# Patient Record
Sex: Female | Born: 1960
Health system: Southern US, Community
[De-identification: ages and names within clinical notes are randomized; demographics above are authoritative.]

## PROBLEM LIST (undated history)

## (undated) DIAGNOSIS — F329 Major depressive disorder, single episode, unspecified: Secondary | ICD-10-CM

## (undated) DIAGNOSIS — I10 Essential (primary) hypertension: Secondary | ICD-10-CM

## (undated) DIAGNOSIS — E039 Hypothyroidism, unspecified: Secondary | ICD-10-CM

## (undated) DIAGNOSIS — R319 Hematuria, unspecified: Secondary | ICD-10-CM

## (undated) DIAGNOSIS — D649 Anemia, unspecified: Secondary | ICD-10-CM

## (undated) DIAGNOSIS — Z8679 Personal history of other diseases of the circulatory system: Secondary | ICD-10-CM

## (undated) DIAGNOSIS — Z8669 Personal history of other diseases of the nervous system and sense organs: Secondary | ICD-10-CM

## (undated) DIAGNOSIS — G4730 Sleep apnea, unspecified: Secondary | ICD-10-CM

## (undated) DIAGNOSIS — Z8489 Family history of other specified conditions: Secondary | ICD-10-CM

## (undated) DIAGNOSIS — N201 Calculus of ureter: Secondary | ICD-10-CM

## (undated) DIAGNOSIS — K219 Gastro-esophageal reflux disease without esophagitis: Secondary | ICD-10-CM

## (undated) DIAGNOSIS — N200 Calculus of kidney: Secondary | ICD-10-CM

## (undated) DIAGNOSIS — M549 Dorsalgia, unspecified: Secondary | ICD-10-CM

## (undated) DIAGNOSIS — E079 Disorder of thyroid, unspecified: Secondary | ICD-10-CM

## (undated) DIAGNOSIS — Z87440 Personal history of urinary (tract) infections: Secondary | ICD-10-CM

## (undated) DIAGNOSIS — Z8719 Personal history of other diseases of the digestive system: Secondary | ICD-10-CM

## (undated) DIAGNOSIS — R7303 Prediabetes: Secondary | ICD-10-CM

## (undated) DIAGNOSIS — M7061 Trochanteric bursitis, right hip: Secondary | ICD-10-CM

## (undated) DIAGNOSIS — A159 Respiratory tuberculosis unspecified: Secondary | ICD-10-CM

## (undated) DIAGNOSIS — Z9109 Other allergy status, other than to drugs and biological substances: Secondary | ICD-10-CM

## (undated) DIAGNOSIS — J189 Pneumonia, unspecified organism: Secondary | ICD-10-CM

## (undated) DIAGNOSIS — D219 Benign neoplasm of connective and other soft tissue, unspecified: Secondary | ICD-10-CM

## (undated) DIAGNOSIS — G8929 Other chronic pain: Secondary | ICD-10-CM

## (undated) DIAGNOSIS — E119 Type 2 diabetes mellitus without complications: Secondary | ICD-10-CM

## (undated) DIAGNOSIS — R0600 Dyspnea, unspecified: Secondary | ICD-10-CM

## (undated) DIAGNOSIS — M7062 Trochanteric bursitis, left hip: Secondary | ICD-10-CM

## (undated) DIAGNOSIS — M069 Rheumatoid arthritis, unspecified: Secondary | ICD-10-CM

## (undated) DIAGNOSIS — F419 Anxiety disorder, unspecified: Secondary | ICD-10-CM

## (undated) DIAGNOSIS — F32A Depression, unspecified: Secondary | ICD-10-CM

## (undated) DIAGNOSIS — Z87442 Personal history of urinary calculi: Secondary | ICD-10-CM

## (undated) HISTORY — PX: OTHER SURGICAL HISTORY: SHX169

## (undated) HISTORY — DX: Benign neoplasm of connective and other soft tissue, unspecified: D21.9

## (undated) HISTORY — PX: HERNIA REPAIR: SHX51

## (undated) HISTORY — PX: TMJ ARTHROPLASTY: SHX1066

## (undated) HISTORY — DX: Anxiety disorder, unspecified: F41.9

## (undated) HISTORY — DX: Rheumatoid arthritis, unspecified: M06.9

## (undated) HISTORY — PX: CARDIAC CATHETERIZATION: SHX172

## (undated) HISTORY — PX: TOTAL ABDOMINAL HYSTERECTOMY: SHX209

## (undated) HISTORY — PX: CHOLECYSTECTOMY: SHX55

## (undated) HISTORY — DX: Hypothyroidism, unspecified: E03.9

## (undated) HISTORY — DX: Essential (primary) hypertension: I10

---
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
## 1988-10-24 HISTORY — PX: TMJ ARTHROPLASTY: SHX1066

## 1990-02-23 HISTORY — PX: KNEE ARTHROSCOPY: SUR90

## 1997-08-07 ENCOUNTER — Inpatient Hospital Stay (HOSPITAL_COMMUNITY): Admission: EM | Admit: 1997-08-07 | Discharge: 1997-08-10 | Payer: Self-pay | Admitting: Emergency Medicine

## 1997-08-13 ENCOUNTER — Encounter (HOSPITAL_COMMUNITY): Admission: RE | Admit: 1997-08-13 | Discharge: 1997-11-11 | Payer: Self-pay | Admitting: Psychiatry

## 1997-12-26 ENCOUNTER — Emergency Department (HOSPITAL_COMMUNITY): Admission: EM | Admit: 1997-12-26 | Discharge: 1997-12-26 | Payer: Self-pay | Admitting: Emergency Medicine

## 1997-12-26 ENCOUNTER — Encounter: Payer: Self-pay | Admitting: Emergency Medicine

## 1999-03-17 ENCOUNTER — Encounter: Admission: RE | Admit: 1999-03-17 | Discharge: 1999-03-17 | Payer: Self-pay | Admitting: Family Medicine

## 1999-03-17 ENCOUNTER — Encounter: Payer: Self-pay | Admitting: Family Medicine

## 1999-06-26 ENCOUNTER — Encounter: Admission: RE | Admit: 1999-06-26 | Discharge: 1999-06-26 | Payer: Self-pay | Admitting: Family Medicine

## 1999-06-26 ENCOUNTER — Encounter: Payer: Self-pay | Admitting: Family Medicine

## 1999-11-10 ENCOUNTER — Emergency Department (HOSPITAL_COMMUNITY): Admission: EM | Admit: 1999-11-10 | Discharge: 1999-11-10 | Payer: Self-pay | Admitting: Emergency Medicine

## 2000-03-25 ENCOUNTER — Other Ambulatory Visit: Admission: RE | Admit: 2000-03-25 | Discharge: 2000-03-25 | Payer: Self-pay | Admitting: Gynecology

## 2000-04-06 ENCOUNTER — Encounter: Payer: Self-pay | Admitting: Family Medicine

## 2000-04-06 ENCOUNTER — Encounter: Admission: RE | Admit: 2000-04-06 | Discharge: 2000-04-06 | Payer: Self-pay | Admitting: Family Medicine

## 2000-05-24 ENCOUNTER — Encounter: Payer: Self-pay | Admitting: Family Medicine

## 2000-05-24 ENCOUNTER — Encounter: Admission: RE | Admit: 2000-05-24 | Discharge: 2000-05-24 | Payer: Self-pay | Admitting: Family Medicine

## 2000-06-11 ENCOUNTER — Encounter: Payer: Self-pay | Admitting: Orthopedic Surgery

## 2000-06-11 ENCOUNTER — Encounter: Admission: RE | Admit: 2000-06-11 | Discharge: 2000-06-11 | Payer: Self-pay | Admitting: Orthopedic Surgery

## 2001-07-12 ENCOUNTER — Other Ambulatory Visit: Admission: RE | Admit: 2001-07-12 | Discharge: 2001-07-12 | Payer: Self-pay | Admitting: Gynecology

## 2001-12-14 ENCOUNTER — Encounter: Payer: Self-pay | Admitting: Family Medicine

## 2001-12-14 ENCOUNTER — Encounter: Admission: RE | Admit: 2001-12-14 | Discharge: 2001-12-14 | Payer: Self-pay | Admitting: Family Medicine

## 2001-12-17 ENCOUNTER — Emergency Department (HOSPITAL_COMMUNITY): Admission: EM | Admit: 2001-12-17 | Discharge: 2001-12-17 | Payer: Self-pay | Admitting: Emergency Medicine

## 2002-01-25 ENCOUNTER — Ambulatory Visit (HOSPITAL_COMMUNITY): Admission: RE | Admit: 2002-01-25 | Discharge: 2002-01-25 | Payer: Self-pay | Admitting: Family Medicine

## 2002-01-25 ENCOUNTER — Encounter: Payer: Self-pay | Admitting: Family Medicine

## 2002-03-01 ENCOUNTER — Ambulatory Visit (HOSPITAL_COMMUNITY): Admission: RE | Admit: 2002-03-01 | Discharge: 2002-03-01 | Payer: Self-pay | Admitting: Family Medicine

## 2002-03-01 ENCOUNTER — Encounter: Payer: Self-pay | Admitting: Family Medicine

## 2002-03-30 ENCOUNTER — Encounter: Payer: Self-pay | Admitting: Family Medicine

## 2002-03-30 ENCOUNTER — Encounter: Admission: RE | Admit: 2002-03-30 | Discharge: 2002-03-30 | Payer: Self-pay | Admitting: Family Medicine

## 2002-10-13 ENCOUNTER — Encounter: Payer: Self-pay | Admitting: Family Medicine

## 2002-10-13 ENCOUNTER — Encounter: Admission: RE | Admit: 2002-10-13 | Discharge: 2002-10-13 | Payer: Self-pay | Admitting: Family Medicine

## 2003-12-10 ENCOUNTER — Other Ambulatory Visit: Admission: RE | Admit: 2003-12-10 | Discharge: 2003-12-10 | Payer: Self-pay | Admitting: Gynecology

## 2003-12-28 ENCOUNTER — Emergency Department (HOSPITAL_COMMUNITY): Admission: EM | Admit: 2003-12-28 | Discharge: 2003-12-28 | Payer: Self-pay | Admitting: Emergency Medicine

## 2004-01-02 ENCOUNTER — Ambulatory Visit: Payer: Self-pay | Admitting: Pulmonary Disease

## 2004-01-30 ENCOUNTER — Ambulatory Visit: Payer: Self-pay | Admitting: Pulmonary Disease

## 2004-04-23 ENCOUNTER — Ambulatory Visit: Payer: Self-pay | Admitting: Pulmonary Disease

## 2004-06-20 ENCOUNTER — Encounter: Admission: RE | Admit: 2004-06-20 | Discharge: 2004-06-20 | Payer: Self-pay | Admitting: Family Medicine

## 2005-12-25 ENCOUNTER — Ambulatory Visit: Payer: Self-pay | Admitting: Pulmonary Disease

## 2006-06-15 ENCOUNTER — Encounter: Admission: RE | Admit: 2006-06-15 | Discharge: 2006-06-15 | Payer: Self-pay | Admitting: Family Medicine

## 2006-10-27 ENCOUNTER — Encounter: Admission: RE | Admit: 2006-10-27 | Discharge: 2006-10-27 | Payer: Self-pay | Admitting: Family Medicine

## 2006-10-28 ENCOUNTER — Ambulatory Visit: Payer: Self-pay

## 2006-11-30 ENCOUNTER — Ambulatory Visit: Payer: Self-pay | Admitting: Cardiology

## 2006-11-30 LAB — CONVERTED CEMR LAB
BUN: 19 mg/dL (ref 6–23)
Basophils Relative: 0.1 % (ref 0.0–1.0)
CO2: 26 meq/L (ref 19–32)
Eosinophils Relative: 3.9 % (ref 0.0–5.0)
GFR calc Af Amer: 100 mL/min
GFR calc non Af Amer: 83 mL/min
Glucose, Bld: 110 mg/dL — ABNORMAL HIGH (ref 70–99)
HCT: 36.1 % (ref 36.0–46.0)
Hemoglobin: 11.5 g/dL — ABNORMAL LOW (ref 12.0–15.0)
INR: 0.9 (ref 0.8–1.0)
MCHC: 31.8 g/dL (ref 30.0–36.0)
Monocytes Absolute: 0.1 10*3/uL — ABNORMAL LOW (ref 0.2–0.7)
Neutrophils Relative %: 68.9 % (ref 43.0–77.0)
Potassium: 4.3 meq/L (ref 3.5–5.1)
RDW: 15.6 % — ABNORMAL HIGH (ref 11.5–14.6)
Sodium: 142 meq/L (ref 135–145)

## 2006-12-07 ENCOUNTER — Inpatient Hospital Stay (HOSPITAL_BASED_OUTPATIENT_CLINIC_OR_DEPARTMENT_OTHER): Admission: RE | Admit: 2006-12-07 | Discharge: 2006-12-07 | Payer: Self-pay | Admitting: Cardiology

## 2006-12-07 ENCOUNTER — Ambulatory Visit: Payer: Self-pay | Admitting: Cardiology

## 2006-12-16 ENCOUNTER — Ambulatory Visit (HOSPITAL_COMMUNITY): Admission: RE | Admit: 2006-12-16 | Discharge: 2006-12-16 | Payer: Self-pay | Admitting: Cardiology

## 2006-12-21 ENCOUNTER — Ambulatory Visit: Payer: Self-pay | Admitting: Cardiology

## 2007-03-01 ENCOUNTER — Ambulatory Visit (HOSPITAL_COMMUNITY): Admission: RE | Admit: 2007-03-01 | Discharge: 2007-03-01 | Payer: Self-pay | Admitting: Surgery

## 2007-03-03 ENCOUNTER — Encounter: Admission: RE | Admit: 2007-03-03 | Discharge: 2007-03-03 | Payer: Self-pay | Admitting: Surgery

## 2007-03-03 ENCOUNTER — Ambulatory Visit (HOSPITAL_COMMUNITY): Admission: RE | Admit: 2007-03-03 | Discharge: 2007-03-03 | Payer: Self-pay | Admitting: Surgery

## 2007-07-06 ENCOUNTER — Encounter: Admission: RE | Admit: 2007-07-06 | Discharge: 2007-08-08 | Payer: Self-pay | Admitting: Surgery

## 2007-07-25 ENCOUNTER — Inpatient Hospital Stay (HOSPITAL_COMMUNITY): Admission: RE | Admit: 2007-07-25 | Discharge: 2007-07-28 | Payer: Self-pay | Admitting: Surgery

## 2007-07-25 HISTORY — PX: ROUX-EN-Y PROCEDURE: SUR1287

## 2007-07-26 ENCOUNTER — Encounter (INDEPENDENT_AMBULATORY_CARE_PROVIDER_SITE_OTHER): Payer: Self-pay | Admitting: Surgery

## 2007-07-26 ENCOUNTER — Ambulatory Visit: Payer: Self-pay | Admitting: Vascular Surgery

## 2008-01-11 ENCOUNTER — Other Ambulatory Visit: Admission: RE | Admit: 2008-01-11 | Discharge: 2008-01-11 | Payer: Self-pay | Admitting: Gynecology

## 2008-01-11 ENCOUNTER — Encounter: Payer: Self-pay | Admitting: Gynecology

## 2008-01-11 ENCOUNTER — Ambulatory Visit: Payer: Self-pay | Admitting: Gynecology

## 2008-02-13 ENCOUNTER — Encounter: Admission: RE | Admit: 2008-02-13 | Discharge: 2008-02-13 | Payer: Self-pay | Admitting: Family Medicine

## 2009-01-23 ENCOUNTER — Other Ambulatory Visit: Admission: RE | Admit: 2009-01-23 | Discharge: 2009-01-23 | Payer: Self-pay | Admitting: Gynecology

## 2009-01-23 ENCOUNTER — Ambulatory Visit: Payer: Self-pay | Admitting: Gynecology

## 2009-01-31 ENCOUNTER — Ambulatory Visit: Payer: Self-pay | Admitting: Gynecology

## 2009-01-31 ENCOUNTER — Other Ambulatory Visit: Admission: RE | Admit: 2009-01-31 | Discharge: 2009-01-31 | Payer: Self-pay | Admitting: Gynecology

## 2009-02-23 HISTORY — PX: SHOULDER ARTHROSCOPY: SHX128

## 2010-01-29 ENCOUNTER — Encounter
Admission: RE | Admit: 2010-01-29 | Discharge: 2010-01-29 | Payer: Self-pay | Source: Home / Self Care | Admitting: Orthopaedic Surgery

## 2010-02-13 ENCOUNTER — Ambulatory Visit: Payer: Self-pay | Admitting: Gynecology

## 2010-07-08 NOTE — Assessment & Plan Note (Signed)
[facility]ALTHCARE                            CARDIOLOGY OFFICE NOTE   Mary Herman, Mary Herman                     MRN:          [id number]  DATE:12/21/2006                            DOB:          [birth year]    PRIMARY CARE PHYSICIAN:  Dr. Mosetta Putt.   REASON FOR PRESENTATION:  Evaluate patient with dyspnea and chest  discomfort.   HISTORY OF PRESENT ILLNESS:  The patient is a pleasant 50 year old whose  recent history is extensively outlined in the October 7th note.  She had  a cardiac catheterization and an echocardiogram to try to evaluate her  dyspnea.  The echo demonstrated a well-preserved ejection fraction with  no significant abnormalities to identify the etiology of her shortness  of breath.  I did perform a cardiac catheterization with right heart  catheterization.  This demonstrated normal coronary arteries.  She had  normal left ventricular function.  She had a mildly increased end  diastolic left ventricular pressure, but her pressures were otherwise  fine.   The patient continues to have the dyspnea described previously.  She has  had no new symptoms.  She is not describing any chest discomfort.   PAST MEDICAL HISTORY:  1. Hyperlipidemia x3 years.  2. Diabetes mellitus x3 months.  3. Depression x30 years.  4. Obstructive sleep apnea on CPAP.  5. Hypothyroidism.  6. Morbid obesity.  7. C-section.  8. Cholecystectomy.  9. Knee surgery.  10.Bladder surgery.   ALLERGIES:  1. CODEINE.  2. PRILOSEC.   MEDICATIONS:  1. Levoxyl 100 mg daily.  2. Metformin 1000 mg b.i.d.  3. Diclofenac.  4. Aspirin 81 mg daily.  5. Bupropion.  6. Benzopyrine.  7. Fluoxetine 40 mg daily.  8. Metoclopramide.  9. Aciphex.  10.Lovastatin 20 mg daily.  11.Metoprolol 25 mg b.i.d.   REVIEW OF SYSTEMS:  As stated in the HPI, and otherwise negative for  other systems.   PHYSICAL EXAMINATION:  The patient is in no acute distress.  Blood pressure  124/90.  Heart rate 84 and regular.  Weight 358 pounds.  NECK:  No jugular venous distention at 45 degrees.  Carotid upstroke and  symmetric.  No bruits.  No thyromegaly.  LYMPHATICS:  No adenopathy.  LUNGS:  Clear to auscultation bilaterally.  HEART:  PMI not displaced or sustained.  S1 and S2 within normal limits.  No S3.  No murmurs.  ABDOMEN:  Obese.  Positive bowel sounds.  Normal in frequency and pitch.  No bruits.  No rebound.  No guarding.  No midline pulsatile mass.  No  organomegaly.  SKIN:  No rashes.  No nodules.  EXTREMITIES:  Two plus pulses.  No edema.   ASSESSMENT AND PLAN:  1. Dyspnea.  I do not have a clear etiology to the dyspnea.  I did      send her for pulmonary function testing.  If this comes back      normal, then I am going to suggest that her dyspnea is related to      her morbid obesity.  Certainly, she is cleared from a  cardiovascular standpoint for this surgery.  2. Followup will be as needed.     Rollene Rotunda, MD, [facility]  Electronically Signed    JH/MedQ  DD: 12/21/2006  DT: 12/22/2006  Job #: [id number]   cc:   Mosetta Putt, M.D.

## 2010-07-08 NOTE — Op Note (Signed)
Mary Herman              ACCOUNT NO.:  [id number]   MEDICAL RECORD NO.:  [id number]          PATIENT TYPE:  INP   LOCATION:  0001                         FACILITY:  [facility]   PHYSICIAN:  Thornton Park. Daphine Deutscher, MD  DATE OF BIRTH:  11/24/1960   DATE OF PROCEDURE:  07/25/2007  DATE OF DISCHARGE:                               OPERATIVE REPORT   PREOPERATIVE DIAGNOSIS:  Morbid obesity, BMI around 55.   POSTOPERATIVE DIAGNOSES:  1. Morbid obesity, BMI around 55.  2. Umbilical hernia.   PROCEDURE:  Laparoscopic Roux-en-Y gastric bypass (retrocolic  retrogastric candy cane to the left with 100 cm Roux limb and 40 cm  bypass limb), upper endoscopy by Dr. Colin Benton, closure of umbilical hernia  defect with primary suture.   SURGEON:  Luretha Murphy, M.D.   ASSISTANT:  Ovidio Kin, M.D. and Baruch Merl, M.D.   ANESTHESIA:  General endotracheal.   OPERATIVE TIME:  5 hours.   DESCRIPTION OF PROCEDURE:  Mary Herman is a 50 year old lady taken to  room 1 on July 25, 2007.  She was given general anesthesia.  The abdomen  was prepped with Techni-Care and draped sterilely.  The abdomen was  entered through the left upper quadrant using a 0 degrees 10 mm scope  without difficulty and was insufflated.  She had marked mesenteric fat,  marked omental fat and centripetal obesity.  Once insufflated, standard  trocar placements were made.  She did have umbilical hernia plugged with  omental fat and I went ahead and took that down with harmonic scalpel  and then there was a little place where she has had some previous pelvic  surgery and she had some fat stuck in that.  I took that down too with  Harmonic scalpel.  Prior to the end of the case, I actually put another  10 mm up in the upper abdomen from the standard and then I did repair  the umbilical defect at the end of the case to a smiling incision  infraumbilically.   In the meantime the omentum was elevated up over the liver.  I  identified the ligament Treitz.  I measured 40 cm down where I then  divided the small bowel with a 60 mm white cartridge.  A suture with a  Penrose was placed on the Roux limb and I measured off at least 100 cm  in length.  I then placed them side-by-side then did a side-to-side  jejunojejunostomy which went well.  The common defect was closed with 2-  0 Vicryl from either end after firing the stapler between the two.  Mesenteric defect was closed with running 2-0 silk and an anti-  obstruction stitch was placed.  I then divided the omentum.   Upper liver retractor was placed without difficulty.  I then I measured  about 5 cm down along the lesser curvature and created a window and then  was able to get into the lesser sac and then fired across with multiple  applications of the Covidien stapler.  I got a nice small pouch created  and I put some Tisseel from  above.   We then went down and got the Roux limb and began bringing it up and I  did a Roux maneuver including dividing the omentum completely.  I then  sutured it to the back wall and noticed that in doing and even just  holding it up, I had a little tear along the mesenteric border part of  the candy cane.  After sewing it in place, I next realized how much  tension it was under so I removed that back wall and the anastomosis  just did appear to be under marked tension.   We spent about 1-1/2 hours creating a window in the retrocolic space,  then bringing the Roux limb up through that and eventually resecting the  distal part of the candy cane where the mesenteric tear had occurred.  Once that was resected, I put that in a bag and I kept it off to the  side and it was brought out at the end of the case to the umbilicus.  The gastrojejunostomy was then performed creating a posterior wall again  with the candy cane to the left.  Common openings were made in the small  bowel and then the stomach and then the anastomosis was made  with the 45  stapler.  The common opening was closed with 2-0 Vicryl and then a  second layer was placed this time with an Ewald tube across the  anastomosis.  That was a free suture of 2-0 Vicryl with Laparoties at  either end.   When this was complete, I then went back down and closed Peterson's  defect from either side with 2-0 silks on both the right and left side  to fix the mesentery to the bowel.  Once this was completed, we clamped  it at that point and then Dr. Colin Benton endoscoped the patient revealing a  small pouch and no evidence of a leak.   I then made an incision infraumbilically entering this small umbilical  hernia and then extracted the bag with the small bowel in it and then  closed that defect under laparoscopic vision with a pursestring suture  of #1 Novofil.  The abdomen was surveyed and everything seemed to be in  order.  Trocars were all removed.  The wounds were closed 4-0 Vicryl and  staples.  The patient taken to recovery room in satisfactory condition.      Thornton Park Daphine Deutscher, MD  Electronically Signed     MBM/MEDQ  D:  07/25/2007  T:  07/25/2007  Job:  [id number]   cc:   Mosetta Putt, M.D.  Fax: [phone]

## 2010-07-08 NOTE — Cardiovascular Report (Signed)
NAMEED, RAYSON NO.:  [id number]   MEDICAL RECORD NO.:  [id number]          PATIENT TYPE:  OIB   LOCATION:  1961                         FACILITY:  [facility]   PHYSICIAN:  Rollene Rotunda, MD, FACCDATE OF BIRTH:  05-28-1960   DATE OF PROCEDURE:  12/07/2006  DATE OF DISCHARGE:                            CARDIAC CATHETERIZATION   PRIMARY CARE PHYSICIAN:  Mosetta Putt, M.D.   PROCEDURE:  1. Left and right heart catheterization.  2. Coronary arteriography.   INDICATIONS:  Evaluate patient with dyspnea and chest discomfort.   PROCEDURE NOTE:  Left heart catheterization performed via right femoral  artery, right heart catheterization performed via the right femoral  vein.  Both vessels were cannulated using an anterior wall puncture.  A  #4-French arterial sheath was inserted via the modified Seldinger  technique; #7-French venous sheath was inserted also via the modified  Seldinger technique.  Preformed Judkins, pigtail and a Swan-Ganz  catheter were utilized.  The patient tolerated the procedure well and  left the lab in stable condition.   RESULTS:   HEMODYNAMICS:  RA mean 14, RV 33/9, PA 26/12, mean of 19, pulmonary  capillary pressure mean 18, LV 131/15, AO 134/104. Cardiac  output/cardiac index (Fick) 4.8/1.96, cardiac output/cardiac index  (thermodilution) 6.9/2.82. Systemic vascular resistance 1483 (Fick).  Pulmonary vascular resistance 16.69 (Fick).   CORONARIES:  Left main was normal though short.   The LAD was normal, wrapping the apex.  First diagonal was small and  normal.   The circumflex in the AV groove was small and normal.  The first obtuse  marginal was large and normal.  The second obtuse marginal was large and  normal.   The right coronary artery is a large dominant vessel.  It was normal  throughout its course.  There was a moderate size PDA which was normal.   LEFT VENTRICULOGRAM:  Left ventriculogram was obtained in the RAO  projection.  The EF was 65% with normal wall motion.   CONCLUSION:  1. Normal coronary arteries.  2. Normal left ventricular function.  3. Mildly increased end-diastolic pressure.   PLAN:  The patient has no coronary abnormalities and well-preserved  ejection fraction.  Her end-diastolic and wedge pressure are very  slightly elevated, indicating some component of diastolic dysfunction.  However, her  dyspnea seems out of proportion to this.  I think the next step would be  pulmonary function testing and possibly consideration of a  cardiopulmonary stress test.  Of note, I did do saturations in the  superior vena cava and pulmonary artery and found no step-up to suggest  a shunt.      Rollene Rotunda, MD, [facility]  Electronically Signed     JH/MEDQ  D:  12/07/2006  T:  12/07/2006  Job:  [id number]   cc:   Mosetta Putt, M.D.

## 2010-07-08 NOTE — Assessment & Plan Note (Signed)
[facility] HEALTHCARE                            CARDIOLOGY OFFICE NOTE   SIANI, UTKE                     MRN:          [id number]  DATE:11/30/2006                            DOB:          Aug 06, 1960    PRIMARY CARE PHYSICIAN:  Mosetta Putt, M.D.   REASON FOR PRESENTATION:  Evaluate patient with dyspnea and chest  discomfort.   HISTORY OF PRESENT ILLNESS:  The patient is a pleasant 50 year old white  female without prior cardiac history.  She does have significant  cardiovascular risk factors.  She is being considered for possible  bariatric surgery.  She is being referred for preoperative evaluation,  also because she has had progressive dyspnea.  This has been progressive  over a year.  It has been particularly bad in the last few weeks.  She  is now short of breath walking on level ground 20 yards.  She does not  describe resting shortness of breath and does not have any PND or  orthopnea.  She does get chest discomfort, substernal pressure, when she  ambulates.  This goes away when she stops what she is doing.  She has to  wait a few minutes to get her breath back.  She has had an  electrocardiogram which demonstrates poor anterior R-wave progression  with probable anteroseptal infarction.  The R-waves are less prominent  than they were on electrocardiogram in 2003.  Of note, she did have an  echocardiogram, ordered by Dr. Duaine Dredge, in our office that demonstrates  the ejection fraction to be 55%-60% with overall preserved systolic  function.  The patient does not have jaw or arm discomfort when she  ambulates.  She does not describe associated nausea, vomiting or  diaphoresis.  She does not have any palpitations, pre-syncope or  syncope.  She had not been having this chest discomfort prior to this.   PAST MEDICAL HISTORY:  1. Hyperlipidemia x3 years.  2. Diabetes mellitus x3 months.  3. Depression x30 years.  4. Obstructive sleep apnea on  CPAP.  5. Hypothyroidism.   PAST SURGICAL HISTORY:  1. A C-section.  2. A cholecystectomy.  3. Knee surgery.  4. Bladder surgery.   ALLERGIES:  CODEINE AND PRILOSEC.   CURRENT MEDICATIONS:  1. Lovastatin 20 mg daily.  2. Aciphex 20 mg daily.  3. Metoclopramide.  4. Fluoxetine 40 mg daily.  5. Aspirin 81 mg daily.  6. Diazepam 5 mg daily p.r.n.  7. Levoxyl 100 mcg daily.  8. Diclofenac.  9. Hydrocodone 5/500 mg.  10.Metformin 500 mg twice daily.  11.Bupropion 150 mg daily.  12.Cyclobenzaprine.   SOCIAL HISTORY:  The patient works in data entry.  She is married.  She  has one 46 year old child.  She does not smoke cigarettes and never has.  She occasionally drinks alcohol.   FAMILY HISTORY:  Noncontributory for early coronary disease.   REVIEW OF SYSTEMS:  As stated in the HPI and positive for diarrhea,  reflux, joint pains, lower extremity swelling.  Negative for all other  systems.   PHYSICAL EXAMINATION:  GENERAL:  The patient is in no  acute distress.  VITAL SIGNS:  Blood pressure 149/101, heart rate 104, weight 353 pounds,  body mass index 62.  HEENT:  Eyes unremarkable.  Pupils equal, round, reactive to light.  Fundi within normal limits.  Oral mucosa unremarkable.  NECK:  No jugular venous distention at 45 degrees.  Carotid upstroke  brisk and symmetric.  No bruits, no thyromegaly.  LYMPHATICS:  No cervical, axillary or inguinal adenopathy.  LUNGS:  Clear to auscultation bilaterally.  BACK:  No costovertebral angle tenderness.  CHEST:  Unremarkable.  HEART:  PMI not displaced or sustained.  S1 and S2 within normal limits.  No S3, no S4, no clicks, rubs or murmurs.  ABDOMEN:  Obese, with positive bowel sounds.  Normal in frequency and  pitch.  No bruits, no rebound,  guarding, or midline pulsatile mass,  no  hepatomegaly or splenomegaly.  SKIN:  No rashes, no nodules.  EXTREMITIES:  With 2+ pulses throughout.  Trace bilateral lower  extremity edema.  No  cyanosis or clubbing.  NEUROLOGIC:  Oriented to person, place and time.  Cranial nerves II-XII  grossly intact.  Motor grossly intact.   Electrocardiogram:  Sinus rhythm, rate 100, anteroseptal infarction, no  acute ST-T wave changes.   ASSESSMENT/PLAN:  1. The patient has progressive significant dyspnea along with chest      discomfort that would represent an unstable angina equivalent:  She      has evidence suggestive of an old anteroseptal infarction on the      electrocardiogram.  There is no wall motion abnormality on the      echocardiogram.  At this point I think the most prudent next step,      given the high pre-test probability of obstructive coronary      disease, is a right and left heart catheterization.  In the      meantime, I am going to start a low-dose beta blocker.  She will be      given sublingual nitroglycerin.  2. Dyslipidemia:  She will have this continued to be followed by Dr.      Duaine Dredge.  The goal, given her diabetes, should be an LDL of less      than 100 and HDL greater than 50.  3. Morbid obesity:  We will discuss her cardiovascular risk for      bariatric surgery after the above study.   FOLLOWUP:  Followup will be at the time of the cardiac catheterization.     Rollene Rotunda, MD, [facility]  Electronically Signed    JH/MedQ  DD: 11/30/2006  DT: 12/01/2006  Job #: [id number]   cc:   Mosetta Putt, M.D.

## 2010-07-11 NOTE — Discharge Summary (Signed)
NAMEKAITLIN, ALCINDOR              ACCOUNT NO.:  [id number]   MEDICAL RECORD NO.:  [id number]          PATIENT TYPE:  INP   LOCATION:  1233                         FACILITY:  [facility]   PHYSICIAN:  Thornton Park. Daphine Deutscher, MD  DATE OF BIRTH:  1961-01-13   DATE OF ADMISSION:  07/25/2007  DATE OF DISCHARGE:  07/28/2007                               DISCHARGE SUMMARY   ADMISSION DATE:  Morbid obesity, BMI 55 with ventral hernia.   PROCEDURE:  July 25, 2007 was laparoscopic Roux-en-Y gastric bypass,  upper endoscopy, repair of umbilical hernia (bypass was retrocolic,  retrogastric).   COURSE IN HOSPITAL:  Aliya Sol underwent the above mentioned  operation and did well.  A swallow showed good emptying, she was  advanced and ready for discharge on postoperative day #3.  She was given  Roxicet Elixir to take for pain.  Arrangements were made for followup in  the office.      Thornton Park Daphine Deutscher, MD  Electronically Signed     MBM/MEDQ  D:  08/31/2007  T:  08/31/2007  Job:  [id number]

## 2010-10-22 DIAGNOSIS — E785 Hyperlipidemia, unspecified: Secondary | ICD-10-CM | POA: Insufficient documentation

## 2010-10-22 DIAGNOSIS — I10 Essential (primary) hypertension: Secondary | ICD-10-CM | POA: Insufficient documentation

## 2010-10-24 ENCOUNTER — Ambulatory Visit (INDEPENDENT_AMBULATORY_CARE_PROVIDER_SITE_OTHER): Payer: BC Managed Care – PPO | Admitting: Gynecology

## 2010-10-24 ENCOUNTER — Encounter: Payer: Self-pay | Admitting: Gynecology

## 2010-10-24 ENCOUNTER — Other Ambulatory Visit (HOSPITAL_COMMUNITY)
Admission: RE | Admit: 2010-10-24 | Discharge: 2010-10-24 | Disposition: A | Discharge status: Home / Self Care | Payer: BC Managed Care – PPO | Source: Ambulatory Visit | Attending: Gynecology | Admitting: Gynecology

## 2010-10-24 VITALS — BP 130/82 | Ht 63.25 in | Wt 266.0 lb

## 2010-10-24 DIAGNOSIS — Z01419 Encounter for gynecological examination (general) (routine) without abnormal findings: Secondary | ICD-10-CM

## 2010-10-24 DIAGNOSIS — N926 Irregular menstruation, unspecified: Secondary | ICD-10-CM

## 2010-10-24 NOTE — Progress Notes (Signed)
Mary Herman 1960-05-24 [id number]        50 y.o.  for annual exam.  Patient notes of the past year her periods have progressively gotten more irregular. She usually has a period every month but sometimes then we'll have breakthrough bleeding in mid month or longer periods last up to 2 weeks. She's not having hot flushes night sweats. No significant weight changes she is status post bypass although has regained some of her weight.  Past medical history,surgical history, medications, allergies, family history and social history were all reviewed and documented in the EPIC chart. ROS:  Was performed and pertinent positives and negatives are included in the history.  Exam: chaperone present Filed Vitals:   10/24/10 1539  BP: 130/82   General appearance  Normal Skin grossly normal Head/Neck normal with no cervical or supraclavicular adenopathy thyroid normal Lungs  clear Cardiac RR, without RMG Abdominal  soft, nontender, without masses, organomegaly or hernia Breasts  examined lying and sitting without masses, retractions, discharge or axillary adenopathy. Pelvic  Ext/BUS/vagina  normal   Cervix  normal  Pap done  Uterus  anteverted, normal size, shape and contour, midline and mobile nontender   Adnexa  Without masses or tenderness    Anus and perineum  normal   Rectovaginal  normal sphincter tone without palpated masses or tenderness.    Assessment/Plan:  50 y.o. female for annual exam.    #1 Irregular menses. We'll start with basic work to include TSH FSH prolactin. Order sonohysterogram rule out structural abnormalities such as polyps or myomas. Patient will followup to discuss her lab results when she comes for her sonohysterogram. #2 Health maintenance. Self breast exams on a monthly basis discussed encouraged. She is overdue for mammogram and she knows importance to schedule this and agrees to do so. Husband had vasectomy although she relates that they're not currently sexually  active. No baseline blood work was done as it's all done through her primary who follows her for her medical issues.    Dara Lords MD, 4:32 PM 10/24/2010

## 2010-10-29 ENCOUNTER — Telehealth: Payer: Self-pay | Admitting: Gynecology

## 2010-10-29 DIAGNOSIS — N926 Irregular menstruation, unspecified: Secondary | ICD-10-CM

## 2010-10-29 NOTE — Telephone Encounter (Signed)
Lm for pt to call

## 2010-10-29 NOTE — Telephone Encounter (Signed)
Tell patient FSH came back a little low on repeated full thyroid panel at her convenience.

## 2010-11-07 NOTE — Telephone Encounter (Signed)
Lm for pt to call

## 2010-11-07 NOTE — Telephone Encounter (Signed)
Pt informed with the below note. 

## 2010-11-12 ENCOUNTER — Ambulatory Visit: Payer: BC Managed Care – PPO | Admitting: Gynecology

## 2010-11-12 ENCOUNTER — Inpatient Hospital Stay: Admission: RE | Admit: 2010-11-12 | Payer: BC Managed Care – PPO | Source: Ambulatory Visit

## 2010-11-19 LAB — BASIC METABOLIC PANEL
BUN: 11
CO2: 29
Calcium: 9
Creatinine, Ser: 0.87
GFR calc non Af Amer: >60
Sodium: 139

## 2010-11-19 LAB — HEMOGLOBIN AND HEMATOCRIT, BLOOD
HCT: 36.3
Hemoglobin: 12

## 2010-11-20 LAB — CBC
HCT: 32.7 — ABNORMAL LOW
HCT: 33.3 — ABNORMAL LOW
Hemoglobin: 10.8 — ABNORMAL LOW
Hemoglobin: 11.2 — ABNORMAL LOW
MCHC: 32.9
MCV: 79.1
RBC: 4.05
RBC: 4.21
RDW: 15.2
WBC: 8.9

## 2010-11-20 LAB — DIFFERENTIAL
Basophils Relative: 1
Eosinophils Absolute: 0
Eosinophils Relative: 0
Eosinophils Relative: 0
Lymphocytes Relative: 8 — ABNORMAL LOW
Lymphs Abs: 0.7
Monocytes Absolute: 0.4
Monocytes Absolute: 0.6
Monocytes Relative: 4
Monocytes Relative: 7
Neutro Abs: 6.3

## 2010-11-20 LAB — HEMOGLOBIN AND HEMATOCRIT, BLOOD
HCT: 37
Hemoglobin: 11.3 — ABNORMAL LOW

## 2010-11-20 LAB — PREGNANCY, URINE: Preg Test, Ur: NEGATIVE

## 2010-12-03 LAB — BLOOD GAS, ARTERIAL
Acid-base deficit: 1.3
O2 Saturation: 97.8
TCO2: 24.1
pCO2 arterial: 38.9
pO2, Arterial: 97.2

## 2010-12-04 ENCOUNTER — Ambulatory Visit: Payer: BC Managed Care – PPO

## 2010-12-04 ENCOUNTER — Ambulatory Visit (INDEPENDENT_AMBULATORY_CARE_PROVIDER_SITE_OTHER): Payer: BC Managed Care – PPO | Admitting: Gynecology

## 2010-12-04 ENCOUNTER — Ambulatory Visit: Payer: BC Managed Care – PPO | Admitting: Gynecology

## 2010-12-04 ENCOUNTER — Ambulatory Visit (INDEPENDENT_AMBULATORY_CARE_PROVIDER_SITE_OTHER): Payer: BC Managed Care – PPO | Admitting: *Deleted

## 2010-12-04 ENCOUNTER — Other Ambulatory Visit: Payer: Self-pay | Admitting: Gynecology

## 2010-12-04 ENCOUNTER — Encounter: Payer: Self-pay | Admitting: Gynecology

## 2010-12-04 VITALS — BP 130/70

## 2010-12-04 DIAGNOSIS — D3910 Neoplasm of uncertain behavior of unspecified ovary: Secondary | ICD-10-CM

## 2010-12-04 DIAGNOSIS — N949 Unspecified condition associated with female genital organs and menstrual cycle: Secondary | ICD-10-CM

## 2010-12-04 DIAGNOSIS — N926 Irregular menstruation, unspecified: Secondary | ICD-10-CM

## 2010-12-04 DIAGNOSIS — R1903 Right lower quadrant abdominal swelling, mass and lump: Secondary | ICD-10-CM

## 2010-12-04 DIAGNOSIS — N83209 Unspecified ovarian cyst, unspecified side: Secondary | ICD-10-CM

## 2010-12-04 DIAGNOSIS — N938 Other specified abnormal uterine and vaginal bleeding: Secondary | ICD-10-CM

## 2010-12-04 LAB — POCT I-STAT 3, VENOUS BLOOD GAS (G3P V)
Acid-base deficit: 5 — ABNORMAL HIGH
Bicarbonate: 21.4
Bicarbonate: 26.3 — ABNORMAL HIGH
O2 Saturation: 62
O2 Saturation: 66
TCO2: 28
pCO2, Ven: 48
pH, Ven: 7.347 — ABNORMAL HIGH
pO2, Ven: 37

## 2010-12-04 LAB — POCT I-STAT 3, ART BLOOD GAS (G3+)
Acid-base deficit: 1
Bicarbonate: 24.3 — ABNORMAL HIGH
O2 Saturation: 95
TCO2: 26

## 2010-12-04 NOTE — Telephone Encounter (Signed)
Addended byCammie Mcgee T on: 12/04/2010 02:49 PM   Modules accepted: Orders

## 2010-12-04 NOTE — Progress Notes (Signed)
Patient presents with sonohysterogram due to history of intermenstrual bleeding. Had TSH which was mildly low and is having a thyroid panel repeated today.  Ultrasound shows anteverted uterus, homogeneous myometrial pattern. Right ovary with a thin walled cystic mass 27 mm with a solid papillary nodule measuring 17 mm. Left ovary was not identified. Sonohysterogram performed sterile technique easy catheter introduction good distention no abnormalities. An endometrial sample taken.  Assessment and plan: 1. Small ovarian cyst right ovary with papillations. I think actually the papillation is probably a small hemorrhagic change. Have recommended repeat ultrasound in 2-3 months. If persistent or enlarged discuss possible surgery in the various possibilities up to and including ovarian cancer. If results and will follow as a physiologic change. 2. Intermenstrual bleeding. I think this is probably more dysfunctional due to hormonal changes.  Patient will follow up for biopsy results. Assuming negative then recommended menstrual calendar and recheck in 2-3 months and she fills up for her ultrasound. If abnormal then will triaged based on results. She'll also follow up for TSH results and if abnormal then again will triaged based on results.

## 2010-12-04 NOTE — Patient Instructions (Signed)
follow up ultrasound 2-3 months

## 2010-12-10 ENCOUNTER — Encounter: Payer: Self-pay | Admitting: Gynecology

## 2010-12-25 ENCOUNTER — Telehealth: Payer: Self-pay | Admitting: *Deleted

## 2010-12-25 NOTE — Telephone Encounter (Signed)
Pt called stating her insurance will be changing in Jan 2013. She was seen in office and had ultrasound done and found a small cyst on her right ovary. Per TF he would like to recheck ultrasound 2-3 months if any enlargement or persistent they would talk about possible surgery. Pt wanted to know if she would have to have surgery. I explained to pt that this why he wanted to recheck in 2-3 months to see if any growth from cyst. He would not be able to tell now if she needed surgery. Pt was okay with this and will return back to office for u/s recheck in 2-3 months.

## 2011-01-19 ENCOUNTER — Telehealth (INDEPENDENT_AMBULATORY_CARE_PROVIDER_SITE_OTHER): Payer: Self-pay | Admitting: Surgery

## 2011-01-19 NOTE — Telephone Encounter (Signed)
01/19/11 mailed recall to patient for bariatric surgery follow-up. Adv pt to call [facility] to schedule an appt...cef °

## 2011-03-04 ENCOUNTER — Other Ambulatory Visit: Payer: Self-pay | Admitting: Gynecology

## 2011-03-04 DIAGNOSIS — N83209 Unspecified ovarian cyst, unspecified side: Secondary | ICD-10-CM

## 2011-03-17 ENCOUNTER — Encounter (INDEPENDENT_AMBULATORY_CARE_PROVIDER_SITE_OTHER): Payer: Self-pay | Admitting: General Surgery

## 2011-03-18 ENCOUNTER — Other Ambulatory Visit: Payer: BC Managed Care – PPO

## 2011-03-18 ENCOUNTER — Encounter: Payer: Self-pay | Admitting: Gynecology

## 2011-03-18 ENCOUNTER — Ambulatory Visit (INDEPENDENT_AMBULATORY_CARE_PROVIDER_SITE_OTHER): Payer: BC Managed Care – PPO | Admitting: Surgery

## 2011-03-18 ENCOUNTER — Ambulatory Visit: Payer: BC Managed Care – PPO | Admitting: Gynecology

## 2011-03-18 ENCOUNTER — Ambulatory Visit (INDEPENDENT_AMBULATORY_CARE_PROVIDER_SITE_OTHER): Payer: BC Managed Care – PPO

## 2011-03-18 ENCOUNTER — Ambulatory Visit (INDEPENDENT_AMBULATORY_CARE_PROVIDER_SITE_OTHER): Payer: BC Managed Care – PPO | Admitting: Gynecology

## 2011-03-18 DIAGNOSIS — N83209 Unspecified ovarian cyst, unspecified side: Secondary | ICD-10-CM

## 2011-03-18 DIAGNOSIS — N898 Other specified noninflammatory disorders of vagina: Secondary | ICD-10-CM

## 2011-03-18 DIAGNOSIS — Z9884 Bariatric surgery status: Secondary | ICD-10-CM

## 2011-03-18 DIAGNOSIS — N8300 Follicular cyst of ovary, unspecified side: Secondary | ICD-10-CM

## 2011-03-18 DIAGNOSIS — N926 Irregular menstruation, unspecified: Secondary | ICD-10-CM

## 2011-03-18 DIAGNOSIS — L293 Anogenital pruritus, unspecified: Secondary | ICD-10-CM

## 2011-03-18 LAB — WET PREP FOR TRICH, YEAST, CLUE

## 2011-03-18 MED ORDER — FLUCONAZOLE 200 MG PO TABS: 200.0000 mg | ORAL_TABLET | Freq: Every day | ORAL | Status: AC

## 2011-03-18 MED ORDER — NYSTATIN-TRIAMCINOLONE 100000-0.1 UNIT/GM-% EX OINT: TOPICAL_OINTMENT | Freq: Two times a day (BID) | CUTANEOUS | Status: AC

## 2011-03-18 NOTE — Patient Instructions (Signed)
Keep menstrual calendar. Follow up in you're due for your annual exam or sooner if irregular menses return.

## 2011-03-18 NOTE — Patient Instructions (Signed)
See Mary Herman Low Carbs for now...remember

## 2011-03-18 NOTE — Progress Notes (Signed)
Patient presents with 3 issues: #1 vulvar itching that has been going on for months. No real discharge. #2 follow up on ovarian cyst seen at sonohysterogram ultrasound October 2012 measuring 26 mm on the right ovary with a solid papillary nodule. #3 follow up on a regular menses which is why we did the sonohysterogram in October. Her sonohysterogram and biopsy were negative and she was to keep a menstrual calendar.  Ultrasound shows endometrial echo at 4.4 mm. Uterus with normal echotexture. Right ovary normal with resolution of the prior seen cyst.  Left ovary with normal physiologic changes. Cul-de-sac negative.  Exam was Sherrilyn Rist chaperone present External BUS vagina with white discharge. Cervix normal. Uterus normal size midline mobile nontender. Adnexa without masses or tenderness.  Assessment and plan: #1 vulvar pruritus. Her wet prep is negative. I suspect she is having a cutaneous yeast. We'll treat with Diflucan 200 mg daily for 5 days and Mytrex cream externally when necessary. Patient will follow up if her symptoms persist or recur. #2 right ovarian cyst. Her ultrasound is normal today. No special follow up needed. #3 irregular menses. She notes actually that her menses have been regular since the sonohistogram and biopsy with monthly menses. Patient will keep menstrual calendar. Assuming they remain normal then we'll follow up when she's due for her annual. Sooner if they become irregular.

## 2011-03-18 NOTE — Progress Notes (Signed)
Addended by: Latricia Heft on: 03/18/2011 05:49 PM   Modules accepted: Orders

## 2011-03-18 NOTE — Progress Notes (Signed)
Mary Herman 51 y.o.  There is no height or weight on file to calculate BMI.  Patient Active Problem List  Diagnoses  . Hypertension  . Hypothyroid  . Hyperlipidemia    Allergies  Allergen Reactions  . Codeine Rash    Past Surgical History  Procedure Date  . Cesarean section 1998  . Tmj arthroplasty     jaw broken and reset??  . Cholecystectomy   . Gastric bypass 07/2007   Mary Herman, Mary A, MD No diagnosis found.  Mary Herman returns today in followup after her retro colic retro  gastric bypass.  She is successfully resolved her diabetes mellitus, her cholesterol elevation, and her hypertension. Her joint health is much better. She has lost her primary care physician. She has had some weight regain so we will see her back in 4 months and in the meantime we'll get her over to see Royal Hawthorn again her back on the low carb diet. Matt B. Daphine Deutscher, MD, [facility] [phone] beeper [phone]  03/18/2011 5:36 PM

## 2011-05-26 ENCOUNTER — Encounter (INDEPENDENT_AMBULATORY_CARE_PROVIDER_SITE_OTHER): Payer: Self-pay | Admitting: Surgery

## 2011-07-09 ENCOUNTER — Encounter: Payer: BC Managed Care – PPO | Attending: Surgery | Admitting: *Deleted

## 2011-07-09 DIAGNOSIS — Z9884 Bariatric surgery status: Secondary | ICD-10-CM | POA: Insufficient documentation

## 2011-07-09 DIAGNOSIS — Z09 Encounter for follow-up examination after completed treatment for conditions other than malignant neoplasm: Secondary | ICD-10-CM | POA: Insufficient documentation

## 2011-07-09 DIAGNOSIS — Z713 Dietary counseling and surveillance: Secondary | ICD-10-CM | POA: Insufficient documentation

## 2011-07-09 NOTE — Patient Instructions (Signed)
Goals:  Follow Phase 3B: High Protein + Non-Starchy Vegetables  Eat 3-6 small meals/snacks, every 3-5 hrs  Increase lean protein foods to meet 60-80g goal  Increase fluid intake to 64oz +  Add 15 grams of carbohydrate (fruit, whole grain, starchy vegetable) with meals  Avoid drinking 15 minutes before, during and 30 minutes after eating  Aim for >30 min of physical activity daily  

## 2011-07-10 ENCOUNTER — Encounter: Payer: Self-pay | Admitting: *Deleted

## 2011-07-10 NOTE — Progress Notes (Addendum)
  Post-Operative RYGB Surgery Follow-up Visit   Medical Nutrition Therapy:  Appt start time: 1545 end time: 1620.  Primary concerns today: Post-operative bariatric surgery nutrition management. Mary Herman returns 4 yrs s/p RYGB surgery.  Reports she lost her mom 2 yrs ago, causing her to spiral out of control with weight gain. Reports increased intake of sweets d/t watching husband "eat whatever he wants and it's hard".  Also states she can eat a normal meal now without fullness.  Drinking excessive amounts of sweet tea and is not taking all supplements per guidelines. Samples given. Pt has likely stretched pouch; advised restart of pre-op diet, then move to modified bariatric surgery post-op diet. Will see back in 3 mos.   Surgery date: 07/25/2007 Start weight: 361.0 lbs  Weight today: 278.5 lbs (reports lowest weight of 219 lbs after surgery) Total weight lost: 82.5 lbs BMI: 49.3 kg/m^2 Weight goal: 220 lbs % Weight goal met: 56%  TANITA  BODY COMP RESULTS  07/09/11   %Fat 49.4%   Fat Mass (lbs) 137.5   Fat Free Mass (lbs) 141.0   Total Body Water (lbs) 103.0   24-hr recall:  B (AM): Egg, 3 pcs bacon on bun; sweet tea Snk (AM): Tom's peanut bar OR nabs (6 pk)   L (PM): Carrots w/ 1000 island dressing (2 Tbsp) OR ham & cheese sandwich Snk (PM): same as a.m. snack  D (PM): 6" individual frozen pizza OR skips Snk (PM): 16-20 oz sweet tea, (2) regular ice cream sandwiches  Fluid intake: 50-60 oz Estimated total protein intake: 50-70g  Medications: See medication list; reconciled with pt.  Supplementation: Not taking calcium citrate or B-12 for the last 6 mos. Compliant with MVI.   Using straws: No Drinking while eating: Yes Hair loss: Yes - more lately (likely d/t decreased pro intake) Carbonated beverages: No N/V/D/C: Increased diarrhea in last 1.5 mos Dumping syndrome: No  Recent physical activity:  None  Progress Towards Goal(s):  In progress.  Handouts given during visit  include:  2 Week Pre-Op Diet   Bariatric Specialized Post-Op Diet  Bariatric Protein Shakes  Bariatric Fast Food Guide   Samples dispensed:  Celebrate Vitamins:   Vit D3 5000 Quickmelts: 3 ea - Lot # [id number]; Exp: 01/15  Sublingual B-12: 5 ea - Lot # [id number]; Exp: 07/14  Opurity:   1000 mcg B-12: 2 ea - Lot # [id number]; Exp: 02/15  Calcium/Mg/D3: 2 ea - Lot # [id number]; Exp: 11/14  Vit D: 2 ea - Lot # [id number]; Exp: 02/15  Unjury Protein Powder:   Unflavored: 1 ea - Lot # [id number]; Exp: 05/14  Chocolate Splendor: 1 ea - Lot # [id number]; Exp: 05/14  Nutritional Diagnosis:  NI-5.7.1 Inadequate protein intake related to h/o RYGB surgery as evidenced by patient meeting only 80% of protein goal and increased hair loss.    Intervention:  Nutrition education/reinforcement.  Monitoring/Evaluation:  Dietary intake, exercise, and body weight. Follow up in 3 months.

## 2011-08-03 ENCOUNTER — Encounter: Payer: Self-pay | Admitting: Physical Medicine & Rehabilitation

## 2011-08-07 ENCOUNTER — Encounter: Payer: BC Managed Care – PPO | Attending: Physical Medicine & Rehabilitation

## 2011-08-07 ENCOUNTER — Encounter: Payer: Self-pay | Admitting: Physical Medicine & Rehabilitation

## 2011-08-07 ENCOUNTER — Ambulatory Visit (HOSPITAL_BASED_OUTPATIENT_CLINIC_OR_DEPARTMENT_OTHER): Payer: BC Managed Care – PPO | Admitting: Physical Medicine & Rehabilitation

## 2011-08-07 VITALS — BP 143/90 | HR 84 | Resp 16 | Ht 64.0 in | Wt 280.0 lb

## 2011-08-07 DIAGNOSIS — M1710 Unilateral primary osteoarthritis, unspecified knee: Secondary | ICD-10-CM

## 2011-08-07 DIAGNOSIS — M79609 Pain in unspecified limb: Secondary | ICD-10-CM | POA: Insufficient documentation

## 2011-08-07 DIAGNOSIS — M7062 Trochanteric bursitis, left hip: Secondary | ICD-10-CM

## 2011-08-07 DIAGNOSIS — R52 Pain, unspecified: Secondary | ICD-10-CM

## 2011-08-07 DIAGNOSIS — M76899 Other specified enthesopathies of unspecified lower limb, excluding foot: Secondary | ICD-10-CM

## 2011-08-07 NOTE — Progress Notes (Signed)
Subjective:    Patient ID: Mary Herman, female    DOB: December 27, 1960, 51 y.o.   MRN: [id number]  HPI Patient with a nine-month history of leg pain. Pain is in the thighs and the knees legs and feet. Has had workup including arthritis profile which showed an elevated rheumatoid factor but has no diagnosis of rheumatoid arthritis thus far. Has had MRI of the lumbar spine but no results are available yet.reportedly this did not show any significant abnormalities. A pelvic MRI is now scheduled Doppler studies done in December of 2012 were negative. Standing makes her pain worse. Still has some pain at night as well. Pain Inventory Average Pain 4 Pain Right Now 5 My pain is intermittent and aching  In the last 24 hours, has pain interfered with the following? General activity 4 Relation with others 4 Enjoyment of life 7 What TIME of day is your pain at its worst? varies Sleep (in general) Fair  Pain is worse with: walking, bending, sitting and standing Pain improves with: rest and medication Relief from Meds: 9  Mobility walk without assistance ability to climb steps?  yes do you drive?  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  no  Physicians involved in your care Any changes since last visit?  no   Family History  Problem Relation Age of Onset  . Hypertension Mother   . Stroke Mother   . Hypertension Brother   . Heart disease Maternal Uncle   . Diabetes Maternal Grandmother   . Cancer Maternal Grandmother     THROAT CA  . Cancer Maternal Grandfather     THROAT CA   History   Social History  . Marital Status: Married    Spouse Name: N/A    Number of Children: N/A  . Years of Education: N/A   Social History Main Topics  . Smoking status: Never Smoker   . Smokeless tobacco: Never Used  . Alcohol Use: No  . Drug Use: No  .  Sexually Active: Not Currently -- Female partner(s)    Birth Control/ Protection: Surgical     husband vasectomy   Other Topics Concern  . None   Social History Narrative  . None   Past Surgical History  Procedure Date  . Cesarean section 1998  . Tmj arthroplasty     jaw broken and reset??  . Cholecystectomy   . Gastric bypass 07/2007   Past Medical History  Diagnosis Date  . Hypertension   . Hypothyroid   . Hyperlipidemia   . Anxiety    BP 143/90  Pulse 84  Resp 16  Ht 5\' 4"  (1.626 m)  Wt 280 lb (127.007 kg)  BMI 48.06 kg/m2  SpO2 98%    Review of Systems  Constitutional: Positive for unexpected weight change.  Musculoskeletal:       Leg pain  Neurological: Positive for tremors.  Psychiatric/Behavioral: Positive for dysphoric mood. The patient is nervous/anxious.   All other systems reviewed and are negative.       Objective:   Physical Exam  Constitutional: She is oriented to person, place, and time. She appears well-developed.       Morbid obesity  Eyes: Conjunctivae and EOM are normal. Pupils are equal, round, and reactive to light.  Neck: Normal range of motion.  Musculoskeletal: She exhibits tenderness.  Right hip: She exhibits decreased range of motion and tenderness. She exhibits normal strength and no deformity.       Left hip: She exhibits tenderness. She exhibits normal range of motion, normal strength, no swelling and no deformity.       Lumbar back: She exhibits decreased range of motion. She exhibits no tenderness, no bony tenderness, no swelling, no edema, no deformity and no spasm.       Tenderness over the trochanteric bursa bilaterally  Palpation of fibromyalgia tender points is negative    Neurological: She is alert and oriented to person, place, and time. She has normal reflexes. She displays no atrophy. No cranial nerve deficit or sensory deficit. She exhibits normal muscle tone. Coordination normal.  Psychiatric: She has a normal  mood and affect. Her behavior is normal. Judgment and thought content normal.          Assessment & Plan:  1. Trochanteric bursitis bilateral hips. She is sedentary and likely has tightness of bothTFL's, will inject today and then sent to physical therapy. 2. Ostial dryness both knees. I think she could benefit from aquatic exercise program. She will explore this at the [facility]. She has just joined the [employer]. She'll continue to followup with orthopedics in regards to corticosteroid injection in the knees

## 2011-08-07 NOTE — Patient Instructions (Signed)
You'll get your physical therapy at [facility]. You'll see me back in one month if the injection was not  helpful will need to do it under ultrasound guidance

## 2011-08-12 ENCOUNTER — Telehealth: Payer: Self-pay | Admitting: Physical Medicine & Rehabilitation

## 2011-08-12 NOTE — Telephone Encounter (Signed)
Hips hurt worse now than they did before injections, please call.

## 2011-08-13 NOTE — Telephone Encounter (Signed)
Please advise. Pt recently saw Dr. Wynn Banker and he did an injection in her hip. Thanks.

## 2011-08-13 NOTE — Telephone Encounter (Signed)
LM with to letting her know about this.

## 2011-08-13 NOTE — Telephone Encounter (Signed)
She has to make an appointment with Dr. Doroteo Bradford

## 2011-09-17 ENCOUNTER — Ambulatory Visit: Payer: BC Managed Care – PPO | Attending: Physical Medicine & Rehabilitation | Admitting: Physical Therapy

## 2011-09-18 ENCOUNTER — Ambulatory Visit: Payer: BC Managed Care – PPO | Admitting: Physical Medicine & Rehabilitation

## 2011-10-08 ENCOUNTER — Ambulatory Visit: Payer: BC Managed Care – PPO | Admitting: *Deleted

## 2012-11-08 ENCOUNTER — Ambulatory Visit: Payer: Commercial Managed Care - PPO | Admitting: Internal Medicine

## 2012-11-11 ENCOUNTER — Ambulatory Visit (INDEPENDENT_AMBULATORY_CARE_PROVIDER_SITE_OTHER): Payer: Commercial Managed Care - PPO | Admitting: Internal Medicine

## 2012-11-11 ENCOUNTER — Encounter: Payer: Self-pay | Admitting: Internal Medicine

## 2012-11-11 ENCOUNTER — Other Ambulatory Visit: Payer: Self-pay | Admitting: Orthopaedic Surgery

## 2012-11-11 VITALS — BP 136/98 | HR 96 | Temp 98.6°F | Ht 63.5 in | Wt 273.0 lb

## 2012-11-11 DIAGNOSIS — F329 Major depressive disorder, single episode, unspecified: Secondary | ICD-10-CM

## 2012-11-11 DIAGNOSIS — J309 Allergic rhinitis, unspecified: Secondary | ICD-10-CM

## 2012-11-11 DIAGNOSIS — M545 Low back pain: Secondary | ICD-10-CM

## 2012-11-11 DIAGNOSIS — Z9884 Bariatric surgery status: Secondary | ICD-10-CM

## 2012-11-11 DIAGNOSIS — E039 Hypothyroidism, unspecified: Secondary | ICD-10-CM

## 2012-11-11 DIAGNOSIS — F341 Dysthymic disorder: Secondary | ICD-10-CM

## 2012-11-11 DIAGNOSIS — M549 Dorsalgia, unspecified: Secondary | ICD-10-CM

## 2012-11-11 DIAGNOSIS — R319 Hematuria, unspecified: Secondary | ICD-10-CM

## 2012-11-11 LAB — POCT URINALYSIS DIPSTICK
Bilirubin, UA: NEGATIVE
Glucose, UA: NEGATIVE
Nitrite, UA: NEGATIVE
pH, UA: 6

## 2012-11-12 ENCOUNTER — Encounter: Payer: Self-pay | Admitting: Internal Medicine

## 2012-11-12 DIAGNOSIS — Z9884 Bariatric surgery status: Secondary | ICD-10-CM | POA: Insufficient documentation

## 2012-11-12 LAB — URINALYSIS, MICROSCOPIC ONLY: Casts: NONE SEEN

## 2012-11-12 LAB — URINALYSIS, ROUTINE W REFLEX MICROSCOPIC
Specific Gravity, Urine: 1.025 (ref 1.005–1.030)
Urobilinogen, UA: 1 mg/dL (ref 0.0–1.0)

## 2012-11-12 NOTE — Patient Instructions (Addendum)
Await results of urine microscopic evaluation and culture. May need dedicated CT with stone protocol to see if you have kidney stones. Proceed with MRI of LS-spine on Sunday, September 21 per Dr. Magnus Ivan. We are waiting for records from your previous physician Dr. Fransisco Beau in [facility]

## 2012-11-12 NOTE — Progress Notes (Signed)
Subjective:    Patient ID: Mary Herman, female    DOB: 24-Apr-1960, 52 y.o.   MRN: [id number]  HPI First visit for this 52 year old white female from [place] who is employed by [employer]. 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 Patient is here today for evaluation of back pain which she says radiates at times around to her groin area. Think she has seen some blood in her urine. Describes her urine as brown at times. Thinks she may have kidney stones. Recently she went to see Dr. Gwynneth Munson physician assistant regarding some back pain and is scheduled to have an MRI on Sunday, September 21.  She has a history of allergic rhinitis. She is allergic to cats and has cats in her home.  History of depression  History of gastric bypass surgery done in 2009 by Dr. Daphine Deutscher.  History of interstitial cystitis in the remote past.  Patient says some 3 months ago she started noticing her urine being dark brown at times. Drinks a lot of iced tea.  Recently her left leg popped and she had pain in her thigh area.  History of TMJ syndrome. Had arthroscopic surgery on left knee some 25 years ago. Had cholecystectomy approximately 18 years ago. Cesarean section 1998.  Codeine causes a rash.  Patient had tetanus immunization done through [facility] in June 2014. She will receive influenza vaccine through [facility].  Social history: Patient is married. Husband is disabled. She does not smoke or consume alcohol. She has one daughter age 16 who resides at home with patient and her husband. Patient says daughter has had 9 operations on her femur. Patient has high school education.  Family history: Father was deceased at age 32. Had a history of alcoholism. Cause of death not known to patient. Mother with history of brain aneurysm 10 years prior to her death. She died at age 47 with history of stroke and had lung disease. No sisters. One brother age 8 with  hypertension. One brother age 13 whose health she does not know about.    Review of Systems  Constitutional: Positive for fatigue.  HENT: Positive for congestion.   Gastrointestinal: Negative.        History of gastric bypass surgery  Endocrine:       History of hypothyroidism  Genitourinary:       No dysuria  Musculoskeletal: Positive for back pain.       Left leg pain. Pain is in lower back not CVA  Allergic/Immunologic: Positive for environmental allergies.  Psychiatric/Behavioral:       History of depression       Objective:   Physical Exam  Vitals reviewed. Constitutional: She is oriented to person, place, and time. She appears well-developed and well-nourished.  Ambulating with cane  Neck: No thyromegaly present.  Cardiovascular: Normal rate, regular rhythm and normal heart sounds.   Pulmonary/Chest: Effort normal and breath sounds normal. No respiratory distress.  Musculoskeletal:  No CVA tenderness.  Neurological: She is alert and oriented to person, place, and time.  Skin: Skin is warm and dry.  Psychiatric: She has a normal mood and affect. Her behavior is normal. Judgment and thought content normal.          Assessment & Plan:  Low back pain-to have MRI of LS-spine on Sunday, September 21 per Dr. Magnus Ivan. Currently on hydrocodone and methocarbamol for pain  ? Hematuria. Dipstick urine shows 4+ occult blood. Urine to be  sent for microscopic evaluation at lab as well as culture even though she has no dysuria.  History of hypothyroidism. History of gastric bypass surgery  History of depression  Plan: Evaluation of urine as above. Further recommendations to follow results of microscopic UA and culture. She may need to have CT of the kidneys was done protocol if they're significant hematuria. She has no recent history of sore throat to suggest glomerulonephritis.  She drinks a lot of tea and is at high risk for kidney stones.

## 2012-11-13 ENCOUNTER — Ambulatory Visit
Admission: RE | Admit: 2012-11-13 | Discharge: 2012-11-13 | Disposition: A | Discharge status: Home / Self Care | Payer: 59 | Source: Ambulatory Visit | Attending: Orthopaedic Surgery | Admitting: Orthopaedic Surgery

## 2012-11-13 DIAGNOSIS — M545 Low back pain: Secondary | ICD-10-CM

## 2012-11-13 LAB — URINE CULTURE: Colony Count: 40000

## 2012-11-14 NOTE — Progress Notes (Signed)
Patient notified. She states she just this weekend got her menses, which she hasn't had for 4 months. Is currently on pain medicine for her leg, so is unsure of any other pain. Not currently having any blood in her urine. Will call back if she feels bad again, or has hematuria. Dr. Lenord Fellers aware

## 2012-11-17 ENCOUNTER — Other Ambulatory Visit (HOSPITAL_COMMUNITY)
Admission: RE | Admit: 2012-11-17 | Discharge: 2012-11-17 | Disposition: A | Discharge status: Home / Self Care | Payer: 59 | Source: Ambulatory Visit | Attending: Internal Medicine | Admitting: Internal Medicine

## 2012-11-17 ENCOUNTER — Encounter: Payer: Self-pay | Admitting: Internal Medicine

## 2012-11-17 ENCOUNTER — Ambulatory Visit (INDEPENDENT_AMBULATORY_CARE_PROVIDER_SITE_OTHER): Payer: Commercial Managed Care - PPO | Admitting: Internal Medicine

## 2012-11-17 VITALS — BP 120/88 | HR 88 | Temp 98.1°F | Wt 273.0 lb

## 2012-11-17 DIAGNOSIS — R310 Gross hematuria: Secondary | ICD-10-CM

## 2012-11-17 DIAGNOSIS — R319 Hematuria, unspecified: Secondary | ICD-10-CM | POA: Insufficient documentation

## 2012-11-17 DIAGNOSIS — M549 Dorsalgia, unspecified: Secondary | ICD-10-CM

## 2012-11-17 LAB — CBC WITH DIFFERENTIAL/PLATELET
Basophils Absolute: 0 10*3/uL (ref 0.0–0.1)
Basophils Relative: 0 % (ref 0–1)
Hemoglobin: 14.1 g/dL (ref 12.0–15.0)
MCHC: 33.7 g/dL (ref 30.0–36.0)
Neutro Abs: 4.2 10*3/uL (ref 1.7–7.7)
Neutrophils Relative %: 62 % (ref 43–77)
RDW: 13.9 % (ref 11.5–15.5)

## 2012-11-17 LAB — POCT URINALYSIS DIPSTICK
Bilirubin, UA: NEGATIVE
Ketones, UA: NEGATIVE
Nitrite, UA: NEGATIVE
Urobilinogen, UA: NEGATIVE
pH, UA: 5.5

## 2012-11-17 LAB — COMPREHENSIVE METABOLIC PANEL
ALT: 21 U/L (ref 0–35)
BUN: 15 mg/dL (ref 6–23)
CO2: 29 mEq/L (ref 19–32)
Calcium: 8.9 mg/dL (ref 8.4–10.5)
Chloride: 103 mEq/L (ref 96–112)
Creat: 0.79 mg/dL (ref 0.50–1.10)
Glucose, Bld: 117 mg/dL — ABNORMAL HIGH (ref 70–99)
Sodium: 140 mEq/L (ref 135–145)
Total Bilirubin: 0.8 mg/dL (ref 0.3–1.2)
Total Protein: 6.8 g/dL (ref 6.0–8.3)

## 2012-11-17 NOTE — Progress Notes (Signed)
Subjective:    Patient ID: Mary Herman, female    DOB: 18-Feb-1961, 52 y.o.   MRN: [id number]  HPI  52 year old white female was here last week with complaint of hematuria. However urine specimen microscopic only showed 7-10 red blood cells per high-power field and urine culture was negative. She had an MRI of the LS-spine ordered by Dr. Eliberto Ivory PA which proved to be negative. She still having low back pain. Think she is having recurrent hematuria. She's been no menstrual period for the past several days and finds it difficult to tell. Says she's been having hematuria for 3 months intermittently. Think she has kidney stones. Says pain radiates from her lower back around to her groin area bilaterally. Still the pain is not excruciating and is relieved with hydrocodone/APAP 5/325.  Cath specimen obtained today shows gross hematuria. Urine was sent for cytology.  Also received her records from Dr. Louis Meckel at [facility] in [place]. Patient was seen there with hematuria 07/27/2012. She was treated with Cipro for 7 days. Renal ultrasound was recommended the patient refused due to cost. Urinalysis at that time showed occult blood on urine dipstick but color of urine was described as dark yellow. Urine culture revealed growth.  Patient has a history of chronic lumbar back pain and was seen for that January 2014 at [facility]. Pain radiated to her left and right hip. Has been treated with Celebrex in the past.  Last pelvic exam August 2012 by records hemoglobin A1c January 2014 5.2% was seen there in November 2013. Recent EKG had showed poor R-wave progression in precordial leads. Patient was without symptoms of chest pain or dyspnea. Echocardiogram performed was technically difficult but not felt to have LVH. Balance appeared to be normal. Also was seen there August 2013 with bilateral leg pain. Was treated with Toradol and Depo-Medrol had lower extremity arterial study  bilaterally which was negative  History of anxiety depression and hypothyroidism  Allergic to codeine it causes a rash  Family history: Hypertension in mother and brother and hyperlipidemia in mother. Anxiety and depression and brother.  Nonsmoker. Does not consume alcohol.  History of positive rheumatoid factor 44.1 with normal up to 13.9 done through [facility] in 2013. ANA was negative at that time. Low density study done 2013 indicated T scores normal and lumbar spine and femur. Chest x-ray 2013 was normal  Prior to being seen at [facility] she had been followed by   [facility] in [facility]. Was seen there in may 2013 with bilateral leg pain. Pain was described as radiating down both legs. She did see a rheumatologist and apparently was told she did not have rheumatoid arthritis. And negative Doppler study for DVT December 2012. No history of elevated uric acid. Has had normal B12 levels. Vitamin D has been low at 28 and 2012. Had negative MRI of the LS-spine and 2013. Had normal MRI of the pelvis and hips 2013.  Saw rheumatologist to [facility] January 2013 and was suspected of having fibromyalgia and possibly spinal stenosis  MRI done this past Sunday at [facility] showed no evidence of neural impingement or spinal stenosis  Patient has a history of gastric bypass surgery. Patient says she has had amenorrhea for several months and just recently started her menstrual period several days ago.    Review of Systems     Objective:   Physical Exam Does not have palpable tenderness in the lower LS-spine. Straight  leg raising is negative at 90 bilaterally. There is blood in the vaginal vault. In and out quick cath urine revealed grossly bloody urine.       Assessment & Plan:  Hematuria-is to have  CT with contrast as soon as possible to see if she has mass in her kidney. She also could have bladder lesion causing hematuria.  Low back  pain-which has been present for some time upon review of old medical records. Rheumatoid factor positive at titer is low and rheumatologist did not think she had rheumatoid arthritis. I do not see where CCP has been drawn. ANA was negative. Pain is relieved with hydrocodone/APAP/5/325 new prescription today for #60 with no refill.  History of bilateral leg pain-has had MRI of the hips that showed no evidence of avascular necrosis  History of gastric bypass surgery  Obesity  Hypothyroidism  Anxiety depression

## 2012-11-17 NOTE — Patient Instructions (Addendum)
To have     CT with contrast to see if there is a kidney mass present. Take hydrocodone/APAP sparingly for back pain

## 2012-11-18 ENCOUNTER — Ambulatory Visit
Admission: RE | Admit: 2012-11-18 | Discharge: 2012-11-18 | Disposition: A | Discharge status: Home / Self Care | Payer: 59 | Source: Ambulatory Visit | Attending: Internal Medicine | Admitting: Internal Medicine

## 2012-11-18 MED ORDER — IOHEXOL 300 MG/ML  SOLN
125.0000 mL | Freq: Once | INTRAMUSCULAR | Status: AC | PRN
  Administered 2012-11-18: 125 mL via INTRAVENOUS

## 2012-11-22 ENCOUNTER — Other Ambulatory Visit: Payer: Self-pay | Admitting: Urology

## 2012-12-07 ENCOUNTER — Ambulatory Visit (INDEPENDENT_AMBULATORY_CARE_PROVIDER_SITE_OTHER): Payer: Commercial Managed Care - PPO | Admitting: Internal Medicine

## 2012-12-07 DIAGNOSIS — Z23 Encounter for immunization: Secondary | ICD-10-CM

## 2012-12-20 ENCOUNTER — Encounter (HOSPITAL_BASED_OUTPATIENT_CLINIC_OR_DEPARTMENT_OTHER): Payer: Self-pay | Admitting: *Deleted

## 2012-12-20 NOTE — Progress Notes (Signed)
NPO AFTER MN. ARRIVES AT 0815. NEEDS HG . MAY TAKE HYDROCODONE IF NEEDED AM DOS W/ SIPS OF WATER.

## 2012-12-23 NOTE — H&P (Signed)
History of Present Illness   Mary Herman presents today as a referral from Dr. Baxley for further assessment of a recently diagnosed nephrolithiasis/ureteral calculus. Mary Herman is currently 51 years of age. Her only prior urologic history was an assessment by Dr. Robert Evans years ago for possible interstitial cystitis, but it does not appear she has had any ongoing difficulties in that arena. She has no prior history of nephrolithiasis but does have a family history. For months she has been experiencing some intermittent nonspecific bilateral low-back discomfort and also some intermittent hematuria. She postponed the evaluation due to some insurance changes. Recently she has been experiencing bilateral low-back pain but, interestingly, it has been primarily on the left side. She did recently undergo an appropriate CT of her abdomen and pelvis with and without contrast. This did demonstrate a 6 mm nonobstructing stone in the lower to mid pole of the left kidney. On the right side, there was a 5 mm stone in the right mid ureter, producing some mild hydronephrosis. Those images were taken just a few days ago. Her urine pH has been in the 5.5 to 6.0 range. Other significant history includes a previous gastric bypass.      Past Medical History Problems  1. History of  Anxiety (Symptom) 300.00 2. History of  Arthritis V13.4 3. History of  Depression 311 4. History of  Hyperthyroidism 242.90  Surgical History Problems  1. History of  Cesarean Section 2. History of  Gastric Surgery For Morbid Obesity Gastric Bypass 3. History of  Jaw Surgery 4. History of  Knee Arthroscopy 5. History of  Shoulder Arthroscopy  Current Meds 1. Singulair TABS; Therapy: (Recorded:30Sep2014) to 2. Xyzal TABS; Therapy: (Recorded:30Sep2014) to  Allergies Medication  1. Codeine Derivatives  Family History Problems  1. Fraternal history of  Depression 2. Family history of  Family Health Status Number Of Children 1  daughter 3. Family history of  Hematuria 4. Family history of  Mother Deceased At Age 70 5. Family history of  Nephrolithiasis 6. Fraternal history of  Nephrolithiasis 7. 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  Genitourinary: hematuria.  Constitutional: feeling tired (fatigue).  ENT: sinus problems.  Hematologic/Lymphatic: a tendency to easily bruise.  Musculoskeletal: back pain and joint pain.  Psychiatric: depression and anxiety.    Vitals BMI Calculated: 48.12 BSA Calculated: 2.22 Height: 5 ft 3.5 in Weight: 275 lb  Blood Pressure: 130 / 87 Temperature: 98 F Heart Rate: 106  Physical Exam Constitutional: Well nourished and well developed . No acute distress.  ENT:. The ears and nose are normal in appearance.  Neck: The appearance of the neck is normal and no neck mass is present.  Pulmonary: No respiratory distress and normal respiratory rhythm and effort.  Cardiovascular: Heart rate and rhythm are normal . No peripheral edema.  Abdomen: The abdomen is soft and nontender. No masses are palpated. No CVA tenderness. No hernias are palpable. No hepatosplenomegaly noted.  Skin: Normal skin turgor, no visible rash and no visible skin lesions.  Neuro/Psych:. Mood and affect are appropriate.    Results/Data Urine [Data Includes: Last 1 Day]     30Sep2014 COLOR YELLOW  APPEARANCE CLEAR  SPECIFIC GRAVITY 1.025  pH 6.0  GLUCOSE NEG mg/dL BILIRUBIN NEG  KETONE NEG mg/dL BLOOD LARGE  PROTEIN TRACE mg/dL UROBILINOGEN 0.2  mg/dL NITRITE NEG  LEUKOCYTE ESTERASE TRACE  SQUAMOUS EPITHELIAL/HPF FEW  WBC 3-6 WBC/hpf RBC 21-50 RBC/hpf BACTERIA MODERATE  CRYSTALS Calcium Oxalate crystals noted  CASTS NONE SEEN   Procedure  On KUB today I cannot definitively visualize either the left renal calculus or the right ureteral calculus.   Assessment Assessed  1. Ureteral Stone 592.1 2. Nephrolithiasis 592.0  Plan Health Maintenance (V70.0)  1. UA With REFLEX  Done: 30Sep2014 09:59AM Nephrolithiasis (592.0)  2. Tamsulosin HCl 0.4 MG Oral Capsule; TAKE 1 CAPSULE EVERY DAY; Therapy:  30Sep2014 to (Last Rx:30Sep2014)  Requested for: 30Sep2014; Edited 3. KUB  Done: 30Sep2014 12:00AM 4. Follow-up Schedule Surgery Office  Follow-up  Requested for: 30Sep2014  Discussion/Summary   Mary Herman has had several months of intermittent hematuria and some bilateral flank discomfort. She clearly has a 5-6 mm stone in the right mid ureter. What is unclear is how long that has actually been in her ureter. She does not have high-grade obstruction, but certainly if the stone has been in there for that length of time, it is time to consider definitive intervention. Statistically, she has a 40-50% chance of passing the stone but, again, if it has been in there more than 4-6 weeks, those odds are substantially lower. There is some question as to whether this might be uric acid nephrolithiasis. I cannot see the stone well at all on KUB imaging. Urine pH is in the 5 to 5-6 range, which his a little high typically for uric acid nephrolithiasis. I suspect these are poorly visualized calcium stones, but it is difficult to say with certainty. I do not have a good explanation why she is having pain on the left side. That stone appears to be nonobstructing and her left-sided back pain may be musculoskeletal and unrelated. At this point, I have proposed that we consider cystoscopy with bilateral retrograde pyelography, right-sided ureteroscopy with Holmium  laser lithotripsy and possible stent placement. If for some reason the left stone has migrated in the ureter, then we can consider definitive management of that stone as well. Otherwise, we would need the left-sided stone for a later time. If this does turn out to be uric acid nephrolithiasis, we could conceivably dissolve the stone on the left. She is not a candidate for ESWL, given the lack of visibility on KUB imaging today.  

## 2012-12-26 ENCOUNTER — Ambulatory Visit (HOSPITAL_BASED_OUTPATIENT_CLINIC_OR_DEPARTMENT_OTHER): Payer: 59 | Admitting: Anesthesiology

## 2012-12-26 ENCOUNTER — Ambulatory Visit (HOSPITAL_BASED_OUTPATIENT_CLINIC_OR_DEPARTMENT_OTHER)
Admission: RE | Admit: 2012-12-26 | Discharge: 2012-12-26 | Disposition: A | Discharge status: Home / Self Care | Payer: 59 | Source: Ambulatory Visit | Attending: Urology | Admitting: Urology

## 2012-12-26 ENCOUNTER — Encounter (HOSPITAL_BASED_OUTPATIENT_CLINIC_OR_DEPARTMENT_OTHER)
Admission: RE | Disposition: A | Discharge status: Home / Self Care | Payer: Self-pay | Source: Ambulatory Visit | Attending: Urology

## 2012-12-26 ENCOUNTER — Encounter (HOSPITAL_BASED_OUTPATIENT_CLINIC_OR_DEPARTMENT_OTHER): Payer: 59 | Admitting: Anesthesiology

## 2012-12-26 ENCOUNTER — Encounter (HOSPITAL_BASED_OUTPATIENT_CLINIC_OR_DEPARTMENT_OTHER): Payer: Self-pay | Admitting: Anesthesiology

## 2012-12-26 ENCOUNTER — Ambulatory Visit (HOSPITAL_COMMUNITY): Payer: 59

## 2012-12-26 DIAGNOSIS — N1330 Unspecified hydronephrosis: Secondary | ICD-10-CM | POA: Insufficient documentation

## 2012-12-26 DIAGNOSIS — E0590 Thyrotoxicosis, unspecified without thyrotoxic crisis or storm: Secondary | ICD-10-CM | POA: Insufficient documentation

## 2012-12-26 DIAGNOSIS — Z9884 Bariatric surgery status: Secondary | ICD-10-CM | POA: Insufficient documentation

## 2012-12-26 DIAGNOSIS — E039 Hypothyroidism, unspecified: Secondary | ICD-10-CM | POA: Insufficient documentation

## 2012-12-26 DIAGNOSIS — N201 Calculus of ureter: Secondary | ICD-10-CM

## 2012-12-26 DIAGNOSIS — N200 Calculus of kidney: Secondary | ICD-10-CM | POA: Insufficient documentation

## 2012-12-26 DIAGNOSIS — I10 Essential (primary) hypertension: Secondary | ICD-10-CM | POA: Insufficient documentation

## 2012-12-26 HISTORY — PX: HOLMIUM LASER APPLICATION: SHX5852

## 2012-12-26 HISTORY — DX: Hematuria, unspecified: R31.9

## 2012-12-26 HISTORY — DX: Calculus of ureter: N20.1

## 2012-12-26 HISTORY — DX: Trochanteric bursitis, right hip: M70.61

## 2012-12-26 HISTORY — DX: Personal history of other diseases of the digestive system: Z87.19

## 2012-12-26 HISTORY — DX: Calculus of kidney: N20.0

## 2012-12-26 HISTORY — DX: Dorsalgia, unspecified: M54.9

## 2012-12-26 HISTORY — DX: Other chronic pain: G89.29

## 2012-12-26 HISTORY — DX: Other allergy status, other than to drugs and biological substances: Z91.09

## 2012-12-26 HISTORY — PX: CYSTOSCOPY WITH STENT PLACEMENT: SHX5790

## 2012-12-26 HISTORY — PX: CYSTOSCOPY WITH RETROGRADE PYELOGRAM, URETEROSCOPY AND STENT PLACEMENT: SHX5789

## 2012-12-26 HISTORY — DX: Personal history of other diseases of the nervous system and sense organs: Z86.69

## 2012-12-26 HISTORY — DX: Trochanteric bursitis, left hip: M70.62

## 2012-12-26 HISTORY — DX: Personal history of urinary (tract) infections: Z87.440

## 2012-12-26 HISTORY — DX: Personal history of other diseases of the circulatory system: Z86.79

## 2012-12-26 SURGERY — CYSTOURETEROSCOPY, WITH RETROGRADE PYELOGRAM AND STENT INSERTION
Anesthesia: General | Site: Ureter | Laterality: Right | Wound class: Clean Contaminated

## 2012-12-26 MED ORDER — METOCLOPRAMIDE HCL 5 MG/ML IJ SOLN
INTRAMUSCULAR | Status: DC | PRN
  Administered 2012-12-26: 10 mg via INTRAVENOUS

## 2012-12-26 MED ORDER — MIDAZOLAM HCL 5 MG/5ML IJ SOLN
INTRAMUSCULAR | Status: DC | PRN
  Administered 2012-12-26: 2 mg via INTRAVENOUS

## 2012-12-26 MED ORDER — LACTATED RINGERS IV SOLN
INTRAVENOUS | Status: DC
  Administered 2012-12-26 (×2): via INTRAVENOUS
  Filled 2012-12-26: qty 1000

## 2012-12-26 MED ORDER — LIDOCAINE HCL (CARDIAC) 20 MG/ML IV SOLN
INTRAVENOUS | Status: DC | PRN
  Administered 2012-12-26: 100 mg via INTRAVENOUS

## 2012-12-26 MED ORDER — HYDROCODONE-ACETAMINOPHEN 5-325 MG PO TABS
1.0000 | ORAL_TABLET | Freq: Four times a day (QID) | ORAL | Status: DC | PRN
  Administered 2012-12-26: 1 via ORAL
  Filled 2012-12-26: qty 2

## 2012-12-26 MED ORDER — PROMETHAZINE HCL 25 MG/ML IJ SOLN
6.2500 mg | INTRAMUSCULAR | Status: DC | PRN
  Administered 2012-12-26: 12.5 mg via INTRAVENOUS
  Filled 2012-12-26: qty 1

## 2012-12-26 MED ORDER — ONDANSETRON HCL 4 MG/2ML IJ SOLN
INTRAMUSCULAR | Status: DC | PRN
  Administered 2012-12-26: 4 mg via INTRAVENOUS

## 2012-12-26 MED ORDER — URIBEL 118 MG PO CAPS: 1.0000 | ORAL_CAPSULE | Freq: Three times a day (TID) | ORAL | Status: DC | PRN

## 2012-12-26 MED ORDER — SODIUM CHLORIDE 0.9 % IR SOLN
Status: DC | PRN
  Administered 2012-12-26: 6000 mL

## 2012-12-26 MED ORDER — IOHEXOL 350 MG/ML SOLN
INTRAVENOUS | Status: DC | PRN
  Administered 2012-12-26: 10 mL

## 2012-12-26 MED ORDER — HYDROCODONE-ACETAMINOPHEN 5-325 MG PO TABS: 1.0000 | ORAL_TABLET | Freq: Four times a day (QID) | ORAL | Status: DC | PRN

## 2012-12-26 MED ORDER — KETOROLAC TROMETHAMINE 30 MG/ML IJ SOLN
INTRAMUSCULAR | Status: DC | PRN
  Administered 2012-12-26: 30 mg via INTRAVENOUS

## 2012-12-26 MED ORDER — NITROFURANTOIN MACROCRYSTAL 100 MG PO CAPS: 100.0000 mg | ORAL_CAPSULE | Freq: Every day | ORAL | Status: DC

## 2012-12-26 MED ORDER — FENTANYL CITRATE 0.05 MG/ML IJ SOLN
INTRAMUSCULAR | Status: DC | PRN
  Administered 2012-12-26 (×4): 50 ug via INTRAVENOUS

## 2012-12-26 MED ORDER — LIDOCAINE HCL 2 % EX GEL
CUTANEOUS | Status: DC | PRN
  Administered 2012-12-26: 1 via URETHRAL

## 2012-12-26 MED ORDER — CEFAZOLIN SODIUM-DEXTROSE 2-3 GM-% IV SOLR
2.0000 g | INTRAVENOUS | Status: AC
  Administered 2012-12-26: 2 g via INTRAVENOUS
  Filled 2012-12-26: qty 50

## 2012-12-26 MED ORDER — FENTANYL CITRATE 0.05 MG/ML IJ SOLN
25.0000 ug | INTRAMUSCULAR | Status: DC | PRN
  Administered 2012-12-26: 50 ug via INTRAVENOUS
  Filled 2012-12-26: qty 1

## 2012-12-26 MED ORDER — URELLE 81 MG PO TABS
1.0000 | ORAL_TABLET | Freq: Four times a day (QID) | ORAL | Status: DC
  Administered 2012-12-26: 81 mg via ORAL
  Filled 2012-12-26: qty 1

## 2012-12-26 MED ORDER — CEFAZOLIN SODIUM 1-5 GM-% IV SOLN
1.0000 g | INTRAVENOUS | Status: DC
  Filled 2012-12-26: qty 50

## 2012-12-26 MED ORDER — DEXAMETHASONE SODIUM PHOSPHATE 4 MG/ML IJ SOLN
INTRAMUSCULAR | Status: DC | PRN
  Administered 2012-12-26: 10 mg via INTRAVENOUS

## 2012-12-26 MED ORDER — PROPOFOL 10 MG/ML IV BOLUS
INTRAVENOUS | Status: DC | PRN
  Administered 2012-12-26: 200 mg via INTRAVENOUS
  Administered 2012-12-26 (×2): 50 mg via INTRAVENOUS

## 2012-12-26 SURGICAL SUPPLY — 44 items
ADAPTER CATH URET PLST 4-6FR (CATHETERS) IMPLANT
ADPR CATH URET STRL DISP 4-6FR (CATHETERS)
APL SKNCLS STERI-STRIP NONHPOA (GAUZE/BANDAGES/DRESSINGS)
BAG DRAIN URO-CYSTO SKYTR STRL (DRAIN) ×3 IMPLANT
BAG DRN UROCATH (DRAIN) ×2
BASKET LASER NITINOL 1.9FR (BASKET) IMPLANT
BASKET STNLS GEMINI 4WIRE 3FR (BASKET) IMPLANT
BASKET ZERO TIP NITINOL 2.4FR (BASKET) ×3 IMPLANT
BENZOIN TINCTURE PRP APPL 2/3 (GAUZE/BANDAGES/DRESSINGS) IMPLANT
BSKT STON RTRVL 120 1.9FR (BASKET)
BSKT STON RTRVL GEM 120X11 3FR (BASKET)
BSKT STON RTRVL ZERO TP 2.4FR (BASKET) ×2
CANISTER SUCT LVC 12 LTR MEDI- (MISCELLANEOUS) ×1 IMPLANT
CATH INTERMIT  6FR 70CM (CATHETERS) ×1 IMPLANT
CATH URET 5FR 28IN CONE TIP (BALLOONS)
CATH URET 5FR 28IN OPEN ENDED (CATHETERS) IMPLANT
CATH URET 5FR 70CM CONE TIP (BALLOONS) IMPLANT
CLOTH BEACON ORANGE TIMEOUT ST (SAFETY) ×3 IMPLANT
DRAPE CAMERA CLOSED 9X96 (DRAPES) ×3 IMPLANT
DRSG TEGADERM 2-3/8X2-3/4 SM (GAUZE/BANDAGES/DRESSINGS) IMPLANT
FIBER LASER FLEXIVA 1000 (UROLOGICAL SUPPLIES) IMPLANT
FIBER LASER FLEXIVA 200 (UROLOGICAL SUPPLIES) IMPLANT
FIBER LASER FLEXIVA 365 (UROLOGICAL SUPPLIES) ×1 IMPLANT
FIBER LASER FLEXIVA 550 (UROLOGICAL SUPPLIES) IMPLANT
GLOVE BIO SURGEON STRL SZ7.5 (GLOVE) ×3 IMPLANT
GLOVE BIOGEL M STER SZ 6 (GLOVE) ×1 IMPLANT
GLOVE BIOGEL PI IND STRL 6.5 (GLOVE) IMPLANT
GLOVE BIOGEL PI INDICATOR 6.5 (GLOVE) ×1
GOWN STRL NON-REIN LRG LVL3 (GOWN DISPOSABLE) ×1 IMPLANT
GOWN STRL REIN XL XLG (GOWN DISPOSABLE) ×3 IMPLANT
GUIDEWIRE 0.038 PTFE COATED (WIRE) IMPLANT
GUIDEWIRE ANG ZIPWIRE 038X150 (WIRE) IMPLANT
GUIDEWIRE STR DUAL SENSOR (WIRE) ×1 IMPLANT
IV NS IRRIG 3000ML ARTHROMATIC (IV SOLUTION) ×6 IMPLANT
KIT BALLIN UROMAX 15FX10 (LABEL) IMPLANT
KIT BALLN UROMAX 15FX4 (MISCELLANEOUS) IMPLANT
KIT BALLN UROMAX 26 75X4 (MISCELLANEOUS)
NS IRRIG 500ML POUR BTL (IV SOLUTION) ×1 IMPLANT
PACK CYSTOSCOPY (CUSTOM PROCEDURE TRAY) ×3 IMPLANT
SET HIGH PRES BAL DIL (LABEL)
SHEATH ACCESS URETERAL 38CM (SHEATH) IMPLANT
SHEATH ACCESS URETERAL 54CM (SHEATH) IMPLANT
SHEATH URET ACCESS 12FR/35CM (UROLOGICAL SUPPLIES) ×1 IMPLANT
STENT URET 6FRX24 CONTOUR (STENTS) ×2 IMPLANT

## 2012-12-26 NOTE — Interval H&P Note (Signed)
History and Physical Interval Note:  12/26/2012 9:17 AM  Mary Herman  has presented today for surgery, with the diagnosis of RIGHT URETERAL CALCULUS, LEFT RENAL CALCULUS  The various methods of treatment have been discussed with the patient and family. After consideration of risks, benefits and other options for treatment, the patient has consented to  Procedure(s): CYSTOSCOPY WITH RETROGRADE PYELOGRAM, URETEROSCOPY AND STENT PLACEMENT  Procedure: Cystoscopy, Bilateral Retrograde Pyelogram, Right Ureteroscopy with Holmium Laser Lithotripsy and Possible JJ Stent (Bilateral) HOLMIUM LASER APPLICATION (Right) as a surgical intervention .  The patient's history has been reviewed, patient examined, no change in status, stable for surgery.  I have reviewed the patient's chart and labs.  Questions were answered to the patient's satisfaction.    She had had increasing left flank pain along with right sided discomfort. Still on/ off gross hematuria. Will change consent to possible bilateral urteroscopy/ stent. Rico Massar S

## 2012-12-26 NOTE — Op Note (Signed)
Preoperative diagnosis: Right ureteral calculus, left renal calculus, hematuria Postoperative diagnosis: Same  Procedure: Cystoscopy, bilateral retrograde pyelography, right ureteroscopy with holmium laser lithotripsy, basket of fragments and bilateral placement of JJ stents2   Surgeon: Valetta Fuller M.D.  Anesthesia: Gen.  Indications: [name] is recently assess for some nonspecific bilateral back and flank discomfort along with some intermittent gross hematuria. CT revealed a 6 mm right mid ureteral calculus and a 6-8 mm stone in the midpole of her left kidney. She does have a history of gastric bypass. Stones were difficult to visualize on plain film imaging suggesting a possibility of uric acid nephrolithiasis. Because of ongoing discomfort we have jointly agreed to perform ureteroscopy to further/definitively treat the right ureteral calculus. The patient has told me that she is having increased discomfort on the left side and therefore we will perform retrograde pyelogram and consider ureteroscopy with definitive treatment if a stone is indeed found in that ureter. The patient is received perioperative antibiotics. Full informed consent has been obtained. Appropriate surgical timeout was performed.     Technique and findings: Patient was brought the operating room she had sessile induction general anesthesia. She was placed in lithotomy position prepped draped usual manner. Cystoscopy revealed unremarkable bladder and urethra without evidence of other pathology. Both ureteral orifices were in their normal anatomic positions but extremely stenotic bilaterally. The right orifice fairly accepted the glide wire and it was difficult to initially insert her open-ended catheter. A significant filling defect was noted in the right mid ureter. A guidewire was placed in the renal pelvis.  The distal ureter was dilated with the inside portion of an access sheath to create one step dilation. The ureter was  engaged with a 6 French rigid ureteroscope and a 70 mm stone was encountered in the mid ureter. Holmium laser lithotripter was used at 0.8 J and 8 Hz to break the stone into approximately a dozen pieces. The largest fragments were basket extracted utilizing a 0 tip Nitinol basket. A 6 French 24 cm double-J stent was then placed over the wire using fluoroscopic as well as visual guidance.   On the left side the patient again had a very stenotic ureteral orifice. Retrograde pyelography was performed. The ureter showed no evidence of obstruction or filling defects. Within the renal pelvis on the left side the patient had a 70 mm filling defect consistent with a stone had now migrated from the midpole calyx into the renal pelvis and may be causing some intermittent obstruction at the ureteropelvic junction. I felt that given the difficulty with her distal ureter and as how tight it was it would be better not to try to place a larger access sheath today and rather stent that side in hopes of either dissolving the stone if it is indeed determined to be uric acid essentially performing ESWL. A 6 French 24 cm double-J stent was placed on that side as well. The patient was brought to recovery in stable condition having had no obvious complication or problems

## 2012-12-26 NOTE — Transfer of Care (Signed)
Immediate Anesthesia Transfer of Care Note  Patient: PATICIA MOSTER  Procedure(s) Performed: Procedure(s) (LRB): cystoscopy with bilateral retrogrades, bilateral ureteroscopy  (Bilateral) HOLMIUM LASER APPLICATION (Right) CYSTOSCOPY WITH STENT PLACEMENT (Bilateral)  Patient Location: PACU  Anesthesia Type: General  Level of Consciousness: awake, alert  and oriented  Airway & Oxygen Therapy: Patient Spontanous Breathing and Patient connected to face mask oxygen  Post-op Assessment: Report given to PACU RN and Post -op Vital signs reviewed and stable  Post vital signs: Reviewed and stable  Complications: No apparent anesthesia complications

## 2012-12-26 NOTE — Anesthesia Preprocedure Evaluation (Addendum)
Anesthesia Evaluation  Patient identified by MRN, date of birth, ID band Patient awake    Reviewed: Allergy & Precautions, H&P , NPO status , Patient's Chart, lab work & pertinent test results  Airway Mallampati: II TM Distance: >3 FB Neck ROM: Full    Dental no notable dental hx.    Pulmonary  No sleep apnea since gastric bypass surgery. breath sounds clear to auscultation  Pulmonary exam normal       Cardiovascular Exercise Tolerance: Good hypertension, Rhythm:Regular Rate:Normal     Neuro/Psych Anxiety negative neurological ROS     GI/Hepatic negative GI ROS, Neg liver ROS,   Endo/Other  Hypothyroidism Morbid obesity  Renal/GU Renal diseaseBilateral kidney stones.  negative genitourinary   Musculoskeletal negative musculoskeletal ROS (+)   Abdominal (+) + obese,   Peds negative pediatric ROS (+)  Hematology negative hematology ROS (+)   Anesthesia Other Findings   Reproductive/Obstetrics negative OB ROS                          Anesthesia Physical Anesthesia Plan  ASA: III  Anesthesia Plan: General   Post-op Pain Management:    Induction: Intravenous  Airway Management Planned: LMA  Additional Equipment:   Intra-op Plan:   Post-operative Plan: Extubation in OR  Informed Consent: I have reviewed the patients History and Physical, chart, labs and discussed the procedure including the risks, benefits and alternatives for the proposed anesthesia with the patient or authorized representative who has indicated his/her understanding and acceptance.   Dental advisory given  Plan Discussed with: CRNA  Anesthesia Plan Comments:         Anesthesia Quick Evaluation

## 2012-12-26 NOTE — Anesthesia Procedure Notes (Signed)
Procedure Name: LMA Insertion Date/Time: 12/26/2012 9:27 AM Performed by: Norva Pavlov Pre-anesthesia Checklist: Patient identified, Emergency Drugs available, Suction available and Patient being monitored Patient Re-evaluated:Patient Re-evaluated prior to inductionOxygen Delivery Method: Circle System Utilized Preoxygenation: Pre-oxygenation with 100% oxygen Intubation Type: IV induction Ventilation: Mask ventilation without difficulty LMA: LMA inserted LMA Size: 4.0 Number of attempts: 1 Airway Equipment and Method: bite block Placement Confirmation: positive ETCO2 Tube secured with: Tape Dental Injury: Teeth and Oropharynx as per pre-operative assessment

## 2012-12-27 ENCOUNTER — Encounter (HOSPITAL_BASED_OUTPATIENT_CLINIC_OR_DEPARTMENT_OTHER): Payer: Self-pay | Admitting: Urology

## 2012-12-27 LAB — POCT HEMOGLOBIN-HEMACUE: Hemoglobin: 13.1 g/dL (ref 12.0–15.0)

## 2012-12-27 NOTE — Anesthesia Postprocedure Evaluation (Signed)
  Anesthesia Post-op Note  Patient: Mary Herman  Procedure(s) Performed: Procedure(s) (LRB): cystoscopy with bilateral retrogrades, bilateral ureteroscopy  (Bilateral) HOLMIUM LASER APPLICATION (Right) CYSTOSCOPY WITH STENT PLACEMENT (Bilateral)  Patient Location: PACU  Anesthesia Type: General  Level of Consciousness: awake and alert   Airway and Oxygen Therapy: Patient Spontanous Breathing  Post-op Pain: mild  Post-op Assessment: Post-op Vital signs reviewed, Patient's Cardiovascular Status Stable, Respiratory Function Stable, Patent Airway and No signs of Nausea or vomiting  Last Vitals:  Filed Vitals:   12/26/12 1158  BP: 135/90  Pulse: 76  Temp: 36.1 C  Resp:     Post-op Vital Signs: stable   Complications: No apparent anesthesia complications

## 2012-12-28 ENCOUNTER — Encounter (HOSPITAL_COMMUNITY): Payer: Self-pay | Admitting: *Deleted

## 2012-12-28 ENCOUNTER — Other Ambulatory Visit: Payer: Self-pay | Admitting: Urology

## 2012-12-30 ENCOUNTER — Encounter (HOSPITAL_COMMUNITY): Payer: Self-pay | Admitting: Pharmacy Technician

## 2013-01-02 ENCOUNTER — Ambulatory Visit (HOSPITAL_COMMUNITY)
Admission: RE | Admit: 2013-01-02 | Discharge: 2013-01-02 | Disposition: A | Discharge status: Home / Self Care | Payer: 59 | Source: Ambulatory Visit | Attending: Urology | Admitting: Urology

## 2013-01-02 ENCOUNTER — Ambulatory Visit (HOSPITAL_COMMUNITY): Payer: 59

## 2013-01-02 ENCOUNTER — Encounter (HOSPITAL_COMMUNITY): Payer: Self-pay | Admitting: General Practice

## 2013-01-02 ENCOUNTER — Encounter (HOSPITAL_COMMUNITY)
Admission: RE | Disposition: A | Discharge status: Home / Self Care | Payer: Self-pay | Source: Ambulatory Visit | Attending: Urology

## 2013-01-02 DIAGNOSIS — Z9884 Bariatric surgery status: Secondary | ICD-10-CM | POA: Insufficient documentation

## 2013-01-02 DIAGNOSIS — N201 Calculus of ureter: Secondary | ICD-10-CM | POA: Insufficient documentation

## 2013-01-02 DIAGNOSIS — N200 Calculus of kidney: Secondary | ICD-10-CM | POA: Insufficient documentation

## 2013-01-02 LAB — PREGNANCY, URINE: Preg Test, Ur: NEGATIVE

## 2013-01-02 SURGERY — LITHOTRIPSY, ESWL
Anesthesia: LOCAL | Laterality: Left

## 2013-01-02 MED ORDER — DIAZEPAM 5 MG PO TABS
10.0000 mg | ORAL_TABLET | ORAL | Status: AC
  Administered 2013-01-02: 10 mg via ORAL
  Filled 2013-01-02: qty 2

## 2013-01-02 MED ORDER — DEXTROSE-NACL 5-0.45 % IV SOLN
INTRAVENOUS | Status: DC
  Administered 2013-01-02: 09:00:00 via INTRAVENOUS

## 2013-01-02 MED ORDER — CIPROFLOXACIN IN D5W 400 MG/200ML IV SOLN
400.0000 mg | INTRAVENOUS | Status: AC
  Administered 2013-01-02: 400 mg via INTRAVENOUS
  Filled 2013-01-02: qty 200

## 2013-01-02 MED ORDER — DIPHENHYDRAMINE HCL 25 MG PO CAPS
25.0000 mg | ORAL_CAPSULE | ORAL | Status: AC
  Administered 2013-01-02: 25 mg via ORAL
  Filled 2013-01-02: qty 1

## 2013-01-02 MED ORDER — OXYCODONE-ACETAMINOPHEN 5-325 MG PO TABS
1.0000 | ORAL_TABLET | ORAL | Status: DC | PRN
  Administered 2013-01-02: 2 via ORAL
  Filled 2013-01-02: qty 2

## 2013-01-02 NOTE — H&P (View-Only) (Signed)
History of Present Illness   Mary Herman presents today as a referral from Dr. Lenord Fellers for further assessment of a recently diagnosed nephrolithiasis/ureteral calculus. Mary Herman is currently 52 years of age. Her only prior urologic history was an assessment by Dr. Marcelyn Bruins years ago for possible interstitial cystitis, but it does not appear she has had any ongoing difficulties in that arena. She has no prior history of nephrolithiasis but does have a family history. For months she has been experiencing some intermittent nonspecific bilateral low-back discomfort and also some intermittent hematuria. She postponed the evaluation due to some insurance changes. Recently she has been experiencing bilateral low-back pain but, interestingly, it has been primarily on the left side. She did recently undergo an appropriate CT of her abdomen and pelvis with and without contrast. This did demonstrate a 6 mm nonobstructing stone in the lower to mid pole of the left kidney. On the right side, there was a 5 mm stone in the right mid ureter, producing some mild hydronephrosis. Those images were taken just a few days ago. Her urine pH has been in the 5.5 to 6.0 range. Other significant history includes a previous gastric bypass.      Past Medical History Problems  1. History of  Anxiety (Symptom) 300.00 2. History of  Arthritis V13.4 3. History of  Depression 311 4. History of  Hyperthyroidism 242.90  Surgical History Problems  1. History of  Cesarean Section 2. History of  Gastric Surgery For Morbid Obesity Gastric Bypass 3. History of  Jaw Surgery 4. History of  Knee Arthroscopy 5. History of  Shoulder Arthroscopy  Current Meds 1. Singulair TABS; Therapy: (Recorded:30Sep2014) to 2. Xyzal TABS; Therapy: (Recorded:30Sep2014) to  Allergies Medication  1. Codeine Derivatives  Family History Problems  1. Fraternal history of  Depression 2. Family history of  Family Health Status Number Of Children 1  daughter 3. Family history of  Hematuria 4. Family history of  Mother Deceased At Age 45 5. Family history of  Nephrolithiasis 6. Fraternal history of  Nephrolithiasis 7. 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  Genitourinary: hematuria.  Constitutional: feeling tired (fatigue).  ENT: sinus problems.  Hematologic/Lymphatic: a tendency to easily bruise.  Musculoskeletal: back pain and joint pain.  Psychiatric: depression and anxiety.    Vitals BMI Calculated: 48.12 BSA Calculated: 2.22 Height: 5 ft 3.5 in Weight: 275 lb  Blood Pressure: 130 / 87 Temperature: 98 F Heart Rate: 106  Physical Exam Constitutional: Well nourished and well developed . No acute distress.  ENT:. The ears and nose are normal in appearance.  Neck: The appearance of the neck is normal and no neck mass is present.  Pulmonary: No respiratory distress and normal respiratory rhythm and effort.  Cardiovascular: Heart rate and rhythm are normal . No peripheral edema.  Abdomen: The abdomen is soft and nontender. No masses are palpated. No CVA tenderness. No hernias are palpable. No hepatosplenomegaly noted.  Skin: Normal skin turgor, no visible rash and no visible skin lesions.  Neuro/Psych:. Mood and affect are appropriate.    Results/Data Urine [Data Includes: Last 1 Day]  30Sep2014 COLOR YELLOW  APPEARANCE CLEAR  SPECIFIC GRAVITY 1.025  pH 6.0  GLUCOSE NEG mg/dL BILIRUBIN NEG  KETONE NEG mg/dL BLOOD LARGE  PROTEIN TRACE mg/dL UROBILINOGEN 0.2  mg/dL NITRITE NEG  LEUKOCYTE ESTERASE TRACE  SQUAMOUS EPITHELIAL/HPF FEW  WBC 3-6 WBC/hpf RBC 21-50 RBC/hpf BACTERIA MODERATE  CRYSTALS Calcium Oxalate crystals noted  CASTS NONE SEEN   Procedure  On KUB today I cannot definitively visualize either the left renal calculus or the right ureteral calculus.   Assessment Assessed  1. Ureteral Stone 592.1 2. Nephrolithiasis 592.0  Plan Health Maintenance (V70.0)  1. UA With REFLEX  Done: 30Sep2014 09:59AM Nephrolithiasis (592.0)  2. Tamsulosin HCl 0.4 MG Oral Capsule; TAKE 1 CAPSULE EVERY DAY; Therapy:  30Sep2014 to (Last Rx:30Sep2014)  Requested for: 30Sep2014; Edited 3. KUB  Done: 30Sep2014 12:00AM 4. Follow-up Schedule Surgery Office  Follow-up  Requested for: 30Sep2014  Discussion/Summary   Mary Herman has had several months of intermittent hematuria and some bilateral flank discomfort. She clearly has a 5-6 mm stone in the right mid ureter. What is unclear is how long that has actually been in her ureter. She does not have high-grade obstruction, but certainly if the stone has been in there for that length of time, it is time to consider definitive intervention. Statistically, she has a 40-50% chance of passing the stone but, again, if it has been in there more than 4-6 weeks, those odds are substantially lower. There is some question as to whether this might be uric acid nephrolithiasis. I cannot see the stone well at all on KUB imaging. Urine pH is in the 5 to 5-6 range, which his a little high typically for uric acid nephrolithiasis. I suspect these are poorly visualized calcium stones, but it is difficult to say with certainty. I do not have a good explanation why she is having pain on the left side. That stone appears to be nonobstructing and her left-sided back pain may be musculoskeletal and unrelated. At this point, I have proposed that we consider cystoscopy with bilateral retrograde pyelography, right-sided ureteroscopy with Holmium  laser lithotripsy and possible stent placement. If for some reason the left stone has migrated in the ureter, then we can consider definitive management of that stone as well. Otherwise, we would need the left-sided stone for a later time. If this does turn out to be uric acid nephrolithiasis, we could conceivably dissolve the stone on the left. She is not a candidate for ESWL, given the lack of visibility on KUB imaging today.

## 2013-01-02 NOTE — Interval H&P Note (Signed)
History and Physical Interval Note:  The patient has undergone successful ureteroscopy with laser lithotripsy of her right distal ureteral calculus. A double-J stent was placed. On the left side she was noted to have a stone right at her ureteral pelvic junction. A stent was placed without attempt to manipulate the stone at that time. She presents now for ESWL to attempt to treat the left-sided renal calculus.  01/02/2013 9:48 AM  Mary Herman  has presented today for surgery, with the diagnosis of LEFT RENAL CALCULUS  The various methods of treatment have been discussed with the patient and family. After consideration of risks, benefits and other options for treatment, the patient has consented to  Procedure(s): LEFT EXTRACORPOREAL SHOCK WAVE LITHOTRIPSY (ESWL) (Left) as a surgical intervention .  The patient's history has been reviewed, patient examined, no change in status, stable for surgery.  I have reviewed the patient's chart and labs.  Questions were answered to the patient's satisfaction.     [name] S

## 2013-01-02 NOTE — Op Note (Signed)
See [facility] OP note scanned into chart. 

## 2013-01-04 ENCOUNTER — Other Ambulatory Visit: Payer: Self-pay | Admitting: *Deleted

## 2013-01-04 MED ORDER — CELECOXIB 200 MG PO CAPS: 200.0000 mg | ORAL_CAPSULE | Freq: Two times a day (BID) | ORAL | Status: DC

## 2013-01-04 MED ORDER — CYCLOBENZAPRINE HCL 10 MG PO TABS: 10.0000 mg | ORAL_TABLET | Freq: Every day | ORAL | Status: DC

## 2013-01-04 MED ORDER — LEVOTHYROXINE SODIUM 88 MCG PO TABS: 88.0000 ug | ORAL_TABLET | Freq: Every evening | ORAL | Status: DC

## 2013-01-04 MED ORDER — BUPROPION HCL ER (SR) 150 MG PO TB12: 150.0000 mg | ORAL_TABLET | Freq: Two times a day (BID) | ORAL | Status: DC

## 2013-01-04 MED ORDER — LEVOCETIRIZINE DIHYDROCHLORIDE 5 MG PO TABS: 5.0000 mg | ORAL_TABLET | Freq: Every evening | ORAL | Status: DC

## 2013-01-04 MED ORDER — MONTELUKAST SODIUM 10 MG PO TABS: 10.0000 mg | ORAL_TABLET | Freq: Every day | ORAL | Status: DC

## 2013-02-07 ENCOUNTER — Other Ambulatory Visit: Payer: BC Managed Care – PPO | Admitting: Internal Medicine

## 2013-02-07 DIAGNOSIS — Z130 Encounter for screening for diseases of the blood and blood-forming organs and certain disorders involving the immune mechanism: Secondary | ICD-10-CM

## 2013-02-07 DIAGNOSIS — E039 Hypothyroidism, unspecified: Secondary | ICD-10-CM

## 2013-02-07 DIAGNOSIS — E785 Hyperlipidemia, unspecified: Secondary | ICD-10-CM

## 2013-02-07 DIAGNOSIS — I10 Essential (primary) hypertension: Secondary | ICD-10-CM

## 2013-02-07 LAB — CBC WITH DIFFERENTIAL/PLATELET
Eosinophils Absolute: 0.1 10*3/uL (ref 0.0–0.7)
Eosinophils Relative: 2 % (ref 0–5)
HCT: 43.9 % (ref 36.0–46.0)
Hemoglobin: 14.4 g/dL (ref 12.0–15.0)
Lymphocytes Relative: 32 % (ref 12–46)
Lymphs Abs: 1.9 10*3/uL (ref 0.7–4.0)
MCV: 86.8 fL (ref 78.0–100.0)
Monocytes Absolute: 0.4 10*3/uL (ref 0.1–1.0)
Monocytes Relative: 6 % (ref 3–12)
RBC: 5.06 MIL/uL (ref 3.87–5.11)
WBC: 6 10*3/uL (ref 4.0–10.5)

## 2013-02-07 LAB — COMPREHENSIVE METABOLIC PANEL
ALT: 13 U/L (ref 0–35)
AST: 18 U/L (ref 0–37)
Albumin: 4.1 g/dL (ref 3.5–5.2)
Calcium: 9.3 mg/dL (ref 8.4–10.5)
Chloride: 105 mEq/L (ref 96–112)
Potassium: 4.4 mEq/L (ref 3.5–5.3)
Sodium: 140 mEq/L (ref 135–145)
Total Protein: 7 g/dL (ref 6.0–8.3)

## 2013-02-10 ENCOUNTER — Ambulatory Visit (INDEPENDENT_AMBULATORY_CARE_PROVIDER_SITE_OTHER): Payer: BC Managed Care – PPO | Admitting: Internal Medicine

## 2013-02-10 ENCOUNTER — Encounter: Payer: Self-pay | Admitting: Internal Medicine

## 2013-02-10 ENCOUNTER — Other Ambulatory Visit (HOSPITAL_COMMUNITY)
Admission: RE | Admit: 2013-02-10 | Discharge: 2013-02-10 | Disposition: A | Discharge status: Home / Self Care | Payer: BC Managed Care – PPO | Source: Ambulatory Visit | Attending: Internal Medicine | Admitting: Internal Medicine

## 2013-02-10 VITALS — BP 132/80 | HR 66 | Temp 98.7°F | Ht 63.0 in | Wt 270.0 lb

## 2013-02-10 DIAGNOSIS — Z01419 Encounter for gynecological examination (general) (routine) without abnormal findings: Secondary | ICD-10-CM | POA: Insufficient documentation

## 2013-02-10 DIAGNOSIS — F329 Major depressive disorder, single episode, unspecified: Secondary | ICD-10-CM

## 2013-02-10 DIAGNOSIS — Z8739 Personal history of other diseases of the musculoskeletal system and connective tissue: Secondary | ICD-10-CM

## 2013-02-10 DIAGNOSIS — J309 Allergic rhinitis, unspecified: Secondary | ICD-10-CM

## 2013-02-10 DIAGNOSIS — E559 Vitamin D deficiency, unspecified: Secondary | ICD-10-CM

## 2013-02-10 DIAGNOSIS — E039 Hypothyroidism, unspecified: Secondary | ICD-10-CM

## 2013-02-10 DIAGNOSIS — Z0100 Encounter for examination of eyes and vision without abnormal findings: Secondary | ICD-10-CM

## 2013-02-10 DIAGNOSIS — Z9884 Bariatric surgery status: Secondary | ICD-10-CM

## 2013-02-10 DIAGNOSIS — Z0000 Encounter for general adult medical examination without abnormal findings: Secondary | ICD-10-CM

## 2013-02-10 NOTE — Progress Notes (Signed)
   Subjective:    Patient ID: Mary Herman, female    DOB: 12/20/1960, 52 y.o.   MRN: [id number]  HPI 52 year old White female in today for health maintenance exam and evaluation of medical issues.  Past medical history: History of allergic rhinitis. She is allergic to cats and has cats in her home. History of depression. History of gastric bypass surgery done in 2009 by Dr. Daphine Deutscher. History of interstitial cystitis in the remote past. History of back pain. History of TMJ syndrome. Had arthroscopic surgery on left knee some 25 years ago. Had cholecystectomy. Cesarean section 1998. History of hypothyroidism.  Codeine causes a rash.  Tetanus immunization done through [facility]. Received influenza vaccine through [facility].  Social history: Patient is married. Husband is disabled. Patient has a high school education. She does not smoke or consume alcohol. She has one daughter who is a teenager. Patient says daughter has had some 9 operations on her femur. Patient is a Futures trader.  Family history: Father was deceased at age 75 with history of alcoholism but cause of death not known to patient. Mother with history of brain aneurysm 10 years prior to her death. Mother died at age 31 with history of stroke and had lung disease. No sisters. One brother age 39 with history of hypertension. One brother age 12 his health she does not know about.    Review of Systems  Constitutional: Positive for fatigue.  Gastrointestinal:       History of gastric bypass surgery  Endocrine:       History of hypothyroidism  Psychiatric/Behavioral:       History of depression       Objective:   Physical Exam  Vitals reviewed. Constitutional: She is oriented to person, place, and time. She appears well-developed and well-nourished. No distress.  HENT:  Head: Normocephalic and atraumatic.  Right Ear: External ear normal.  Left Ear: External ear normal.  Mouth/Throat: Oropharynx is clear and  moist. No oropharyngeal exudate.  Eyes: Conjunctivae and EOM are normal. Pupils are equal, round, and reactive to light. Right eye exhibits no discharge. Left eye exhibits no discharge.  Neck: Neck supple. No JVD present. No thyromegaly present.  Cardiovascular: Normal rate, regular rhythm and normal heart sounds.   No murmur heard. Pulmonary/Chest: Effort normal and breath sounds normal. No respiratory distress. She has no wheezes. She has no rales. She exhibits no tenderness.  Breasts normal female  Abdominal: Bowel sounds are normal. She exhibits no distension and no mass. There is no tenderness. There is no rebound and no guarding.  Genitourinary:  Pap taken bimanual normal  Musculoskeletal: She exhibits no edema.  Lymphadenopathy:    She has no cervical adenopathy.  Neurological: She is alert and oriented to person, place, and time. She has normal reflexes. She displays normal reflexes. No cranial nerve deficit. Coordination normal.  Skin: Skin is warm and dry. No rash noted. She is not diaphoretic.  Psychiatric: She has a normal mood and affect. Her behavior is normal. Judgment and thought content normal.          Assessment & Plan:  Hypothyroidism-TSH is low. Patient feels well on present dose. Repeat in 3 months. For now continue same dose of levothyroxine.  History of low back pain  History of depression  History of allergic rhinitis  History of gastric bypass surgery  Vitamin D deficiency-recommend 2000 units vitamin D 3 over-the-counter

## 2013-02-10 NOTE — Patient Instructions (Addendum)
Recheck Vitamin D level and TSH in 3 months. Take 2000 units vitamin D 3 daily.

## 2013-02-21 ENCOUNTER — Ambulatory Visit (INDEPENDENT_AMBULATORY_CARE_PROVIDER_SITE_OTHER): Payer: BC Managed Care – PPO | Admitting: Internal Medicine

## 2013-02-21 ENCOUNTER — Encounter: Payer: Self-pay | Admitting: Internal Medicine

## 2013-02-21 VITALS — BP 122/86 | Temp 98.8°F | Wt 255.0 lb

## 2013-02-21 DIAGNOSIS — J069 Acute upper respiratory infection, unspecified: Secondary | ICD-10-CM

## 2013-02-21 DIAGNOSIS — J029 Acute pharyngitis, unspecified: Secondary | ICD-10-CM

## 2013-02-21 LAB — POCT RAPID STREP A (OFFICE): Rapid Strep A Screen: NEGATIVE

## 2013-02-21 NOTE — Patient Instructions (Signed)
Take Hycodan as directed. Levaquin 500 mg daily x 7 days.

## 2013-02-21 NOTE — Addendum Note (Signed)
Addended by: Judy Pimple on: 02/21/2013 05:10 PM   Modules accepted: Orders

## 2013-02-21 NOTE — Progress Notes (Signed)
   Subjective:    Patient ID: Mary Herman, female    DOB: 1960/10/03, 52 y.o.   MRN: [id number]  HPI Patient says her husband and her daughter have both had flu and had positive flu tests. Daughter took nasal flu vaccine. They should his respiratory congestion, ear pain and sore throat. No fever or shaking chills. No myalgias. Slight cough. She's worried about bronchitis.    Review of Systems     Objective:   Physical Exam TMs are full bilaterally. They are not red. Pharynx is slightly injected. Rapid strep screen is negative. Neck is supple without significant adenopathy. Chest is clear without rales or wheezing        Assessment & Plan:  Acute URI  Plan: Levaquin 500 milligrams daily for 7 days. Take with a meal. Hycodan 8 ounces 1 teaspoon by mouth every 8 hours when necessary cough.

## 2013-04-17 ENCOUNTER — Other Ambulatory Visit: Payer: Self-pay | Admitting: Internal Medicine

## 2013-04-18 ENCOUNTER — Encounter: Payer: Self-pay | Admitting: Internal Medicine

## 2013-04-18 ENCOUNTER — Ambulatory Visit (INDEPENDENT_AMBULATORY_CARE_PROVIDER_SITE_OTHER): Payer: BC Managed Care – PPO | Admitting: Internal Medicine

## 2013-04-18 VITALS — BP 132/90 | HR 88 | Temp 98.8°F | Wt 262.0 lb

## 2013-04-18 DIAGNOSIS — J029 Acute pharyngitis, unspecified: Secondary | ICD-10-CM

## 2013-04-18 DIAGNOSIS — R059 Cough, unspecified: Secondary | ICD-10-CM

## 2013-04-18 DIAGNOSIS — H6690 Otitis media, unspecified, unspecified ear: Secondary | ICD-10-CM

## 2013-04-18 DIAGNOSIS — R05 Cough: Secondary | ICD-10-CM

## 2013-04-18 DIAGNOSIS — H6693 Otitis media, unspecified, bilateral: Secondary | ICD-10-CM

## 2013-04-18 LAB — POCT RAPID STREP A (OFFICE): Rapid Strep A Screen: NEGATIVE

## 2013-04-18 MED ORDER — HYDROCOD POLST-CHLORPHEN POLST 10-8 MG/5ML PO LQCR: 5.0000 mL | Freq: Two times a day (BID) | ORAL | Status: DC | PRN

## 2013-04-18 MED ORDER — LEVOFLOXACIN 500 MG PO TABS: 500.0000 mg | ORAL_TABLET | Freq: Every day | ORAL | Status: DC

## 2013-04-18 NOTE — Progress Notes (Signed)
   Subjective:    Patient ID: Mary Herman, female    DOB: Dec 28, 1960, 53 y.o.   MRN: [id number]  HPI Onset cough and congestion February 22. Cough is nonproductive. Cough is deep and congested. Has also had sore throat. No fever or shaking chills. Had similar illness December 30. 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    Review of Systems     Objective:   Physical Exam both TMs are full and slightly dull but not red. Pharynx is red without exudate. Rapid strep screen negative. Neck is supple. Chest clear to auscultation without rales or wheezing        Assessment & Plan:  Bilateral otitis media  Cough  Pharyngitis  Plan: Tussionex  4 ounces 1 teaspoon by mouth every 12 hours when necessary cough. Levaquin 500 milligrams daily for 7 days.

## 2013-04-18 NOTE — Addendum Note (Signed)
Addended by: Brett Canales on: 04/18/2013 05:25 PM   Modules accepted: Orders

## 2013-04-18 NOTE — Patient Instructions (Signed)
Take Levaquin 500 milligrams daily for 7 days. Take Tussionex every 12 hours as needed for cough.

## 2013-04-22 DIAGNOSIS — J209 Acute bronchitis, unspecified: Secondary | ICD-10-CM

## 2013-04-23 ENCOUNTER — Telehealth: Payer: Self-pay | Admitting: Internal Medicine

## 2013-04-23 NOTE — Telephone Encounter (Signed)
Patient seen earlier this week treated with Levaquin for cough and prescribed cough syrup. Says she is worse. Has wheezing and has more congestion in her chest. Call and Medrol 4 mg 6 day dosepak to [facility] [phone]

## 2013-05-04 ENCOUNTER — Encounter: Payer: Self-pay | Admitting: Internal Medicine

## 2013-05-04 ENCOUNTER — Ambulatory Visit (INDEPENDENT_AMBULATORY_CARE_PROVIDER_SITE_OTHER): Payer: BC Managed Care – PPO | Admitting: Internal Medicine

## 2013-05-04 VITALS — BP 126/84 | HR 80 | Temp 98.4°F | Wt 262.0 lb

## 2013-05-04 DIAGNOSIS — J209 Acute bronchitis, unspecified: Secondary | ICD-10-CM

## 2013-05-04 DIAGNOSIS — J019 Acute sinusitis, unspecified: Secondary | ICD-10-CM

## 2013-05-04 MED ORDER — METHYLPREDNISOLONE ACETATE 80 MG/ML IJ SUSP
80.0000 mg | Freq: Once | INTRAMUSCULAR | Status: AC
  Administered 2013-05-04: 80 mg via INTRAMUSCULAR

## 2013-05-04 MED ORDER — CLARITHROMYCIN 500 MG PO TABS: 500.0000 mg | ORAL_TABLET | Freq: Two times a day (BID) | ORAL | Status: DC

## 2013-05-04 MED ORDER — HYDROCODONE-HOMATROPINE 5-1.5 MG/5ML PO SYRP: 5.0000 mL | ORAL_SOLUTION | Freq: Three times a day (TID) | ORAL | Status: DC | PRN

## 2013-05-04 NOTE — Progress Notes (Signed)
   Subjective:    Patient ID: Mary Herman, female    DOB: [birth year], 53 y.o.   MRN: [id number]  HPI  Still ill with respiratory infection despite course of Levaquin, Cough syrup, and course of steroids. Coughing still with discolored sputum. No documented fever or chills.    Review of Systems     Objective:   Physical Exam TMs are slightly full bilaterally. Pharynx clear. Neck is supple without adenopathy. Chest clear. Has deep congested cough.       Assessment & Plan:  Bronchitis Sinusitis  Plan: Depomedrol 80 mg IM, Biaxin 500 mg bid x 10 days. Refill Hycodan syrup.  Addendum: Pharmacist called and said patient may be allergic to Biaxin as he had that she was allergic to Zithromax. He will check with patient. We do not have that down as an allergy for her.

## 2013-05-04 NOTE — Patient Instructions (Signed)
Take Biaxin 500 mg twice daily for 10 days. Depo-Medrol 80 mg IM given today. Take Hycodan sparingly for cough.

## 2013-05-09 ENCOUNTER — Other Ambulatory Visit: Payer: Self-pay | Admitting: Internal Medicine

## 2013-05-09 ENCOUNTER — Other Ambulatory Visit: Payer: BC Managed Care – PPO | Admitting: Internal Medicine

## 2013-05-09 DIAGNOSIS — B351 Tinea unguium: Secondary | ICD-10-CM

## 2013-05-09 LAB — CBC WITH DIFFERENTIAL/PLATELET
BASOS ABS: 0 10*3/uL (ref 0.0–0.1)
Basophils Relative: 0 % (ref 0–1)
EOS ABS: 0.2 10*3/uL (ref 0.0–0.7)
EOS PCT: 3 % (ref 0–5)
HEMATOCRIT: 43 % (ref 36.0–46.0)
Hemoglobin: 14.3 g/dL (ref 12.0–15.0)
LYMPHS ABS: 2.2 10*3/uL (ref 0.7–4.0)
Lymphocytes Relative: 28 % (ref 12–46)
MCH: 28.1 pg (ref 26.0–34.0)
MCHC: 33.3 g/dL (ref 30.0–36.0)
MCV: 84.6 fL (ref 78.0–100.0)
Monocytes Absolute: 0.4 10*3/uL (ref 0.1–1.0)
Monocytes Relative: 5 % (ref 3–12)
Neutro Abs: 4.9 10*3/uL (ref 1.7–7.7)
Neutrophils Relative %: 64 % (ref 43–77)
Platelets: 266 10*3/uL (ref 150–400)
RBC: 5.08 MIL/uL (ref 3.87–5.11)
RDW: 13.7 % (ref 11.5–15.5)
WBC: 7.7 10*3/uL (ref 4.0–10.5)

## 2013-05-09 LAB — HEPATIC FUNCTION PANEL
ALT: 13 U/L (ref 0–35)
AST: 16 U/L (ref 0–37)
Albumin: 3.9 g/dL (ref 3.5–5.2)
Alkaline Phosphatase: 127 U/L — ABNORMAL HIGH (ref 39–117)
BILIRUBIN INDIRECT: 0.6 mg/dL (ref 0.2–1.2)
Bilirubin, Direct: 0.2 mg/dL (ref 0.0–0.3)
TOTAL PROTEIN: 7 g/dL (ref 6.0–8.3)
Total Bilirubin: 0.8 mg/dL (ref 0.2–1.2)

## 2013-05-10 LAB — VITAMIN D 25 HYDROXY (VIT D DEFICIENCY, FRACTURES): Vit D, 25-Hydroxy: 33 ng/mL (ref 30–89)

## 2013-05-18 ENCOUNTER — Telehealth: Payer: Self-pay

## 2013-05-18 NOTE — Telephone Encounter (Signed)
Continue Lamisil 90 more days 250 mg daily. Take OTC Vitamin D as level was within normal limits.

## 2013-05-18 NOTE — Telephone Encounter (Signed)
Wants to know if she should continue the Lamisil. Nail fungus better, but not cleared completely. Also, states that otc Vitamin D did not work for her in the past. Should she do rx strength again?

## 2013-05-19 ENCOUNTER — Other Ambulatory Visit: Payer: Self-pay

## 2013-05-19 MED ORDER — TERBINAFINE HCL 250 MG PO TABS: 250.0000 mg | ORAL_TABLET | Freq: Every day | ORAL | Status: DC

## 2013-05-19 NOTE — Telephone Encounter (Signed)
Patient informed. 

## 2013-05-31 ENCOUNTER — Other Ambulatory Visit: Payer: Self-pay

## 2013-05-31 MED ORDER — LEVOCETIRIZINE DIHYDROCHLORIDE 5 MG PO TABS: 5.0000 mg | ORAL_TABLET | Freq: Every evening | ORAL | Status: DC

## 2013-05-31 MED ORDER — CITALOPRAM HYDROBROMIDE 20 MG PO TABS: 20.0000 mg | ORAL_TABLET | Freq: Every morning | ORAL | Status: DC

## 2013-07-06 ENCOUNTER — Telehealth: Payer: Self-pay | Admitting: Internal Medicine

## 2013-07-06 ENCOUNTER — Other Ambulatory Visit: Payer: Self-pay

## 2013-07-06 MED ORDER — LEVOTHYROXINE SODIUM 88 MCG PO TABS: 88.0000 ug | ORAL_TABLET | Freq: Every evening | ORAL | Status: DC

## 2013-07-06 MED ORDER — CELECOXIB 200 MG PO CAPS: 200.0000 mg | ORAL_CAPSULE | Freq: Two times a day (BID) | ORAL | Status: DC

## 2013-07-06 NOTE — Telephone Encounter (Signed)
Please do 90 to new pharmacy for ALL chronic meds.

## 2013-07-29 ENCOUNTER — Encounter: Payer: Self-pay | Admitting: Internal Medicine

## 2013-08-01 ENCOUNTER — Ambulatory Visit (INDEPENDENT_AMBULATORY_CARE_PROVIDER_SITE_OTHER): Payer: BC Managed Care – PPO | Admitting: Internal Medicine

## 2013-08-01 ENCOUNTER — Encounter: Payer: Self-pay | Admitting: Internal Medicine

## 2013-08-01 VITALS — BP 136/90 | HR 84 | Temp 98.7°F | Wt 271.0 lb

## 2013-08-01 DIAGNOSIS — J019 Acute sinusitis, unspecified: Secondary | ICD-10-CM

## 2013-08-01 DIAGNOSIS — B351 Tinea unguium: Secondary | ICD-10-CM

## 2013-08-01 DIAGNOSIS — H109 Unspecified conjunctivitis: Secondary | ICD-10-CM

## 2013-08-01 MED ORDER — CLARITHROMYCIN 500 MG PO TABS: 500.0000 mg | ORAL_TABLET | Freq: Two times a day (BID) | ORAL | Status: DC

## 2013-08-01 MED ORDER — TERBINAFINE HCL 250 MG PO TABS: 250.0000 mg | ORAL_TABLET | Freq: Every day | ORAL | Status: DC

## 2013-08-01 MED ORDER — OFLOXACIN 0.3 % OP SOLN: 2.0000 [drp] | Freq: Four times a day (QID) | OPHTHALMIC | Status: DC

## 2013-08-01 MED ORDER — METHYLPREDNISOLONE ACETATE 80 MG/ML IJ SUSP
80.0000 mg | Freq: Once | INTRAMUSCULAR | Status: AC
  Administered 2013-08-01: 80 mg via INTRAMUSCULAR

## 2013-08-01 NOTE — Progress Notes (Signed)
   Subjective:    Patient ID: Mary Herman, female    DOB: Dec 16, 1960, 53 y.o.   MRN: [id number]  HPI Onset June 3 of sinusitis symptoms. She took Zyrtec without relief. Now has developed conjunctivitis left eye in addition to sinusitis symptoms. No fever. Also continues to have issues with toenail fungus right great toe nail. She has gotten some improvement with Lamisil which she has been on for 6 months. She will try another 90 day course. We have checked CBC and hepatic panel in March.    Review of Systems     Objective:   Physical Exam left eye conjunctivitis. Sounds nasally congested. Pharynx slightly injected. TMs slightly full but not red. Neck supple without adenopathy. Chest clear. An onychomycosis right great toenail         Assessment & Plan:     Onychomycosis  Sinusitis  Conjunctivitis left eye  Plan: Depo-Medrol 80 mg IM. Biaxin 500 mg twice daily for 10 days. Ocuflox ophthalmic solution 2 drops in both eyes 4 times daily for 5 days. Refill Lamisil 250 mg for 19 more days. If no improvement, she will need to see dermatologist

## 2013-08-01 NOTE — Patient Instructions (Addendum)
Refilled Lamisil 250 mg daily for an additional 90 days.  If Toenail fungus does not improve after this 3 month course she needs to see dermatologist. Biaxin 500 mg twice daily for 10 days. Depo-Medrol 80 mg IM given today. Use Ocuflox ophthalmic solution 2 drops in each eye 4 times daily for 5 days.

## 2013-08-07 ENCOUNTER — Other Ambulatory Visit: Payer: Self-pay

## 2013-08-07 MED ORDER — BUPROPION HCL 75 MG PO TABS: 75.0000 mg | ORAL_TABLET | Freq: Two times a day (BID) | ORAL | Status: DC

## 2013-10-01 ENCOUNTER — Other Ambulatory Visit: Payer: Self-pay | Admitting: Internal Medicine

## 2013-10-01 NOTE — Telephone Encounter (Signed)
Please refill once 

## 2013-10-02 ENCOUNTER — Other Ambulatory Visit: Payer: Self-pay

## 2013-10-24 ENCOUNTER — Other Ambulatory Visit: Payer: Self-pay | Admitting: Internal Medicine

## 2013-11-18 ENCOUNTER — Other Ambulatory Visit: Payer: Self-pay | Admitting: Internal Medicine

## 2013-11-19 IMAGING — CT CT ABD-PEL WO/W CM
4 of 10 series · 15 of 36 positions shown, 18 images · IV contrast (WATER)
Comparison: No similar prior exam is available at this institution
for comparison or on [HOSPITAL] PACS.

CLINICAL DATA: Gross hematuria for 4 months, low back pain. Prior
gastric bypass surgery.

EXAM:
CT ABDOMEN AND PELVIS WITHOUT AND WITH CONTRAST
TECHNIQUE: Multidetector CT imaging of the abdomen and pelvis was performed
without contrast material in one or both body regions, followed by
contrast material(s) and further sections in one or both body
regions.
CONTRAST:  125mL OMNIPAQUE IOHEXOL 300 MG/ML  SOLN

[Series 5: abd/pelvis with · axial · 0.98mm/px · z∈[-307,-142]mm · 2 of 100 slices shown, 5 images]
[im 34/100  soft-tissue]
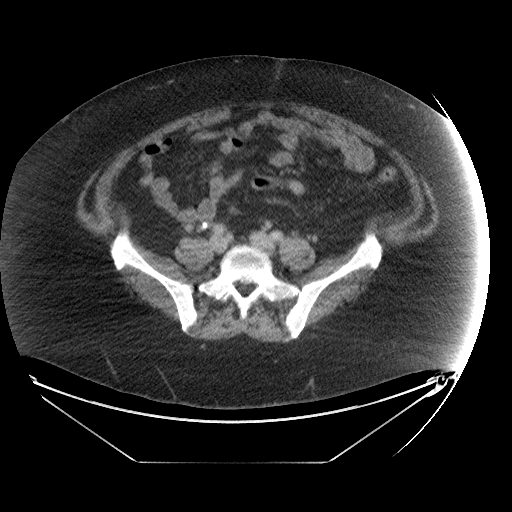
[im 34/100  lung]
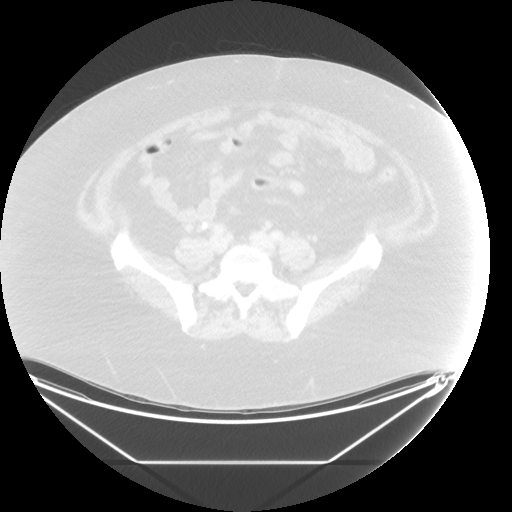
[im 34/100  bone]
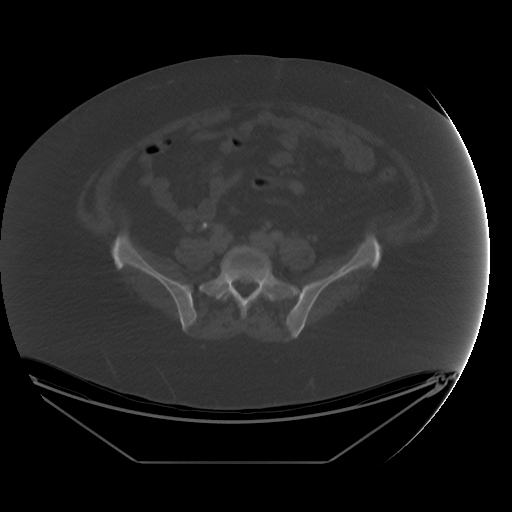
[im 67/100  soft-tissue]
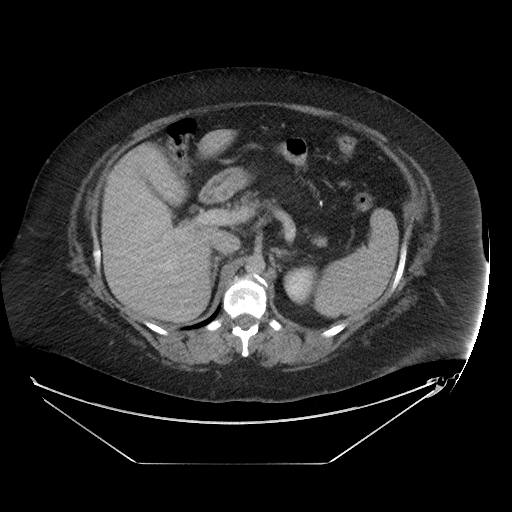
[im 67/100  lung]
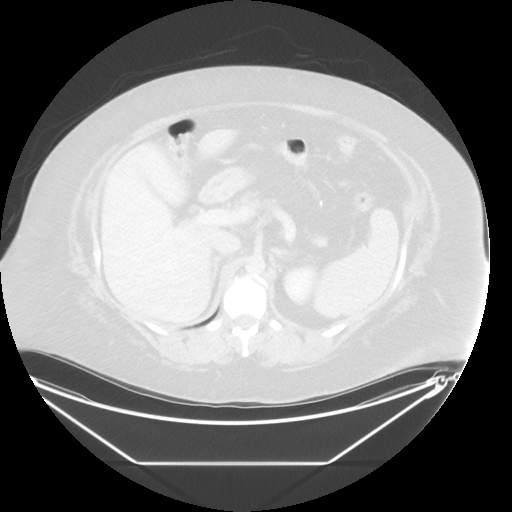

[Series 602: sagittal body · sagittal · 0.98mm/px · 5 of 193 slices shown (1 of 3)]
[im 33/193  soft-tissue]
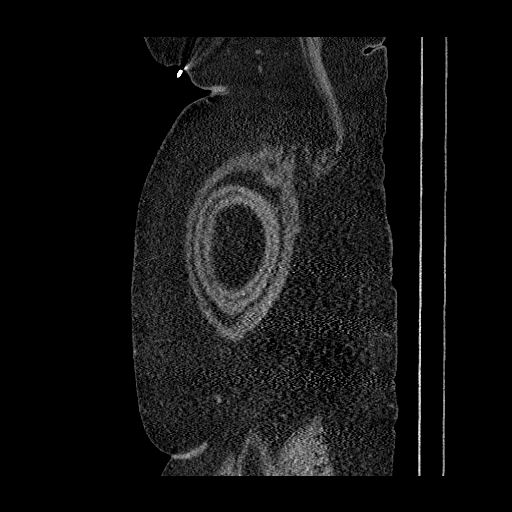
[im 65/193  soft-tissue]
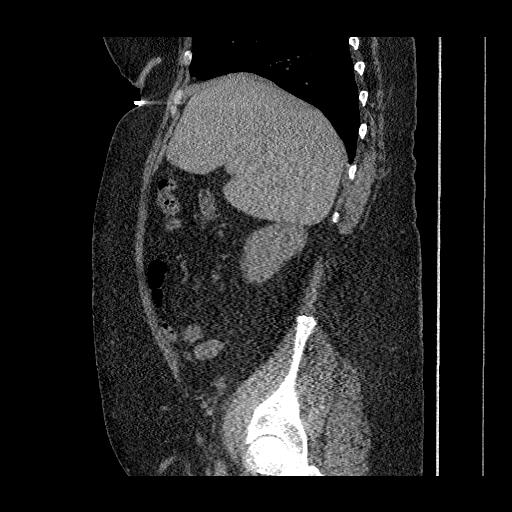
[im 97/193  soft-tissue]
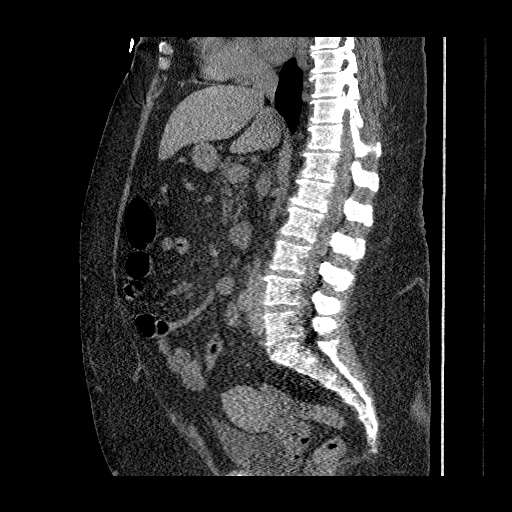
[im 129/193  soft-tissue]
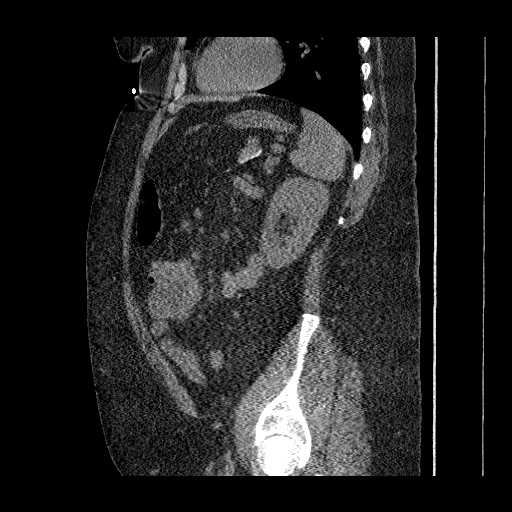
[im 161/193  soft-tissue]
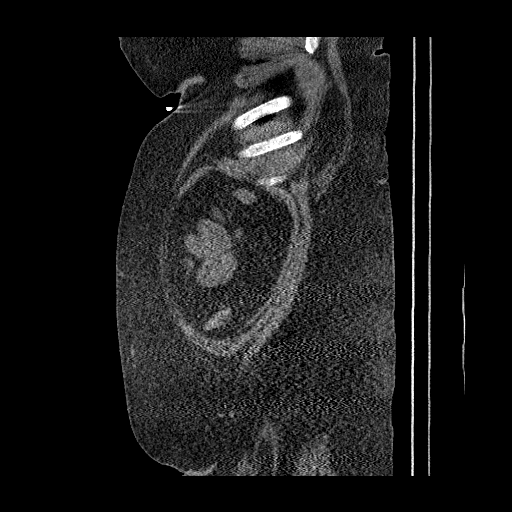

[Series 605: sagittal body · sagittal · 0.98mm/px · 5 of 193 slices shown (2 of 3)]
[im 33/193  soft-tissue]
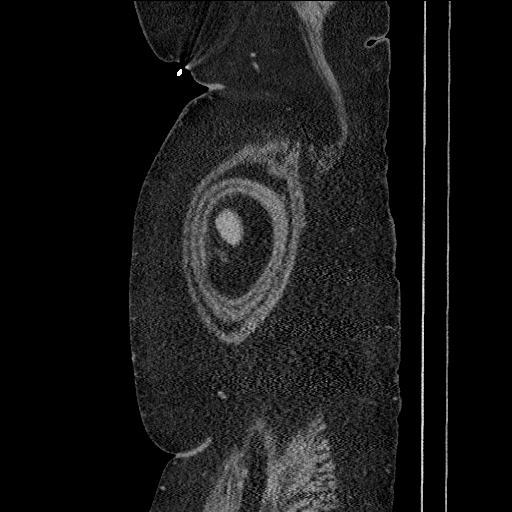
[im 65/193  soft-tissue]
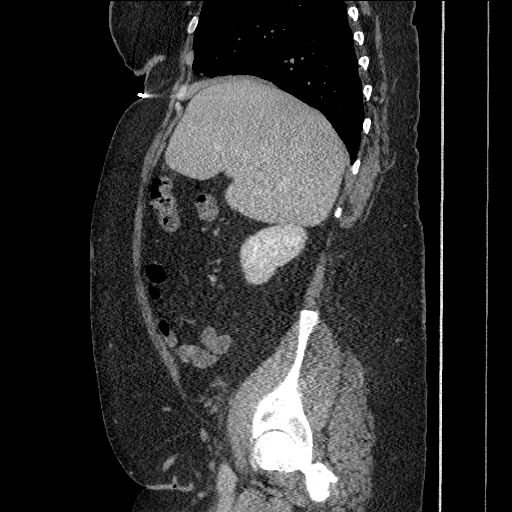
[im 97/193  soft-tissue]
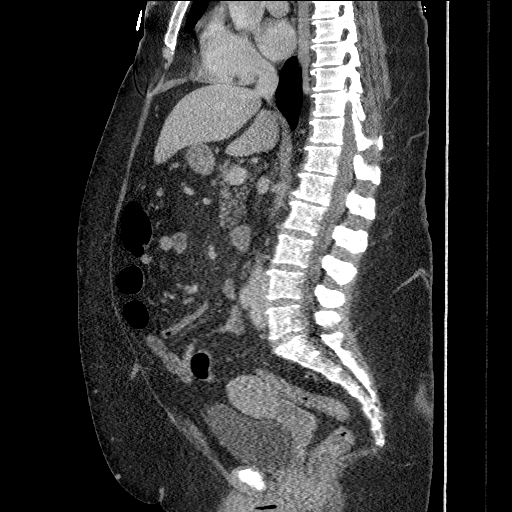
[im 129/193  soft-tissue]
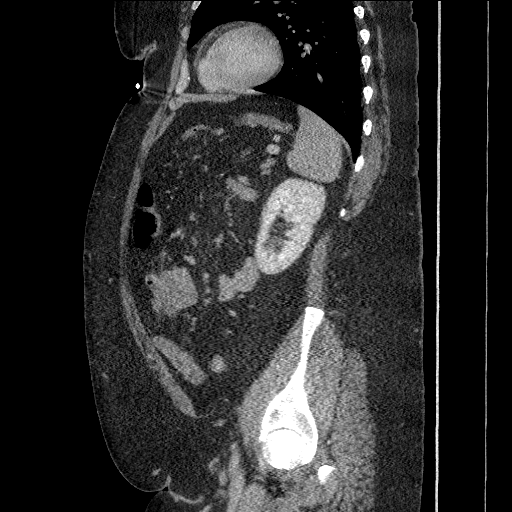
[im 161/193  soft-tissue]
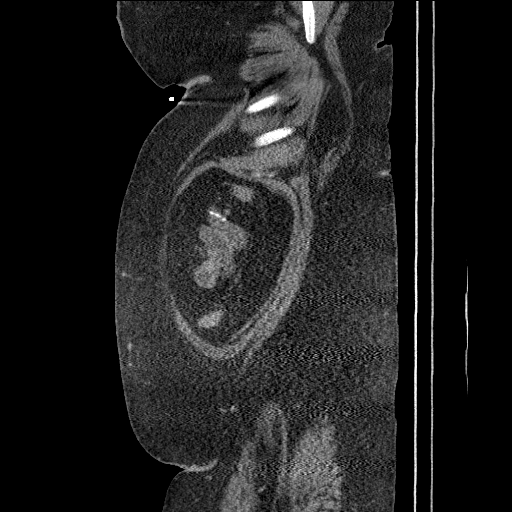

[Series 608: sagittal body · sagittal · 0.98mm/px · 3 of 200 slices shown (3 of 3)]
[im 34/200  soft-tissue]
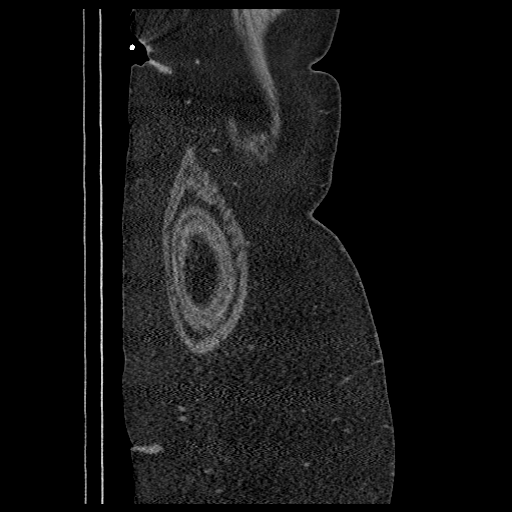
[im 67/200  soft-tissue]
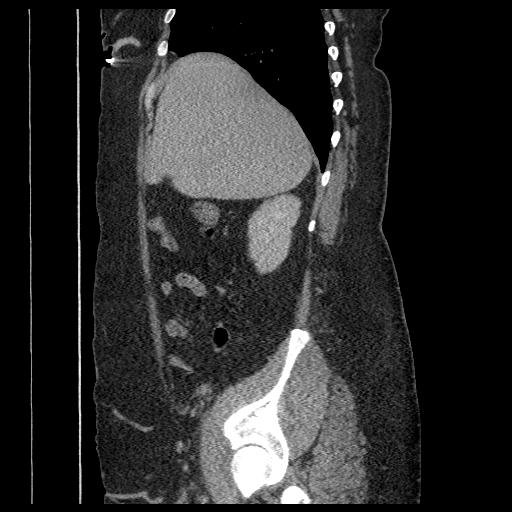
[im 100/200  soft-tissue]
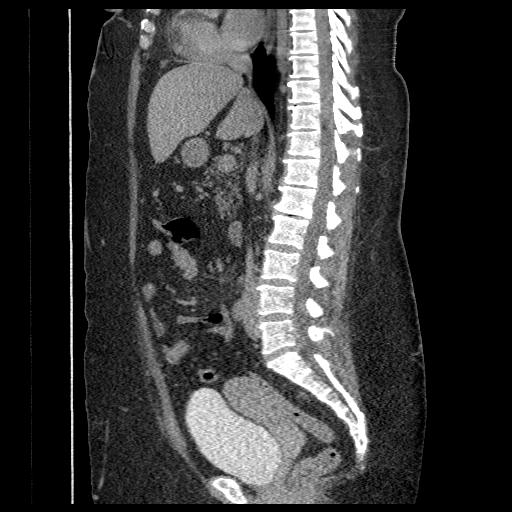

[15 of 36 positions shown; findings below may reference images not displayed]

FINDINGS: Curvilinear left lower lobe probable scarring or atelectasis is
noted. Lung bases are otherwise clear.

At unenhanced imaging, there is a 6 mm nonobstructing left mid renal
calculus identified image 39. Left-sided extra renal pelvis
incidentally noted, with minimal surrounding stranding and
subjective urothelial enhancement that could suggest a transient
ball-valve type obstruction phenomenon by the left mid renal
calculus. There is mild fullness of the right ureter to the level of
the radiopaque 5 mm mid ureteral calculus image 61.

Evidence of gastric bypass without complication. Minimal haziness at
the root of the mesentery with small mesenteric lymph nodes, largest
6 mm image 40 series 9, is nonspecific. Apart from the above
described mid right ureteral radiopaque calculus, there is no
urothelial filling defect on either side on delayed imaging. The
bladder is normal.

Liver, adrenal glands, spleen, and pancreas are normal. No bowel
wall thickening or focal segmental dilatation. Uterus and ovaries
are normal. Appendix is normal. No free air or fluid. No
lymphadenopathy. No acute osseous finding.

Imaging is mildly degraded by streak artifact from patient body
habitus.
IMPRESSION: 5 mm right mid ureteral calculus producing mild right hydroureter.

Currently nonobstructing 6 mm left mid renal calculus but with mild
urothelial enhancement involving left extra renal pelvis that could
indicate transient ball-valve type obstruction phenomenon. Correlate
with urinalysis to determine whether urinary tract infection could
also account for the degree of urothelial enhancement.

## 2013-11-19 NOTE — Telephone Encounter (Signed)
Refill once 

## 2013-11-20 NOTE — Telephone Encounter (Signed)
Valium called into [facility]. 

## 2013-12-12 ENCOUNTER — Ambulatory Visit (INDEPENDENT_AMBULATORY_CARE_PROVIDER_SITE_OTHER): Payer: BC Managed Care – PPO | Admitting: Internal Medicine

## 2013-12-12 DIAGNOSIS — Z23 Encounter for immunization: Secondary | ICD-10-CM

## 2013-12-19 ENCOUNTER — Other Ambulatory Visit: Payer: Self-pay | Admitting: Internal Medicine

## 2013-12-20 ENCOUNTER — Other Ambulatory Visit: Payer: Self-pay

## 2013-12-20 MED ORDER — DIAZEPAM 5 MG PO TABS: ORAL_TABLET | ORAL | Status: DC

## 2013-12-25 ENCOUNTER — Encounter: Payer: Self-pay | Admitting: Internal Medicine

## 2013-12-31 ENCOUNTER — Other Ambulatory Visit: Payer: Self-pay | Admitting: Internal Medicine

## 2014-01-03 IMAGING — CR DG ABDOMEN 1V
2 series · 2 of 2 positions shown · non-contrast
Comparison: 12/27/2012 and 11/18/2012

CLINICAL DATA: Urolithiasis. Preop for lithotripsy.

EXAM:
ABDOMEN - 1 VIEW

[t abdomen supine (1 of 2)]
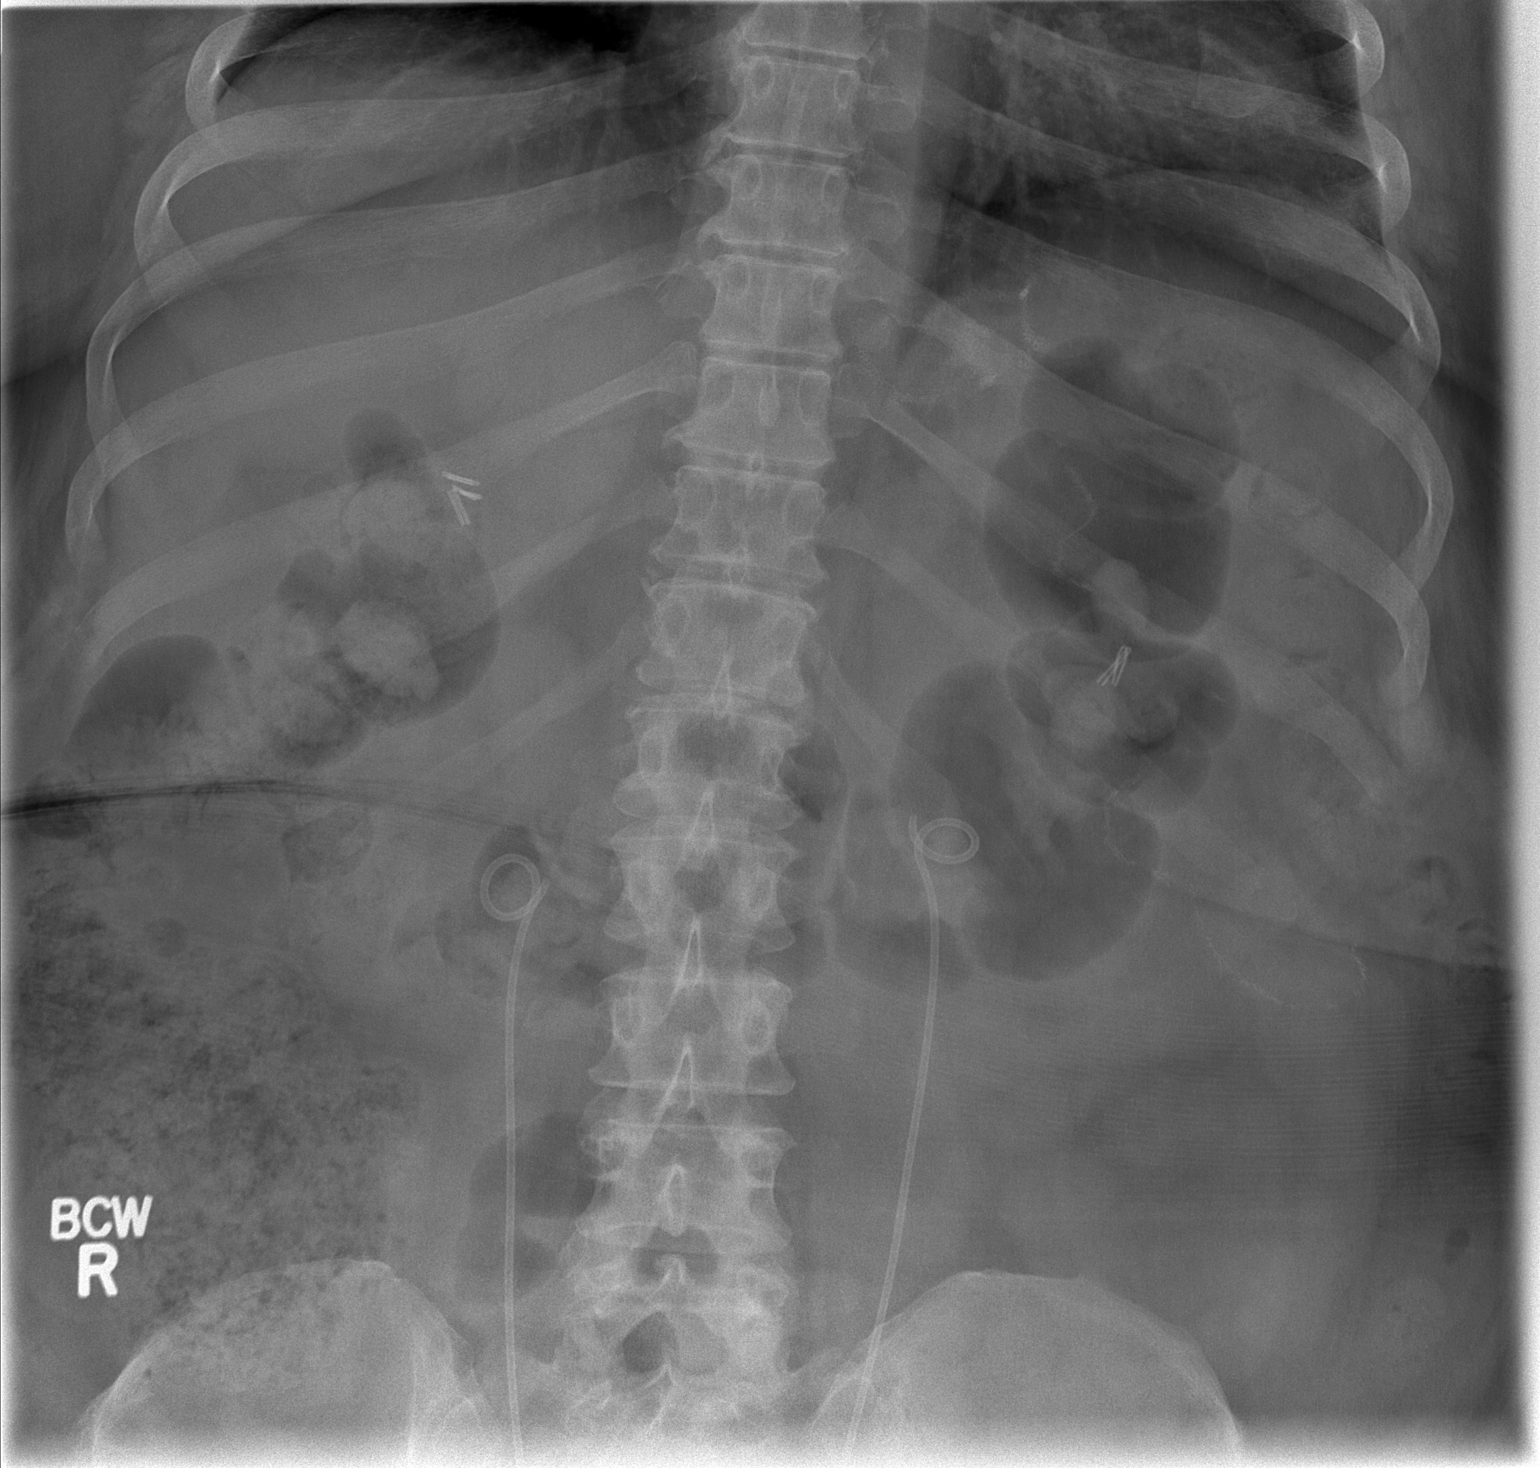

[t abdomen supine (2 of 2)]
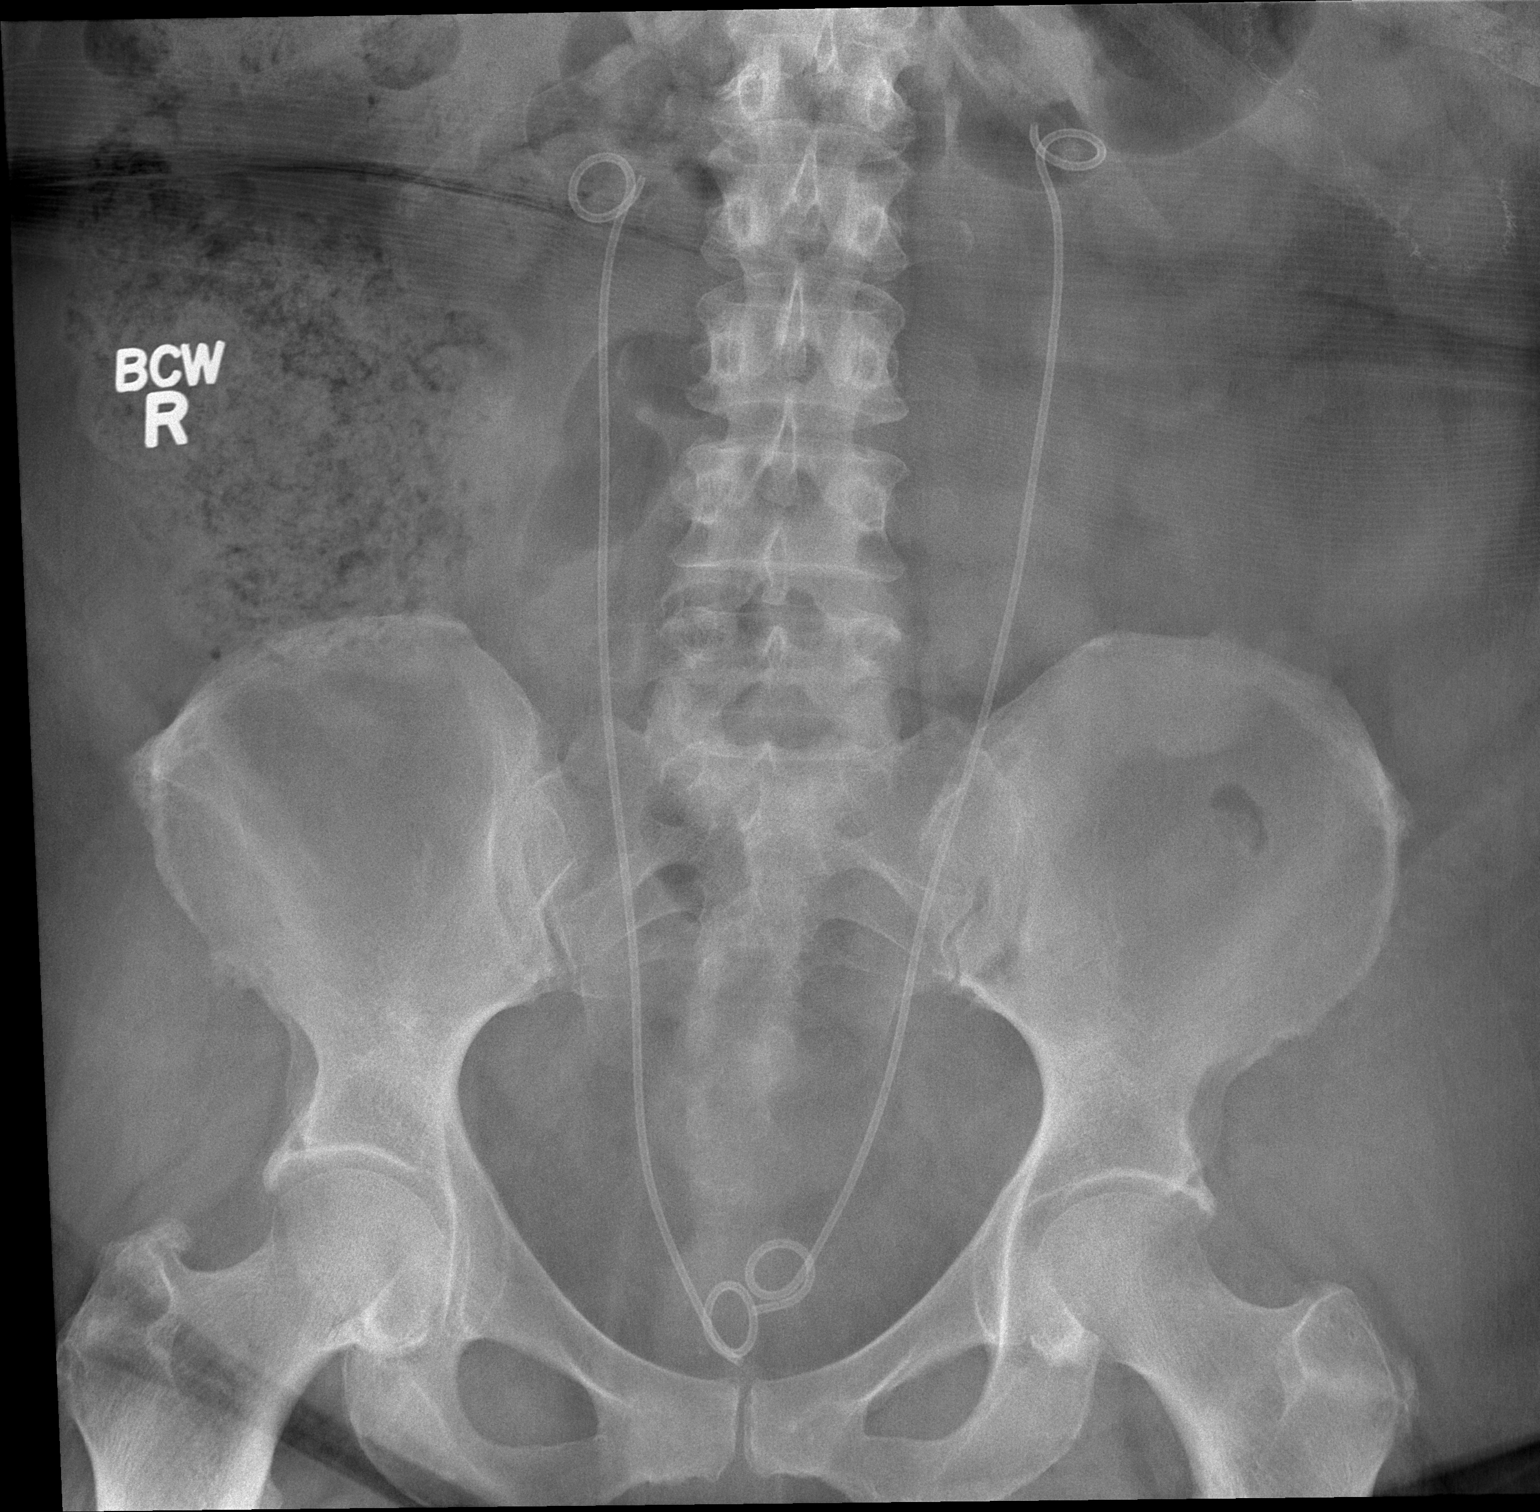

[2 of 2 positions shown; findings below may reference images not displayed]

FINDINGS: Bilateral internal ureteral stents are seen in appropriate position.
Visualization of kidneys is limited by overlying stool. 5 mm
radiodensity seen in the expected region of the left renal pelvis
adjacent to the proximal pigtail loop of the ureteral stent is
suspicious for the left renal calculus seen on previous CT.

A 3 mm radiodensity is seen in the urinary bladder adjacent to the
distal pigtail loop of the right ureteral stent. No definite
radiopaque calculi seen along the courses of the ureters.
IMPRESSION: Bilateral ureteral stents in appropriate position. Visualization of
kidneys limited by overlying stool.

Probable 5 mm calculus in left renal pelvis and 3 mm calculus in
urinary bladder.

## 2014-01-05 ENCOUNTER — Ambulatory Visit (INDEPENDENT_AMBULATORY_CARE_PROVIDER_SITE_OTHER): Payer: BC Managed Care – PPO | Admitting: Gynecology

## 2014-01-05 ENCOUNTER — Encounter: Payer: Self-pay | Admitting: Gynecology

## 2014-01-05 DIAGNOSIS — N926 Irregular menstruation, unspecified: Secondary | ICD-10-CM

## 2014-01-05 DIAGNOSIS — N921 Excessive and frequent menstruation with irregular cycle: Secondary | ICD-10-CM

## 2014-01-05 NOTE — Progress Notes (Signed)
Mary Herman 04-27-1960 [id number]        53 y.o.  G1P1000 Presents complaining of irregular heavy menses. Patient notes over the last year or so on and off bleeding usually light without regularity and then most recently mid October had a heavy bleed requiring frequent pad changes times several days. This has now resolved. Is not having significant hot flashes night sweats or other menopausal symptoms. Has been obtaining her routine GYN care through her primary physician with reported last Pap smear December 2014.  Vasectomy birth control.  Past medical history,surgical history, problem list, medications, allergies, family history and social history were all reviewed and documented in the EPIC chart.  Directed ROS with pertinent positives and negatives documented in the history of present illness/assessment and plan.  Exam: [place] assistant General appearance:  Normal Abdomen obese, soft nontender without masses guarding rebound Pelvic external bus vagina normal. Cervix grossly normal. Uterus grossly normal size midline mobile nontender. Adnexa without gross masses or tenderness.  Assessment/Plan:  53 y.o. G1P1000 with irregular bleeding over the past year. Check baseline CBC TSH FSH. Schedule sonohysterogram to rule out endometrial abnormalities such as polyps as well as allow for endometrial sampling.  Various scenarios were reviewed with her to include surgery if anatomic abnormalities found, hormonal manipulation, Mirena IUD bleeding suppression.  We'll further discuss after the blood test results and ultrasound.     Anastasio Auerbach MD, 11:04 AM 01/05/2014

## 2014-01-05 NOTE — Patient Instructions (Signed)
Follow up for blood test results and the ultrasound as scheduled.

## 2014-01-06 LAB — CBC WITH DIFFERENTIAL/PLATELET
Basophils Absolute: 0 10*3/uL (ref 0.0–0.1)
Basophils Relative: 0 % (ref 0–1)
Eosinophils Absolute: 0.2 10*3/uL (ref 0.0–0.7)
Eosinophils Relative: 3 % (ref 0–5)
HEMATOCRIT: 41.6 % (ref 36.0–46.0)
HEMOGLOBIN: 13.6 g/dL (ref 12.0–15.0)
LYMPHS ABS: 1.6 10*3/uL (ref 0.7–4.0)
LYMPHS PCT: 29 % (ref 12–46)
MCH: 27.9 pg (ref 26.0–34.0)
MCHC: 32.7 g/dL (ref 30.0–36.0)
MCV: 85.2 fL (ref 78.0–100.0)
MONO ABS: 0.3 10*3/uL (ref 0.1–1.0)
Monocytes Relative: 6 % (ref 3–12)
Neutro Abs: 3.5 10*3/uL (ref 1.7–7.7)
Neutrophils Relative %: 62 % (ref 43–77)
Platelets: 253 10*3/uL (ref 150–400)
RBC: 4.88 MIL/uL (ref 3.87–5.11)
RDW: 13.7 % (ref 11.5–15.5)
WBC: 5.6 10*3/uL (ref 4.0–10.5)

## 2014-01-06 LAB — FOLLICLE STIMULATING HORMONE: FSH: 52.8 m[IU]/mL

## 2014-01-06 LAB — TSH: TSH: 0.235 u[IU]/mL — ABNORMAL LOW (ref 0.350–4.500)

## 2014-01-12 ENCOUNTER — Other Ambulatory Visit: Payer: Self-pay | Admitting: Gynecology

## 2014-01-12 ENCOUNTER — Ambulatory Visit: Payer: BC Managed Care – PPO | Admitting: Internal Medicine

## 2014-01-12 DIAGNOSIS — N926 Irregular menstruation, unspecified: Secondary | ICD-10-CM

## 2014-01-12 DIAGNOSIS — N921 Excessive and frequent menstruation with irregular cycle: Secondary | ICD-10-CM

## 2014-01-15 ENCOUNTER — Encounter: Payer: Self-pay | Admitting: Internal Medicine

## 2014-01-15 ENCOUNTER — Ambulatory Visit (INDEPENDENT_AMBULATORY_CARE_PROVIDER_SITE_OTHER): Payer: BC Managed Care – PPO | Admitting: Internal Medicine

## 2014-01-15 VITALS — BP 112/88 | HR 87 | Temp 98.2°F | Ht 63.0 in | Wt 271.0 lb

## 2014-01-15 DIAGNOSIS — Z87442 Personal history of urinary calculi: Secondary | ICD-10-CM

## 2014-01-15 DIAGNOSIS — E039 Hypothyroidism, unspecified: Secondary | ICD-10-CM

## 2014-01-15 DIAGNOSIS — R109 Unspecified abdominal pain: Secondary | ICD-10-CM

## 2014-01-15 LAB — POCT URINALYSIS DIPSTICK
BILIRUBIN UA: NEGATIVE
GLUCOSE UA: NEGATIVE
KETONES UA: NEGATIVE
Leukocytes, UA: NEGATIVE
Nitrite, UA: NEGATIVE
PH UA: 6.5
PROTEIN UA: NEGATIVE
SPEC GRAV UA: 1.02
Urobilinogen, UA: NEGATIVE

## 2014-01-15 MED ORDER — LEVOTHYROXINE SODIUM 75 MCG PO TABS: 75.0000 ug | ORAL_TABLET | Freq: Every day | ORAL | Status: DC

## 2014-01-15 MED ORDER — HYDROCODONE-ACETAMINOPHEN 5-325 MG PO TABS: 1.0000 | ORAL_TABLET | Freq: Four times a day (QID) | ORAL | Status: DC | PRN

## 2014-01-23 ENCOUNTER — Other Ambulatory Visit: Payer: Self-pay | Admitting: Internal Medicine

## 2014-01-23 NOTE — Telephone Encounter (Signed)
Refill for on e year please

## 2014-01-23 NOTE — Telephone Encounter (Signed)
Patient Wellbutrin sent to pharmacy.

## 2014-01-26 ENCOUNTER — Telehealth: Payer: Self-pay | Admitting: *Deleted

## 2014-01-26 ENCOUNTER — Encounter: Payer: Self-pay | Admitting: Gynecology

## 2014-01-26 ENCOUNTER — Ambulatory Visit (INDEPENDENT_AMBULATORY_CARE_PROVIDER_SITE_OTHER): Payer: BC Managed Care – PPO

## 2014-01-26 ENCOUNTER — Other Ambulatory Visit: Payer: Self-pay | Admitting: Gynecology

## 2014-01-26 ENCOUNTER — Ambulatory Visit (INDEPENDENT_AMBULATORY_CARE_PROVIDER_SITE_OTHER): Payer: BC Managed Care – PPO | Admitting: Gynecology

## 2014-01-26 DIAGNOSIS — N921 Excessive and frequent menstruation with irregular cycle: Secondary | ICD-10-CM

## 2014-01-26 DIAGNOSIS — N8320 Unspecified ovarian cysts: Secondary | ICD-10-CM

## 2014-01-26 DIAGNOSIS — N926 Irregular menstruation, unspecified: Secondary | ICD-10-CM

## 2014-01-26 DIAGNOSIS — N83202 Unspecified ovarian cyst, left side: Secondary | ICD-10-CM

## 2014-01-26 MED ORDER — MEDROXYPROGESTERONE ACETATE 10 MG PO TABS: 10.0000 mg | ORAL_TABLET | Freq: Every day | ORAL | Status: DC

## 2014-01-26 NOTE — Patient Instructions (Addendum)
Office will call you with biopsy results. We will plan on keeping a menstrual calendar over the next several months and see how the bleeding does. If significant irregular bleeding continues then call.

## 2014-01-26 NOTE — Telephone Encounter (Signed)
-----   Message from Anastasio Auerbach, MD sent at 01/26/2014  9:09 AM EST ----- I sent a prescription for Provera 10 mg #20 through to her pharmacy but I put no refills on it. I meant to put 3 refills on it. If you could call and at that to the prescription. Thanks

## 2014-01-26 NOTE — Telephone Encounter (Signed)
I called refills into pharmacy.

## 2014-01-26 NOTE — Progress Notes (Signed)
RUMOR SUN 02/24/1960 [id number]        53 y.o.  G1P1000 presents for sonohysterogram due to irregular bleeding.  Patient notes over the last year or so on and off bleeding usually light without regularity and then most recently mid October had a heavy bleed requiring frequent pad changes times several days. This has now resolved. Is not having significant hot flashes night sweats or other menopausal symptoms.  Recent lab work shows hemoglobin 13.6 FSH 52.8 and TSH 0.235. She just had her Synthroid dose adjusted by her primary physician.   Past medical history,surgical history, problem list, medications, allergies, family history and social history were all reviewed and documented in the EPIC chart.  Directed ROS with pertinent positives and negatives documented in the history of present illness/assessment and plan.  Exam: [place] assistant General appearance:  Normal External BUS vagina normal. Cervix normal.  Ultrasound both transabdominal and transvaginal shows uterus normal size and echotexture. Endometrial echo 8.8 mm. Right ovary not visualized but right adnexa without abnormalities. Left ovary normal. Cul-de-sac negative.  Sonohysterogram performed, sterile technique, single-tooth tenaculum anterior lip necessary to stabilize and straightened the cervix with subsequent sounding. Sonohysterogram catheter subsequently easily placed, good distention with no abnormalities. Endometrial sample taken. Patient tolerated well.  Assessment/Plan:  53 y.o. G1P1000 with irregular perimenopausal bleeding. Patient will follow up for biopsy results. Will keep menstrual calendar for now and follow. As long as less frequent but regular menses will monitor. I did ask her if she goes more than 6-8 weeks without a menses to take a Provera withdrawal 10 mg 10 days to shed the lining to prevent the heavy hemorrhage type bleed like she had in October after months of no menses. She'll do this over this next  year every 6-8 weeks as needed. Provera 10 mg #20 tablets with several refills provided.  If significant irregular bleeding or significant menopausal symptoms develop then she'll represent for evaluation.     Anastasio Auerbach MD, 8:54 AM 01/26/2014

## 2014-02-06 ENCOUNTER — Encounter: Payer: Self-pay | Admitting: Internal Medicine

## 2014-02-06 ENCOUNTER — Ambulatory Visit (INDEPENDENT_AMBULATORY_CARE_PROVIDER_SITE_OTHER): Payer: BC Managed Care – PPO | Admitting: Internal Medicine

## 2014-02-06 VITALS — BP 122/76 | HR 91 | Temp 98.2°F | Wt 270.0 lb

## 2014-02-06 DIAGNOSIS — J209 Acute bronchitis, unspecified: Secondary | ICD-10-CM

## 2014-02-06 MED ORDER — METHYLPREDNISOLONE ACETATE 80 MG/ML IJ SUSP
80.0000 mg | Freq: Once | INTRAMUSCULAR | Status: AC
Start: 2014-02-06 — End: 2014-02-06
  Administered 2014-02-06: 80 mg via INTRAMUSCULAR

## 2014-02-06 MED ORDER — CLARITHROMYCIN 500 MG PO TABS: 500.0000 mg | ORAL_TABLET | Freq: Two times a day (BID) | ORAL | Status: DC

## 2014-02-06 NOTE — Progress Notes (Signed)
   Subjective:    Patient ID: Mary Herman, female    DOB: [birth year], 53 y.o.   MRN: [id number]  HPI  3 week history of cough. Cough is productive of discolored sputum. No sore throat. No fever. No shaking chills. No significant shortness of breath. History of hypothyroidism.    Review of Systems     Objective:   Physical Exam  Skin warm and dry. Nodes none. Pharynx slightly injected. TMs are clear. Neck supple. Chest clear to auscultation without rales or wheezing. Deep congested cough.      Assessment & Plan:      Acute Bronchitis  Plan: Depomedrol 80 mg IM given in office. Biaxin 500 mg bid by mouth x 10 days.

## 2014-03-12 ENCOUNTER — Other Ambulatory Visit: Payer: 59 | Admitting: Internal Medicine

## 2014-03-12 DIAGNOSIS — E039 Hypothyroidism, unspecified: Secondary | ICD-10-CM

## 2014-03-12 LAB — TSH: TSH: 1.288 u[IU]/mL (ref 0.350–4.500)

## 2014-03-13 ENCOUNTER — Encounter: Payer: Self-pay | Admitting: Internal Medicine

## 2014-03-13 ENCOUNTER — Ambulatory Visit (INDEPENDENT_AMBULATORY_CARE_PROVIDER_SITE_OTHER): Payer: 59 | Admitting: Internal Medicine

## 2014-03-13 VITALS — BP 124/78 | HR 98 | Temp 97.3°F | Wt 267.5 lb

## 2014-03-13 DIAGNOSIS — E039 Hypothyroidism, unspecified: Secondary | ICD-10-CM

## 2014-03-13 DIAGNOSIS — F439 Reaction to severe stress, unspecified: Secondary | ICD-10-CM

## 2014-03-13 DIAGNOSIS — Z658 Other specified problems related to psychosocial circumstances: Secondary | ICD-10-CM

## 2014-03-13 NOTE — Progress Notes (Signed)
   Subjective:    Patient ID: Mary Herman, female    DOB: 06-30-1960, 54 y.o.   MRN: [id number]  HPI  Last time TSH was checked which was by the gynecologist in November 2015, TSH was low. We decreased her thyroid replacement dose from 0.088 mg daily to 0.075 mg daily and she is now here for follow-up. She been under a lot of stress. Can't see any difference in thyroid dosage really. 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 That trailer is being occupied by family. They need to move in the next few weeks and it has been stressful. She has by him to take it night and takes it almost every night. Feels that she's managing the stress okay. No further recurrence of respiratory infections. Was seen in December for bronchitis.    Review of Systems     Objective:   Physical Exam  Spent 10 minutes speaking with patient about stress and hypothyroidism. No thyromegaly.      Assessment & Plan:  Situational stress  Hypothyroidism  Plan: Continue levothyroxine 0.075 milligrams daily and recheck TSH in 6 months. Continue Valium at bedtime for stress.

## 2014-03-13 NOTE — Patient Instructions (Signed)
Continue levothyroxin 0.075 mg daily. Recheck TSH in 6 months.

## 2014-03-13 NOTE — Patient Instructions (Signed)
Take Biaxin 500 mg twice daily for 10 days. Depo-Medrol IM given in office today.

## 2014-03-16 ENCOUNTER — Other Ambulatory Visit: Payer: Self-pay | Admitting: Internal Medicine

## 2014-03-24 ENCOUNTER — Other Ambulatory Visit: Payer: Self-pay | Admitting: Internal Medicine

## 2014-03-30 ENCOUNTER — Other Ambulatory Visit: Payer: Self-pay | Admitting: Internal Medicine

## 2014-03-31 NOTE — Progress Notes (Signed)
   Subjective:    Patient ID: Mary Herman, female    DOB: [birth year], 54 y.o.   MRN: [id number]  HPI  Patient is here because she recently saw GYN earlier this month at which time [facility] was elevated 52.8. TSH was low at 0.235. She has a history of hypothyroidism and is on thyroid replacement therapy. GYN suggested she see me to adjust thyroid replacement medication. She feels pretty well but is under a lot of situational stress. Says she has been passing a kidney stone recently. History of nephrolithiasis. Urinalysis shows occult blood. Having back pain.    Review of Systems     Objective:   Physical Exam  No thyromegaly.      Assessment & Plan:  Flank pain secondary to urolithiasis with hematuria  Hypothyroidism-TSH is low and thyroid replacement medication dose needs to be decreased  Plan: Decrease levothyroxine to 0.075 mg daily and follow-up in January. Hydrocodone/APAP 5/325 given for flank pain and nephrolithiasis.

## 2014-03-31 NOTE — Patient Instructions (Addendum)
Decrease levothyroxine to 0.075 mg from 0.088 mg daily and return in January for follow-up. Take hydrocodone/APAP for flank pain. Follow-up with urologist as needed

## 2014-04-07 ENCOUNTER — Other Ambulatory Visit: Payer: Self-pay | Admitting: Gynecology

## 2014-05-20 ENCOUNTER — Other Ambulatory Visit: Payer: Self-pay | Admitting: Internal Medicine

## 2014-05-20 NOTE — Telephone Encounter (Signed)
I thought this was recently refilled. Should be #90 with one refill if only taking hs. Please check with pt.

## 2014-05-21 ENCOUNTER — Other Ambulatory Visit: Payer: Self-pay | Admitting: *Deleted

## 2014-05-21 MED ORDER — CYCLOBENZAPRINE HCL 10 MG PO TABS: 10.0000 mg | ORAL_TABLET | Freq: Every day | ORAL | Status: DC

## 2014-05-21 NOTE — Telephone Encounter (Signed)
Refill called in to pharmacy for flexeril

## 2014-05-25 ENCOUNTER — Ambulatory Visit (INDEPENDENT_AMBULATORY_CARE_PROVIDER_SITE_OTHER): Payer: 59 | Admitting: Internal Medicine

## 2014-05-25 ENCOUNTER — Encounter: Payer: Self-pay | Admitting: Internal Medicine

## 2014-05-25 VITALS — BP 128/76 | HR 102 | Temp 97.8°F | Wt 264.0 lb

## 2014-05-25 DIAGNOSIS — J069 Acute upper respiratory infection, unspecified: Secondary | ICD-10-CM | POA: Diagnosis not present

## 2014-05-25 MED ORDER — LEVOFLOXACIN 500 MG PO TABS: 500.0000 mg | ORAL_TABLET | Freq: Every day | ORAL | Status: DC

## 2014-05-25 MED ORDER — HYDROCODONE-ACETAMINOPHEN 5-325 MG PO TABS: ORAL_TABLET | ORAL | Status: DC

## 2014-05-25 MED ORDER — PREDNISONE 10 MG PO TABS
ORAL_TABLET | ORAL | Status: DC
Start: 2014-05-25 — End: 2014-10-12

## 2014-05-25 MED ORDER — HYDROCODONE-HOMATROPINE 5-1.5 MG/5ML PO SYRP: 5.0000 mL | ORAL_SOLUTION | Freq: Three times a day (TID) | ORAL | Status: DC | PRN

## 2014-05-25 NOTE — Progress Notes (Signed)
   Subjective:    Patient ID: Mary Herman, female    DOB: 22-May-1960, 54 y.o.   MRN: [id number]  HPI In today with URI symptoms for well over one week. Says she was unable to get here earlier. Has cough and congestion. No fever or shaking chills. Some wheezing. Unfortunately is no longer employed. Says last day at work was March 30. Denies sore throat.    Review of Systems     Objective:   Physical Exam  Skin warm and dry. Nodes none. TMs are clear. Pharynx only very slightly injected. Neck is supple without adenopathy. Chest clear to auscultation but she sounds nasally congested and hoarse      Assessment & Plan:  Acute URI  Plan: Sterapred DS 10 mg 6 day dosepak. Levaquin 500 milligrams daily for 10 days. Hycodan 1 teaspoon by mouth every 8 hours when necessary cough.

## 2014-06-04 NOTE — Patient Instructions (Signed)
Take prednisone and Levaquin as directed and completed. Take Hycodan as needed for cough.

## 2014-07-06 ENCOUNTER — Other Ambulatory Visit: Payer: Self-pay | Admitting: Internal Medicine

## 2014-07-13 ENCOUNTER — Encounter: Payer: Self-pay | Admitting: Internal Medicine

## 2014-07-22 ENCOUNTER — Other Ambulatory Visit: Payer: Self-pay | Admitting: Internal Medicine

## 2014-07-22 NOTE — Telephone Encounter (Signed)
Refill once for 30 days

## 2014-07-24 ENCOUNTER — Other Ambulatory Visit: Payer: Self-pay | Admitting: *Deleted

## 2014-07-24 MED ORDER — DIAZEPAM 5 MG PO TABS: ORAL_TABLET | ORAL | Status: DC

## 2014-08-15 ENCOUNTER — Other Ambulatory Visit: Payer: Self-pay | Admitting: Internal Medicine

## 2014-08-20 ENCOUNTER — Other Ambulatory Visit: Payer: Self-pay | Admitting: Internal Medicine

## 2014-08-20 NOTE — Telephone Encounter (Signed)
Refill once 

## 2014-10-07 ENCOUNTER — Other Ambulatory Visit: Payer: Self-pay | Admitting: Internal Medicine

## 2014-10-11 ENCOUNTER — Encounter: Payer: Self-pay | Admitting: Internal Medicine

## 2014-10-12 ENCOUNTER — Encounter: Payer: Self-pay | Admitting: Internal Medicine

## 2014-10-12 ENCOUNTER — Ambulatory Visit (INDEPENDENT_AMBULATORY_CARE_PROVIDER_SITE_OTHER): Payer: 59 | Admitting: Internal Medicine

## 2014-10-12 VITALS — BP 136/78 | HR 108 | Temp 98.3°F | Ht 63.0 in | Wt 280.0 lb

## 2014-10-12 DIAGNOSIS — Z0000 Encounter for general adult medical examination without abnormal findings: Secondary | ICD-10-CM | POA: Diagnosis not present

## 2014-10-12 DIAGNOSIS — E039 Hypothyroidism, unspecified: Secondary | ICD-10-CM

## 2014-10-12 DIAGNOSIS — F32A Depression, unspecified: Secondary | ICD-10-CM

## 2014-10-12 DIAGNOSIS — Z87442 Personal history of urinary calculi: Secondary | ICD-10-CM | POA: Diagnosis not present

## 2014-10-12 DIAGNOSIS — R739 Hyperglycemia, unspecified: Secondary | ICD-10-CM | POA: Diagnosis not present

## 2014-10-12 DIAGNOSIS — M545 Low back pain, unspecified: Secondary | ICD-10-CM

## 2014-10-12 DIAGNOSIS — F329 Major depressive disorder, single episode, unspecified: Secondary | ICD-10-CM | POA: Diagnosis not present

## 2014-10-12 DIAGNOSIS — Z8639 Personal history of other endocrine, nutritional and metabolic disease: Secondary | ICD-10-CM | POA: Diagnosis not present

## 2014-10-12 DIAGNOSIS — J309 Allergic rhinitis, unspecified: Secondary | ICD-10-CM | POA: Diagnosis not present

## 2014-10-12 DIAGNOSIS — R8290 Unspecified abnormal findings in urine: Secondary | ICD-10-CM

## 2014-10-12 DIAGNOSIS — E669 Obesity, unspecified: Secondary | ICD-10-CM

## 2014-10-12 DIAGNOSIS — R7302 Impaired glucose tolerance (oral): Secondary | ICD-10-CM | POA: Diagnosis not present

## 2014-10-12 DIAGNOSIS — Z9884 Bariatric surgery status: Secondary | ICD-10-CM | POA: Diagnosis not present

## 2014-10-12 DIAGNOSIS — G8929 Other chronic pain: Secondary | ICD-10-CM

## 2014-10-12 LAB — POCT URINALYSIS DIPSTICK
BILIRUBIN UA: NEGATIVE
GLUCOSE UA: NEGATIVE
Ketones, UA: NEGATIVE
NITRITE UA: NEGATIVE
Protein, UA: NEGATIVE
Spec Grav, UA: 1.02
UROBILINOGEN UA: NEGATIVE
pH, UA: 5

## 2014-10-12 MED ORDER — BUPROPION HCL 75 MG PO TABS: 150.0000 mg | ORAL_TABLET | Freq: Two times a day (BID) | ORAL | Status: DC

## 2014-10-12 MED ORDER — HYDROCODONE-ACETAMINOPHEN 5-325 MG PO TABS: ORAL_TABLET | ORAL | Status: DC

## 2014-10-12 MED ORDER — DIAZEPAM 5 MG PO TABS: 5.0000 mg | ORAL_TABLET | Freq: Every day | ORAL | Status: DC

## 2014-10-12 MED ORDER — CELECOXIB 200 MG PO CAPS: 200.0000 mg | ORAL_CAPSULE | Freq: Two times a day (BID) | ORAL | Status: DC

## 2014-10-12 MED ORDER — CITALOPRAM HYDROBROMIDE 20 MG PO TABS: 20.0000 mg | ORAL_TABLET | Freq: Every morning | ORAL | Status: DC

## 2014-10-14 LAB — URINE CULTURE

## 2014-10-15 ENCOUNTER — Telehealth: Payer: Self-pay | Admitting: *Deleted

## 2014-10-15 NOTE — Telephone Encounter (Signed)
Reviewed urine culture results with patient. 

## 2014-10-18 ENCOUNTER — Telehealth: Payer: Self-pay | Admitting: Internal Medicine

## 2014-10-18 NOTE — Telephone Encounter (Signed)
She needs to call her Urologist if she thinks this is a stone. Otherwise must go toED

## 2014-10-18 NOTE — Telephone Encounter (Signed)
Tuesday night and last night was in pain - Left sided back pain and left lower abdominal pain as well.  Took last pain pill this a.m. @ 8:15.  States the hydrocodone wasn't working for her.  Her daughter had some Oxycodone, so she took some of that and it seemed to work better.  She believes that she is having a kidney stone. However, she has no blood in her urine but is having pain like she has had in the past.  She is at work because she's on a new job and cannot afford to miss work.    Wants to have an MRI ordered (because she has the type of stones that are not detected on CT scan).  Advised that you would MOST likely send her to the ED if she's in that much pain that she is having to take narcotic pain medication for the last 2 days.  States it would be this evening before she would be able to go because she wouldn't be able to miss work.  Advised I would speak with you and we would call her back.    Patient states she is not in stabbing sharp pain at the present time - reminded her that is because she has taken the narcotic pain med at 8:15 this a.m.  When it wears off, that may be a different story.    Please advise.

## 2014-10-18 NOTE — Telephone Encounter (Signed)
Patient advised to see her urologist or go to ED she states she will call Dr Cy Blamer office to seen if they can see her.

## 2014-11-20 ENCOUNTER — Other Ambulatory Visit: Payer: Self-pay | Admitting: Internal Medicine

## 2014-11-23 ENCOUNTER — Ambulatory Visit (INDEPENDENT_AMBULATORY_CARE_PROVIDER_SITE_OTHER): Payer: 59 | Admitting: Internal Medicine

## 2014-11-23 ENCOUNTER — Encounter: Payer: Self-pay | Admitting: Internal Medicine

## 2014-11-23 VITALS — BP 128/84 | HR 88 | Temp 99.1°F | Wt 271.0 lb

## 2014-11-23 DIAGNOSIS — L0292 Furuncle, unspecified: Secondary | ICD-10-CM | POA: Diagnosis not present

## 2014-11-23 MED ORDER — CEFTRIAXONE SODIUM 1 G IJ SOLR
1.0000 g | Freq: Once | INTRAMUSCULAR | Status: AC
  Administered 2014-11-23: 1 g via INTRAMUSCULAR

## 2014-11-23 MED ORDER — HYDROCODONE-ACETAMINOPHEN 5-325 MG PO TABS: ORAL_TABLET | ORAL | Status: DC

## 2014-11-23 MED ORDER — MUPIROCIN 2 % EX OINT: 1.0000 "application " | TOPICAL_OINTMENT | Freq: Two times a day (BID) | CUTANEOUS | Status: DC

## 2014-11-23 MED ORDER — DOXYCYCLINE HYCLATE 50 MG PO CAPS: 50.0000 mg | ORAL_CAPSULE | Freq: Two times a day (BID) | ORAL | Status: DC

## 2014-11-23 NOTE — Progress Notes (Signed)
   Subjective:    Patient ID: Mary Herman, female    DOB: 12-18-1960, 54 y.o.   MRN: [id number]  HPI  On September 21 patient had malaise and fatigue. She thought she might be coming down with an upper respiratory infection. On Friday, September 23 she went to an urgent care center and was given Rocephin and started on Zithromax. Was thought to have lymphadenitis. Chest x-ray was negative. Subsequently developed swelling and redness right axillary area and below right axilla. Has been in a lot of pain. Has been taking ibuprofen for pain. Has had other erythematous lesions form over the last week in  adjacent areas.    Review of Systems as above     Objective:   Physical Exam  Multiple areas of erythema right axilla and below right axilla consistent with hidradenitis suppurativa.      Assessment & Plan:  Hidradenitis suppurativa  Plan: Rocephin 1 g IM. Doxycycline 100 mg twice daily for 2 weeks. Refill hydrocodone/APAP 5/325 #90 one by mouth every 6 hours when necessary pain. Warm hot compresses to right axilla 20 minutes twice daily. Bactroban to nostrils once daily. Return in one week. Bathe in Hibiclens. Sample provided. Patient says she cannot use Dial soap and usually only uses Dove soap.

## 2014-11-23 NOTE — Patient Instructions (Signed)
Bathing and Hibiclens as directed. Use Bactroban in nostrils once a day. Take doxycycline 100 mg twice daily for 2 weeks. Use warm hot compresses to right eye exam will 1 sore twice daily. Rocephin IM given. Return in one week.

## 2014-11-30 ENCOUNTER — Encounter: Payer: Self-pay | Admitting: Internal Medicine

## 2014-11-30 ENCOUNTER — Ambulatory Visit (INDEPENDENT_AMBULATORY_CARE_PROVIDER_SITE_OTHER): Payer: 59 | Admitting: Internal Medicine

## 2014-11-30 VITALS — BP 120/74 | HR 84 | Temp 98.2°F | Ht 63.0 in | Wt 271.0 lb

## 2014-11-30 DIAGNOSIS — L732 Hidradenitis suppurativa: Secondary | ICD-10-CM

## 2014-12-06 ENCOUNTER — Telehealth: Payer: Self-pay | Admitting: Internal Medicine

## 2014-12-06 NOTE — Telephone Encounter (Signed)
Patient called to inquire on her PE 10/12/14.  States she gets a bonus on her flex card once she has a PE and it's billed that she has completed it.  She called billing office and they referred her back to our office.  Advised patient that the majority of the chart is completed while she is here in the office.  However, the remainder of the dictation has to be completed after the visit.  She is looking for her bonus before the end of the year.    FYI.  Thanks.

## 2014-12-22 NOTE — Patient Instructions (Signed)
Continue warm hot compresses and dressing right axilla until carbuncle has completely drained. Continue Dial or Hibiclens soap for 6 weeks

## 2014-12-22 NOTE — Progress Notes (Signed)
   Subjective:    Patient ID: Mary Herman, female    DOB: [birth year], 54 y.o.   MRN: [id number]  HPI  In today to follow-up on hidradenitis. Was here one  week ago. Was started on ten-day course of doxycycline and warm hot compresses. Says that carbuncle right axilla opened up and drained considerably. Feeling better with less tenderness.    Review of Systems     Objective:   Physical Exam  Slight drainage right armpit from carbuncle. Given bottles of peroxide to take home and clean right axilla twice daily and continue wrapping until healed      Assessment & Plan:  Hidradenitis suppurativa  Plan: Continue Hibiclens or Dial soap for 6 weeks minimum.

## 2015-01-06 ENCOUNTER — Other Ambulatory Visit: Payer: Self-pay | Admitting: Internal Medicine

## 2015-01-09 ENCOUNTER — Other Ambulatory Visit: Payer: Self-pay

## 2015-01-09 MED ORDER — BUPROPION HCL 75 MG PO TABS: 150.0000 mg | ORAL_TABLET | Freq: Two times a day (BID) | ORAL | Status: DC

## 2015-01-09 MED ORDER — LEVOCETIRIZINE DIHYDROCHLORIDE 5 MG PO TABS: 5.0000 mg | ORAL_TABLET | Freq: Every evening | ORAL | Status: DC

## 2015-01-09 MED ORDER — CELECOXIB 200 MG PO CAPS: 200.0000 mg | ORAL_CAPSULE | Freq: Two times a day (BID) | ORAL | Status: DC

## 2015-01-09 MED ORDER — CITALOPRAM HYDROBROMIDE 20 MG PO TABS: 20.0000 mg | ORAL_TABLET | Freq: Every morning | ORAL | Status: DC

## 2015-01-09 MED ORDER — MONTELUKAST SODIUM 10 MG PO TABS: 10.0000 mg | ORAL_TABLET | Freq: Every day | ORAL | Status: DC

## 2015-01-09 MED ORDER — LEVOTHYROXINE SODIUM 75 MCG PO TABS: 75.0000 ug | ORAL_TABLET | Freq: Every day | ORAL | Status: DC

## 2015-01-09 MED ORDER — CYCLOBENZAPRINE HCL 10 MG PO TABS: 10.0000 mg | ORAL_TABLET | Freq: Every day | ORAL | Status: DC

## 2015-01-09 MED ORDER — DIAZEPAM 5 MG PO TABS: 5.0000 mg | ORAL_TABLET | Freq: Every day | ORAL | Status: DC

## 2015-01-09 NOTE — Telephone Encounter (Signed)
Pt had CPE 09/2014 no future OV scheduled.

## 2015-01-22 DIAGNOSIS — F32A Depression, unspecified: Secondary | ICD-10-CM | POA: Insufficient documentation

## 2015-01-22 DIAGNOSIS — E039 Hypothyroidism, unspecified: Secondary | ICD-10-CM | POA: Insufficient documentation

## 2015-01-22 DIAGNOSIS — M545 Low back pain, unspecified: Secondary | ICD-10-CM | POA: Insufficient documentation

## 2015-01-22 DIAGNOSIS — F329 Major depressive disorder, single episode, unspecified: Secondary | ICD-10-CM | POA: Insufficient documentation

## 2015-01-22 DIAGNOSIS — J309 Allergic rhinitis, unspecified: Secondary | ICD-10-CM | POA: Insufficient documentation

## 2015-01-22 DIAGNOSIS — G8929 Other chronic pain: Secondary | ICD-10-CM | POA: Insufficient documentation

## 2015-01-22 NOTE — Progress Notes (Signed)
   Subjective:    Patient ID: Mary Herman, female    DOB: 1960/11/29, 54 y.o.   MRN: [id number]  HPI   54 year old female with history of hypothyroidism, gastric bypass surgery, depression, allergic rhinitis, vitamin D deficiency, history of chronic back pain in today for health maintenance exam and evaluation of medical problems.   She is allergic to cats and has cats in her home. She had gastric bypass surgery in 2009 by Dr. Hassell Done. History of interstitial cystitis in the remote past. History of TMJ syndrome. Had arthroscopic surgery on left knee some 25 years ago. Status post cholecystectomy. Had cesarean section 1998.   Codeine causes a rash. Had tetanus immunization and annual flu vaccine done through [facility]     Social history: She is married. Husband is disabled. Patient has a high school education. She does not smoke or consume I'll call. One daughter who is a teenager. Patient says daughters had some 9 operations on her femur. 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 Now working at [employer]. Considerable stress and financial issues over the past year. Has had to file bankruptcy.   Family history: Father died at age 84 with history of alcoholism because of death was not known to patient. Mother with history of brain aneurysm 10 years prior to her death. Mother died at age 4 with history of stroke and had lung disease. No sisters. One brother age 49 with history of hypertension. One brother age 48 whose health she does not know about   Review of Systems  Chronic back pain and depression.     Objective:   Physical Exam  Constitutional: She is oriented to person, place, and time. She appears well-developed and well-nourished. No distress.  HENT:  Head: Normocephalic and atraumatic.  Right Ear: External ear normal.  Left Ear: External ear normal.  Mouth/Throat: Oropharynx is clear and moist. No oropharyngeal exudate.  Eyes: Conjunctivae and  EOM are normal. Pupils are equal, round, and reactive to light. Right eye exhibits no discharge. Left eye exhibits no discharge.  Neck: Neck supple. No JVD present. No tracheal deviation present. No thyromegaly present.  Cardiovascular: Normal rate, regular rhythm, normal heart sounds and intact distal pulses.   Pulmonary/Chest: Effort normal and breath sounds normal. She has no wheezes. She has no rales.  Breasts normal female without masses  Abdominal: Soft. Bowel sounds are normal. She exhibits no distension and no mass. There is no tenderness. There is no rebound and no guarding.  Genitourinary:  Pap done 2014. Bimanual normal.  Musculoskeletal: She exhibits no edema.  Lymphadenopathy:    She has no cervical adenopathy.  Neurological: She is alert and oriented to person, place, and time. She has normal reflexes. No cranial nerve deficit. Coordination normal.  Skin: Skin is warm and dry. She is not diaphoretic.  Psychiatric: Her behavior is normal. Judgment and thought content normal.  Flat affect  Vitals reviewed.         Assessment & Plan:  Situational Stress Hypothyroidism Status post gastric bypass surgery Allergic Rhinitis Depression Chronic low back pain Hx Vitamin D deficiency History of kidney stones   Plan:Continue same meds and RTC 6 months for follow-up and TSH

## 2015-01-22 NOTE — Patient Instructions (Signed)
Watch diet. Return in 6 months for follow-up and TSH.

## 2015-04-22 ENCOUNTER — Other Ambulatory Visit: Payer: Self-pay

## 2015-04-23 MED ORDER — DIAZEPAM 5 MG PO TABS: 5.0000 mg | ORAL_TABLET | Freq: Every day | ORAL | Status: DC

## 2015-04-23 NOTE — Telephone Encounter (Signed)
Phoned to pharmacy 

## 2015-05-29 ENCOUNTER — Other Ambulatory Visit: Payer: Self-pay | Admitting: Internal Medicine

## 2015-05-30 ENCOUNTER — Other Ambulatory Visit: Payer: Self-pay

## 2015-05-30 MED ORDER — CELECOXIB 200 MG PO CAPS: 200.0000 mg | ORAL_CAPSULE | Freq: Two times a day (BID) | ORAL | Status: DC

## 2015-05-30 MED ORDER — CELECOXIB 200 MG PO CAPS
200.0000 mg | ORAL_CAPSULE | Freq: Two times a day (BID) | ORAL | Status: DC
Start: 2015-05-30 — End: 2015-11-29

## 2015-06-03 ENCOUNTER — Encounter: Payer: Self-pay | Admitting: Internal Medicine

## 2015-07-24 ENCOUNTER — Other Ambulatory Visit: Payer: Self-pay

## 2015-07-24 MED ORDER — CYCLOBENZAPRINE HCL 10 MG PO TABS: 10.0000 mg | ORAL_TABLET | Freq: Every day | ORAL | Status: DC

## 2015-07-24 MED ORDER — LEVOTHYROXINE SODIUM 75 MCG PO TABS: 75.0000 ug | ORAL_TABLET | Freq: Every day | ORAL | Status: DC

## 2015-07-24 MED ORDER — DIAZEPAM 5 MG PO TABS: 5.0000 mg | ORAL_TABLET | Freq: Every day | ORAL | Status: DC

## 2015-07-24 MED ORDER — CITALOPRAM HYDROBROMIDE 20 MG PO TABS: 20.0000 mg | ORAL_TABLET | Freq: Every morning | ORAL | Status: DC

## 2015-07-24 NOTE — Telephone Encounter (Signed)
Refill x 90 days due for CPE August. Last August 2016

## 2015-07-24 NOTE — Telephone Encounter (Signed)
Patient states that she has lost her insurance and that she has contacted several different pharmacies. The cheapest one she has found is [facility] in [place]. She is requesting #90 day supply's of citalopram, cyclobenzaprine, valium and levothyroxine. If we do these then it will only cost her around $50.00 for all of these. Please advise if this ok.

## 2015-09-24 ENCOUNTER — Telehealth: Payer: Self-pay | Admitting: Internal Medicine

## 2015-09-24 NOTE — Telephone Encounter (Signed)
Patient is calling; states she has been out of work since April.  Has no insurance.  Recently went back to work with [employer], still has no insurance.  She has to walk downtown from a parking garage to the office and has to do what she describes as "right much" walking during the day on the job at [employer].  She says her knees are really beginning to bother her.    She's calling asking for Hydrocodone.  Says that she cannot afford to come in to be seen because she doesn't have any insurance.  Advised patient that she would need to be seen because she hasn't had a CPE since 10/12/14; so she's due for a physical.  She states she can't afford to come in to see you.  She needs pain medication to help with her knees.  Advised she hadn't been seen in the office since 11/2014 for anything.    She says she can't afford to come in.  I told her as a self-pay patient her visit would be discounted 50%.  Told the patient that she would have to be seen in order to have any pain medication provided.  She hasn't been seen in 9 months.  In order for anxiety or pain medications (any controlled medications) to be prescribed your rule is that patient must be seen in the office every 6 months.  Patient said that she would think about it and call us back.  Again, told her that she would be given a 50% discount by being self-pay.  She said she would call back if she could work something out.    FYI.

## 2015-11-25 ENCOUNTER — Ambulatory Visit: Payer: 59 | Admitting: Internal Medicine

## 2015-11-29 ENCOUNTER — Encounter: Payer: Self-pay | Admitting: Internal Medicine

## 2015-11-29 ENCOUNTER — Ambulatory Visit (INDEPENDENT_AMBULATORY_CARE_PROVIDER_SITE_OTHER): Payer: Self-pay | Admitting: Internal Medicine

## 2015-11-29 VITALS — BP 140/92 | HR 120 | Temp 98.1°F | Wt 284.0 lb

## 2015-11-29 DIAGNOSIS — F439 Reaction to severe stress, unspecified: Secondary | ICD-10-CM

## 2015-11-29 DIAGNOSIS — E039 Hypothyroidism, unspecified: Secondary | ICD-10-CM

## 2015-11-29 DIAGNOSIS — J309 Allergic rhinitis, unspecified: Secondary | ICD-10-CM

## 2015-11-29 DIAGNOSIS — M5440 Lumbago with sciatica, unspecified side: Secondary | ICD-10-CM

## 2015-11-29 DIAGNOSIS — G8929 Other chronic pain: Secondary | ICD-10-CM

## 2015-11-29 DIAGNOSIS — F329 Major depressive disorder, single episode, unspecified: Secondary | ICD-10-CM

## 2015-11-29 DIAGNOSIS — F32A Depression, unspecified: Secondary | ICD-10-CM

## 2015-11-29 LAB — TSH: TSH: 1.45 m[IU]/L

## 2015-11-29 MED ORDER — LEVOTHYROXINE SODIUM 75 MCG PO TABS: 75.0000 ug | ORAL_TABLET | Freq: Every day | ORAL | 1 refills | Status: DC

## 2015-11-29 MED ORDER — BUPROPION HCL 75 MG PO TABS: ORAL_TABLET | ORAL | 0 refills | Status: DC

## 2015-11-29 MED ORDER — CITALOPRAM HYDROBROMIDE 20 MG PO TABS: 20.0000 mg | ORAL_TABLET | Freq: Every morning | ORAL | 1 refills | Status: DC

## 2015-11-29 MED ORDER — CYCLOBENZAPRINE HCL 10 MG PO TABS: 10.0000 mg | ORAL_TABLET | Freq: Every day | ORAL | 1 refills | Status: DC

## 2015-11-29 MED ORDER — MONTELUKAST SODIUM 10 MG PO TABS: 10.0000 mg | ORAL_TABLET | Freq: Every day | ORAL | 0 refills | Status: DC

## 2015-11-29 MED ORDER — DIAZEPAM 5 MG PO TABS: 5.0000 mg | ORAL_TABLET | Freq: Every day | ORAL | 0 refills | Status: DC

## 2015-11-29 MED ORDER — CELECOXIB 200 MG PO CAPS: 200.0000 mg | ORAL_CAPSULE | Freq: Two times a day (BID) | ORAL | 1 refills | Status: DC

## 2015-11-29 MED FILL — diazePAM 5 MG TABS: 5 | 90 days supply | Qty: 90 | Fill #0

## 2015-11-29 MED FILL — CELECOXIB 200 MG CAPSULE: 200 | 60 days supply | Qty: 120 | Fill #0

## 2015-11-29 MED FILL — MONTELUKAST SOD 10 MG TAB: 10 | 90 days supply | Qty: 90 | Fill #0

## 2015-11-29 MED FILL — CYCLOBENZAPRINE 10 MG TAB: 10 | 90 days supply | Qty: 90 | Fill #0

## 2015-11-29 NOTE — Progress Notes (Signed)
   Subjective:    Patient ID: Mary Herman, female    DOB: Mar 18, 1960, 55 y.o.   MRN: [id number]  HPI 55 year old Female not seen here since October 2016.  She lost her job with [employer]. [employer]. Subsequently went to [employer] but that job did not work out either. She is currently working at a [employer]. She has financial stress and no health insurance at present time.  She needs her medications refilled.  She has a history of hypothyroidism and has not had TSH drawn since January 2016. This was done today and is within normal limits.   Review of Systems See above-looks fatigued     Objective:   Physical Exam Neck is supple without thyromegaly. Chest clear. Cardiac exam regular rate and rhythm normal S1 and S2. Affect is flat and depressed.       Assessment & Plan:  Hypothyroidism  Allergic rhinitis  Depression  Chronic musculoskeletal pain  Plan: Have refilled thyroid replacement medication, Singulair, Valium, Celexa, Wellbutrin and Celebrex. Would like for patient to be seen every 6 months if possible.

## 2015-12-02 NOTE — Progress Notes (Signed)
Pt informed

## 2015-12-09 ENCOUNTER — Encounter: Payer: Self-pay | Admitting: Internal Medicine

## 2015-12-09 ENCOUNTER — Ambulatory Visit (INDEPENDENT_AMBULATORY_CARE_PROVIDER_SITE_OTHER): Payer: Self-pay | Admitting: Internal Medicine

## 2015-12-09 VITALS — BP 128/86 | HR 119 | Temp 99.3°F | Wt 284.0 lb

## 2015-12-09 DIAGNOSIS — H6503 Acute serous otitis media, bilateral: Secondary | ICD-10-CM

## 2015-12-09 DIAGNOSIS — J029 Acute pharyngitis, unspecified: Secondary | ICD-10-CM

## 2015-12-09 DIAGNOSIS — J069 Acute upper respiratory infection, unspecified: Secondary | ICD-10-CM

## 2015-12-09 MED ORDER — LEVOFLOXACIN 500 MG PO TABS: 500.0000 mg | ORAL_TABLET | Freq: Every day | ORAL | 0 refills | Status: DC

## 2015-12-09 MED ORDER — HYDROCODONE-HOMATROPINE 5-1.5 MG/5ML PO SYRP: 5.0000 mL | ORAL_SOLUTION | Freq: Three times a day (TID) | ORAL | 0 refills | Status: DC | PRN

## 2015-12-09 MED FILL — HYDROCODONE-HOMATROPINE SYR: 5-1.5 | 8 days supply | Qty: 120 | Fill #0

## 2015-12-09 MED FILL — levoFLOXacin 500 MG TABS: 500 | 10 days supply | Qty: 10 | Fill #0

## 2015-12-09 NOTE — Progress Notes (Signed)
   Subjective:    Patient ID: Mary Herman, female    DOB: June 14, 1960, 55 y.o.   MRN: [id number]  HPI  Onset Saturday, October 14 of URI symptoms. Says daughter came home with respiratory infection and gave it to her. Has had cough and congestion. Felt like she had a fever but did not take temperature. Has been hoarse. Says throat is sore in the ears hurt. Daughter went to urgent care and was told she did not have strep throat. Cough with some productive sputum.    Review of Systems see above     Objective:   Physical Exam Both TMs are full bilaterally but not red. Pharynx is red without exudate. Neck is supple. She is hoarse. Chest clear to auscultation without rales or wheezing. Rapid strep was not done       Assessment & Plan:  Acute URI  Bilateral serous otitis media  Acute pharyngitis  Plan: Levaquin 500 milligrams daily for 10 days. Hycodan 1 teaspoon by mouth every 8 hours when necessary cough. Rest and drink plenty of fluids.

## 2015-12-09 NOTE — Patient Instructions (Signed)
Hycodan 1 teaspoon by mouth every 8 hours when necessary cough. Levaquin 500 milligrams daily for 10 days. Rest and drink plenty of fluids.

## 2015-12-22 NOTE — Patient Instructions (Signed)
Medications refilled as described in office note. Return in 6 months. TSH is normal.

## 2016-01-02 ENCOUNTER — Encounter (HOSPITAL_COMMUNITY): Payer: Self-pay

## 2016-02-21 ENCOUNTER — Ambulatory Visit (INDEPENDENT_AMBULATORY_CARE_PROVIDER_SITE_OTHER): Payer: Self-pay | Admitting: Family

## 2016-02-24 MED FILL — CELECOXIB 200 MG CAPSULE: 200 | 60 days supply | Qty: 120 | Fill #1

## 2016-02-25 ENCOUNTER — Other Ambulatory Visit: Payer: Self-pay | Admitting: Internal Medicine

## 2016-02-25 MED FILL — diazePAM 5 MG TABS: 5 | 30 days supply | Qty: 30 | Fill #0

## 2016-02-25 MED FILL — CYCLOBENZAPRINE 10 MG TAB: 10 | 90 days supply | Qty: 90 | Fill #1

## 2016-02-25 NOTE — Telephone Encounter (Signed)
Refill once. Must make appt March to keep getting refills on this RX. We require q 6 month visits on benzodiazepine Refills.

## 2016-02-26 ENCOUNTER — Encounter: Payer: Self-pay | Admitting: Internal Medicine

## 2016-02-26 ENCOUNTER — Telehealth: Payer: Self-pay | Admitting: Internal Medicine

## 2016-02-26 MED ORDER — CYCLOBENZAPRINE HCL 10 MG PO TABS: 10.0000 mg | ORAL_TABLET | Freq: Every day | ORAL | 0 refills | Status: DC

## 2016-02-26 MED FILL — CITALOPRAM HBR 20 MG TABLET: 20 | 90 days supply | Qty: 90 | Fill #0

## 2016-02-26 MED FILL — LEVOTHYROXINE 75 MCG TABLET: 75 | 90 days supply | Qty: 90 | Fill #0

## 2016-02-26 MED FILL — buPROPion HCL 75 MG TABS: 75 | 45 days supply | Qty: 180 | Fill #0

## 2016-02-26 NOTE — Telephone Encounter (Signed)
Receive fax request from [facility] outpatient pharmacy regarding generic Flexeril 10 mg 1 by mouth daily at bedtime. E prescribed #90 with no refill

## 2016-03-02 ENCOUNTER — Other Ambulatory Visit (INDEPENDENT_AMBULATORY_CARE_PROVIDER_SITE_OTHER): Payer: Self-pay

## 2016-03-02 ENCOUNTER — Telehealth (INDEPENDENT_AMBULATORY_CARE_PROVIDER_SITE_OTHER): Payer: Self-pay | Admitting: Radiology

## 2016-03-02 ENCOUNTER — Other Ambulatory Visit (INDEPENDENT_AMBULATORY_CARE_PROVIDER_SITE_OTHER): Payer: Self-pay | Admitting: Orthopaedic Surgery

## 2016-03-02 MED ORDER — METHYLPREDNISOLONE 4 MG PO TBPK: ORAL_TABLET | ORAL | 0 refills | Status: DC

## 2016-03-02 NOTE — Telephone Encounter (Signed)
Called into pharmacy, patient aware 

## 2016-03-02 NOTE — Telephone Encounter (Signed)
Patient called and LM triage saying that her knees are hurting badly.  She knows she needs TKA's but cannot do them now, she has no insurance and cannot afford a visit to see Dr Ninfa Linden right now.  Can Dr Ninfa Linden call in a Rx steroid to the pharmacy for her as he has in the past?  Her ph [phone]

## 2016-03-02 NOTE — Telephone Encounter (Signed)
OK to call in a medrol dose pack to her pharmacy. Thanks

## 2016-03-02 NOTE — Telephone Encounter (Signed)
Please advise 

## 2016-03-02 NOTE — Progress Notes (Unsigned)
medrol 

## 2016-03-26 ENCOUNTER — Other Ambulatory Visit: Payer: Self-pay | Admitting: Internal Medicine

## 2016-03-26 MED FILL — diazePAM 5 MG TABS: 5 | 30 days supply | Qty: 30 | Fill #0

## 2016-03-26 NOTE — Telephone Encounter (Signed)
Refill once 

## 2016-03-30 ENCOUNTER — Telehealth (INDEPENDENT_AMBULATORY_CARE_PROVIDER_SITE_OTHER): Payer: Self-pay | Admitting: Radiology

## 2016-03-30 ENCOUNTER — Telehealth (INDEPENDENT_AMBULATORY_CARE_PROVIDER_SITE_OTHER): Payer: Self-pay | Admitting: Orthopaedic Surgery

## 2016-03-30 ENCOUNTER — Other Ambulatory Visit (INDEPENDENT_AMBULATORY_CARE_PROVIDER_SITE_OTHER): Payer: Self-pay | Admitting: Radiology

## 2016-03-30 NOTE — Telephone Encounter (Signed)
OK TO REFILL. THANKS

## 2016-03-30 NOTE — Telephone Encounter (Signed)
Pt in pain requesting tramamdol, she said she's been prescribed this before  To outpt pharmacy at [facility] long   [phone]

## 2016-03-30 NOTE — Telephone Encounter (Signed)
No script in chart. Waiting for directions on how to take and will call in.

## 2016-03-30 NOTE — Telephone Encounter (Signed)
Please advise 

## 2016-03-30 NOTE — Telephone Encounter (Signed)
Please see previous note. I could not attach to it. Dr. Ninfa Linden said ok to refill Tramadol as patient states she has had it before. It is not listed in chart. I was unsure what directions to call in. She wants it called to [facility].

## 2016-03-31 MED FILL — traMADol HCL 50 MG TABS: 50 | 10 days supply | Qty: 60 | Fill #0

## 2016-03-31 NOTE — Telephone Encounter (Signed)
Called into pharmacy

## 2016-04-07 ENCOUNTER — Ambulatory Visit (INDEPENDENT_AMBULATORY_CARE_PROVIDER_SITE_OTHER): Payer: Self-pay | Admitting: Physician Assistant

## 2016-04-07 DIAGNOSIS — M1710 Unilateral primary osteoarthritis, unspecified knee: Secondary | ICD-10-CM | POA: Insufficient documentation

## 2016-04-07 DIAGNOSIS — M179 Osteoarthritis of knee, unspecified: Secondary | ICD-10-CM | POA: Insufficient documentation

## 2016-04-07 DIAGNOSIS — M170 Bilateral primary osteoarthritis of knee: Secondary | ICD-10-CM

## 2016-04-07 MED ORDER — HYDROCODONE-ACETAMINOPHEN 5-325 MG PO TABS: ORAL_TABLET | ORAL | 0 refills | Status: DC

## 2016-04-07 MED FILL — HYDROCODON-APAP 5-325: 5-325 | 5 days supply | Qty: 30 | Fill #0

## 2016-04-07 NOTE — Progress Notes (Signed)
Office Visit Note   Patient: Mary Herman           Date of Birth: [birth year]           MRN: [id number] Visit Date: 04/07/2016              Requested by: Elby Showers, MD [address] [place], [place] [id number] PCP: Elby Showers, MD   Assessment & Plan: Visit Diagnoses:  1. Primary osteoarthritis of both knees     Plan: Her about taking NSAIDs and the fact that she should not take Celebrex and taking over-the-counter NSAIDs. Give her some Norco tell with her severe pain she see use these sparingly. Her back in 3 months for cortisone injections in both or either knee. Her wrist or hands did give her some Pennsaid to try to create 3 times daily over the areas of maximal tenderness. She is using paraffin and it think this is excellent to continue. He would like a rheumatological workup in the future related to the present time does not have any means her insurance to cover this.  Follow-Up Instructions: Return if symptoms worsen or fail to improve.   Orders:  Orders Placed This Encounter  Procedures  . Large Joint Injection/Arthrocentesis  . Large Joint Injection/Arthrocentesis   Meds ordered this encounter  Medications  . HYDROcodone-acetaminophen (NORCO/VICODIN) 5-325 MG tablet    Sig: One po q 4 hours prn pain    Dispense:  30 tablet    Refill:  0      Procedures: Large Joint Inj Date/Time: 04/07/2016 9:55 AM Performed by: Pete Pelt Authorized by: Pete Pelt   Consent Given by:  Patient Indications:  Pain Location:  Knee Site:  L knee Needle Size:  22 G Approach:  Superolateral Ultrasound Guidance: No   Fluoroscopic Guidance: No   Medications:  40 mg methylPREDNISolone acetate 40 MG/ML; 5 mL lidocaine 1 % Aspiration Attempted: Yes   Aspirate amount (mL):  5 Aspirate:  Yellow Patient tolerance:  Patient tolerated the procedure well with no immediate complications Large Joint Inj Date/Time: 04/07/2016 9:57 AM Performed by: Pete Pelt Authorized by: Pete Pelt   Consent Given by:  Patient Indications:  Pain Location:  Knee Site:  R knee Needle Size:  22 G Approach:  Anterolateral Ultrasound Guidance: No   Fluoroscopic Guidance: No   Medications:  40 mg methylPREDNISolone acetate 40 MG/ML; 3 mL lidocaine 1 % Aspiration Attempted: No   Patient tolerance:  Patient tolerated the procedure well with no immediate complications     Clinical Data: No additional findings.   Subjective: No chief complaint on file.   HPI He is well-known Dr. Trevor Mace service has known osteoarthritis bilateral knees. Comes in today requesting cortisone injections both knees. As having an injection in the right knee in May 2017. Also complaining of periodic hand pain both hands. She is not having any triggering of the hands. She's been taking Celebrex 200 mg twice daily and then taking over-the-counter Aleve or ibuprofen also with his due to mostly pain in her knees.. A history of gastric bypass Review of Systems   Objective: Vital Signs: There were no vitals taken for this visit.  Physical Exam  Constitutional: She is oriented to person, place, and time. She appears well-developed and well-nourished. No distress.  Neurological: She is alert and oriented to person, place, and time.  Psychiatric: She has a normal mood and affect. Her behavior is normal.  Ortho Exam Knee slight effusion. She has tenderness along the medial lateral joint line of both knees. No instability valgus varus stressing. She lacks last few degrees in full extension the left knee right knee overall good range of motion. Varus Deformities of both knees. Specialty Comments:  No specialty comments available.  Imaging: No results found.   PMFS History: Patient Active Problem List   Diagnosis Date Noted  . OA (osteoarthritis) of knee 04/07/2016  . Hypothyroidism 01/22/2015  . Chronic low back pain 01/22/2015  . Depression 01/22/2015  .  Allergic rhinitis 01/22/2015  . Ureteral calculus 12/26/2012  . Personal history of gastric bypass 11/12/2012  . Hyperlipidemia    Past Medical History:  Diagnosis Date  . Anxiety   . Chronic back pain   . Environmental allergies   . Hematuria   . History of cystitis    INTERSTITIAL CYSTITIS--  NO ISSUES FOR YRS  . History of gastroesophageal reflux (GERD)    DENIES ISSUES SINCE GASTRIC BY PASS  . History of hypertension    NO ISSUE SINCE WT LOSS AFTER GASTRIC BYPASS  . History of obstructive sleep apnea    USED CPAP--  NO ISSUE SINCE WT LOSS AFTER GASTRIC BYPASS  . Hypothyroid   . Renal calculus, left    NON-OBSTRUCTIVE  . Right ureteral stone   . Trochanteric bursitis of both hips     Family History  Problem Relation Age of Onset  . Hypertension Mother   . Stroke Mother   . Hypertension Brother   . Heart disease Maternal Uncle   . Diabetes Maternal Grandmother   . Cancer Maternal Grandmother     THROAT CA  . Cancer Maternal Grandfather     THROAT CA    Past Surgical History:  Procedure Laterality Date  . CARDIAC CATHETERIZATION  12-07-2006  DR [facility]   NORMAL CORONARIES ARTERIES/ NORMAL LVF/ MILD INCREASED END-DIASTOLIC PRESSURE  . CESAREAN SECTION  1998  . CHOLECYSTECTOMY  1980'S  . CYSTOSCOPY WITH RETROGRADE PYELOGRAM, URETEROSCOPY AND STENT PLACEMENT Bilateral 12/26/2012   Procedure: cystoscopy with bilateral retrogrades, bilateral ureteroscopy ;  Surgeon: Bernestine Amass, MD;  Location[facility];  Service: Urology;  Laterality: Bilateral;  . CYSTOSCOPY WITH STENT PLACEMENT Bilateral 12/26/2012   Procedure: CYSTOSCOPY WITH STENT PLACEMENT;  Surgeon: Bernestine Amass, MD;  [facility];  Service: Urology;  Laterality: Bilateral;  . CYSTOSCOPY/ HYDRODISTENTION  YRS AGO  . HOLMIUM LASER APPLICATION Right 0000000   Procedure: HOLMIUM LASER APPLICATION;  Surgeon: Bernestine Amass, MD;  Location[facility];   Service: Urology;  Laterality: Right;  . KNEE ARTHROSCOPY Left 1992  . ROUX-EN-Y PROCEDURE  07-25-2007  . SHOULDER ARTHROSCOPY Right 2011  . TMJ ARTHROPLASTY  1990'S   BILATERAL UPPER   Social History   Occupational History  . Not on file.   Social History Main Topics  . Smoking status: Passive Smoke Exposure - Never Smoker    Years: 25.00    Types: Cigarettes  . Smokeless tobacco: Never Used  . Alcohol use No  . Drug use: No  . Sexual activity: No     Comment: husband vasectomy

## 2016-04-09 MED ORDER — LIDOCAINE HCL 1 % IJ SOLN
5.0000 mL | INTRAMUSCULAR | Status: AC | PRN
  Administered 2016-04-07: 5 mL

## 2016-04-09 MED ORDER — LIDOCAINE HCL 1 % IJ SOLN
3.0000 mL | INTRAMUSCULAR | Status: AC | PRN
  Administered 2016-04-07: 3 mL

## 2016-04-09 MED ORDER — METHYLPREDNISOLONE ACETATE 40 MG/ML IJ SUSP
40.0000 mg | INTRAMUSCULAR | Status: AC | PRN
  Administered 2016-04-07: 40 mg via INTRA_ARTICULAR

## 2016-04-27 ENCOUNTER — Other Ambulatory Visit: Payer: Self-pay | Admitting: Internal Medicine

## 2016-04-27 NOTE — Telephone Encounter (Signed)
Refill once 

## 2016-05-07 MED FILL — diazePAM 5 MG TABS: 5 | 30 days supply | Qty: 30 | Fill #0

## 2016-05-21 ENCOUNTER — Other Ambulatory Visit: Payer: Self-pay | Admitting: Internal Medicine

## 2016-05-21 MED FILL — CITALOPRAM HBR 20 MG TABLET: 20 | 90 days supply | Qty: 90 | Fill #1

## 2016-05-21 MED FILL — CYCLOBENZAPRINE 10 MG TAB: 10 | 90 days supply | Qty: 90 | Fill #0

## 2016-05-21 MED FILL — CELECOXIB 200 MG CAP: 200 | 60 days supply | Qty: 120 | Fill #2

## 2016-05-21 MED FILL — LEVOTHYROXINE 75 MCG TABLET: 75 | 90 days supply | Qty: 90 | Fill #1

## 2016-05-21 MED FILL — buPROPion HCL 75 MG TABS: 75 | 45 days supply | Qty: 180 | Fill #0

## 2016-05-29 ENCOUNTER — Encounter: Payer: Self-pay | Admitting: Internal Medicine

## 2016-05-29 ENCOUNTER — Ambulatory Visit (INDEPENDENT_AMBULATORY_CARE_PROVIDER_SITE_OTHER): Payer: Self-pay | Admitting: Internal Medicine

## 2016-05-29 VITALS — BP 170/104 | HR 97 | Temp 98.3°F | Wt 299.0 lb

## 2016-05-29 DIAGNOSIS — M2550 Pain in unspecified joint: Secondary | ICD-10-CM

## 2016-05-29 DIAGNOSIS — E039 Hypothyroidism, unspecified: Secondary | ICD-10-CM

## 2016-05-29 DIAGNOSIS — M19049 Primary osteoarthritis, unspecified hand: Secondary | ICD-10-CM

## 2016-05-29 DIAGNOSIS — M170 Bilateral primary osteoarthritis of knee: Secondary | ICD-10-CM

## 2016-05-29 DIAGNOSIS — Z8739 Personal history of other diseases of the musculoskeletal system and connective tissue: Secondary | ICD-10-CM

## 2016-05-29 MED ORDER — HYDROCODONE-ACETAMINOPHEN 10-325 MG PO TABS: 1.0000 | ORAL_TABLET | ORAL | 0 refills | Status: DC | PRN

## 2016-05-29 MED ORDER — METHYLPREDNISOLONE 4 MG PO TBPK: ORAL_TABLET | ORAL | 0 refills | Status: DC

## 2016-05-29 MED ORDER — TRAMADOL HCL 50 MG PO TABS: 50.0000 mg | ORAL_TABLET | Freq: Three times a day (TID) | ORAL | 0 refills | Status: DC | PRN

## 2016-05-29 MED FILL — traMADol HCL 50 MG TABS: 50 | 30 days supply | Qty: 90 | Fill #0

## 2016-05-29 MED FILL — METHYLPREDNISOLONE 4 MG TAB: 4 | 6 days supply | Qty: 21 | Fill #0

## 2016-05-29 MED FILL — HYDROCODON-APAP 10-325: 10-325 | 5 days supply | Qty: 30 | Fill #0

## 2016-05-30 LAB — SEDIMENTATION RATE: Sed Rate: 15 mm/hr (ref 0–30)

## 2016-06-01 LAB — CYCLIC CITRUL PEPTIDE ANTIBODY, IGG

## 2016-06-01 LAB — RHEUMATOID FACTOR: RHEUMATOID FACTOR: 123 [IU]/mL — AB (ref <14)

## 2016-06-03 NOTE — Progress Notes (Signed)
   Subjective:    Patient ID: Mary Herman, female    DOB: 1960-03-12, 56 y.o.   MRN: [id number]  HPI 56 year old White Female currently working in a clerical position says she's been told in the past that she had a positive rheumatoid factor. We have never checked for that. She has a history of hypothyroidism. History of vitamin D deficiency in 2014. Has been having considerable issues with bilateral hand pain. Hands are stiff and sore. Joints are tender.  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 She likes the job and would like to get a full-time employment there.  History of osteoarthritis of both knees seen at [facility]. Both knees injected with methylprednisolone February 2018 at that office.  Has been taking Celebrex 200 mg twice daily. However more recently has been taking a lot of Tylenol sometimes four extra strength tablets 4 times a day which is way too much. We had a discussion about this today. She's had a small quantity of hydrocodone/APAP provided to her in February by orthopedist.    Review of Systems see above     Objective:   Physical Exam She has prominent Heberden's and Bouchard's nodes. These are tender. Joints are not red or hot to touch. Knees are not red or hot to touch.       Assessment & Plan:  Osteoarthritis versus rheumatoid arthritis  Plan: TSH checked in October was normal. Sedimentation rate, rheumatoid factor, CCP drawn. Tramadol 50 mg 1 by mouth every 8 hours when necessary pain. Sterapred DS 10 mg 6 day dosepak ordered to take in tapering course. Hydrocodone/APAP 10/325 one by mouth every 4 hours when necessary severe pain not relieved with tramadol. Take sparingly.  Have reviewed [place] controlled substances database. See query on file. No evidence of abuse.   Valium 5 mg #30 one by mouth daily at bedtime with 1 refill. Flexeril refilled for 6 months. May need refill on Celebrex in the very near future.  Rheumatology consultation.  Addendum: She has a positive CCP and a positive rheumatoid factor with normal sedimentation rate.

## 2016-06-04 NOTE — Patient Instructions (Signed)
Take tramadol every 8 hours when necessary hand pain. For pain not relieved with tramadol take Norco 10/325 very sparingly. Flexeril refilled. Valium 5 mg at bedtime for history of chronic low back pain. Rheumatology consultation due to positive CCP.

## 2016-06-08 MED FILL — diazePAM 5 MG TABS: 5 | 30 days supply | Qty: 30 | Fill #1

## 2016-07-01 ENCOUNTER — Encounter (INDEPENDENT_AMBULATORY_CARE_PROVIDER_SITE_OTHER): Payer: Self-pay | Admitting: Orthopaedic Surgery

## 2016-07-01 ENCOUNTER — Ambulatory Visit (INDEPENDENT_AMBULATORY_CARE_PROVIDER_SITE_OTHER): Payer: Self-pay | Admitting: Orthopaedic Surgery

## 2016-07-01 VITALS — Ht 63.0 in | Wt 299.0 lb

## 2016-07-01 DIAGNOSIS — M170 Bilateral primary osteoarthritis of knee: Secondary | ICD-10-CM

## 2016-07-01 DIAGNOSIS — M1712 Unilateral primary osteoarthritis, left knee: Secondary | ICD-10-CM

## 2016-07-01 DIAGNOSIS — M1711 Unilateral primary osteoarthritis, right knee: Secondary | ICD-10-CM

## 2016-07-01 MED ORDER — METHYLPREDNISOLONE ACETATE 40 MG/ML IJ SUSP
40.0000 mg | INTRAMUSCULAR | Status: AC | PRN
  Administered 2016-07-01: 40 mg via INTRA_ARTICULAR

## 2016-07-01 MED ORDER — LIDOCAINE HCL 1 % IJ SOLN
3.0000 mL | INTRAMUSCULAR | Status: AC | PRN
  Administered 2016-07-01: 3 mL

## 2016-07-01 MED ORDER — METHYLPREDNISOLONE ACETATE 40 MG/ML IJ SUSP
40.0000 mg | INTRAMUSCULAR | Status: AC | PRN
Start: 2016-07-01 — End: 2016-07-01
  Administered 2016-07-01: 40 mg via INTRA_ARTICULAR

## 2016-07-01 NOTE — Progress Notes (Signed)
Office Visit Note   Patient: Mary Herman           Date of Birth: 1960/12/02           MRN: [id number] Visit Date: 07/01/2016              Requested by: Elby Showers, MD [address] [place], [place] [id number] PCP: Elby Showers, MD   Assessment & Plan: Visit Diagnoses:  1. Primary osteoarthritis of both knees     Plan: She tolerated the steroid injections well both knees. We will see if one of the companies out there has been assistance program for uninsured people for providing hyaluronic acid in this were able to obtain this we will call her knees.  Follow-Up Instructions: No Follow-up on file.   Orders:  Orders Placed This Encounter  Procedures  . Large Joint Injection/Arthrocentesis  . Large Joint Injection/Arthrocentesis   No orders of the defined types were placed in this encounter.     Procedures: Large Joint Inj Date/Time: 07/01/2016 8:35 AM Performed by: Mcarthur Rossetti Authorized by: Jean Rosenthal Y   Location:  Knee Site:  R knee Ultrasound Guidance: No   Fluoroscopic Guidance: No   Arthrogram: No   Medications:  3 mL lidocaine 1 %; 40 mg methylPREDNISolone acetate 40 MG/ML Large Joint Inj Date/Time: 07/01/2016 8:35 AM Performed by: Mcarthur Rossetti Authorized by: Mcarthur Rossetti   Location:  Knee Site:  L knee Ultrasound Guidance: No   Fluoroscopic Guidance: No   Arthrogram: No   Medications:  3 mL lidocaine 1 %; 40 mg methylPREDNISolone acetate 40 MG/ML     Clinical Data: No additional findings.   Subjective: Chief Complaint  Patient presents with  . Right Knee - Pain    Bilateral knee pain, with history of OA bilateral. Wanting to see possible hyaluronic acid injection. Prior steroid injection did provide relief.   . Left Knee - Pain    Bilateral knee pain, with history of OA bilateral. Wanting to see possible hyaluronic acid injection. Prior steroid injection did provide relief.      HPI The patient has known chronic pain in both her knees and osteophytes of her knees. She comes in from time to time for steroid injections in the knees. 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 She's had hyaluronic acid injections in the past when she was ensured. She's inquiring about any type of assistance program for those at this point. She would like to have steroid injections in her knees today. Her Pain is consistent chronic and more so with activities area and she's had no other change in her medical status recently. Review of Systems She currently denies any headache, chest pain, short of breath, fever, chills, nausea, vomiting.  Objective: Vital Signs: Ht 5\' 3"  (1.6 m)   Wt 299 lb (135.6 kg)   BMI 52.97 kg/m   Physical Exam She is alert or 3 and in no acute distress Ortho Exam Examination of both knees show patellofemoral crepitation and pain throughout her range of motion. She has medial lateral joint line tenderness and none varus malalignment. Specialty Comments:  No specialty comments available.  Imaging: No results found.   PMFS History: Patient Active Problem List   Diagnosis Date Noted  . OA (osteoarthritis) of knee 04/07/2016  . Hypothyroidism 01/22/2015  . Chronic low back pain 01/22/2015  . Depression 01/22/2015  . Allergic rhinitis 01/22/2015  . Ureteral calculus 12/26/2012  .  Personal history of gastric bypass 11/12/2012  . Hyperlipidemia    Past Medical History:  Diagnosis Date  . Anxiety   . Chronic back pain   . Environmental allergies   . Hematuria   . History of cystitis    INTERSTITIAL CYSTITIS--  NO ISSUES FOR YRS  . History of gastroesophageal reflux (GERD)    DENIES ISSUES SINCE GASTRIC BY PASS  . History of hypertension    NO ISSUE SINCE WT LOSS AFTER GASTRIC BYPASS  . History of obstructive sleep apnea    USED CPAP--  NO ISSUE SINCE WT LOSS AFTER GASTRIC BYPASS  . Hypothyroid   . Renal calculus, left     NON-OBSTRUCTIVE  . Right ureteral stone   . Trochanteric bursitis of both hips     Family History  Problem Relation Age of Onset  . Hypertension Mother   . Stroke Mother   . Hypertension Brother   . Heart disease Maternal Uncle   . Diabetes Maternal Grandmother   . Cancer Maternal Grandmother     THROAT CA  . Cancer Maternal Grandfather     THROAT CA    Past Surgical History:  Procedure Laterality Date  . CARDIAC CATHETERIZATION  12-07-2006  DR [facility]   NORMAL CORONARIES ARTERIES/ NORMAL LVF/ MILD INCREASED END-DIASTOLIC PRESSURE  . CESAREAN SECTION  1998  . CHOLECYSTECTOMY  1980'S  . CYSTOSCOPY WITH RETROGRADE PYELOGRAM, URETEROSCOPY AND STENT PLACEMENT Bilateral 12/26/2012   Procedure: cystoscopy with bilateral retrogrades, bilateral ureteroscopy ;  Surgeon: Bernestine Amass, MD;  Location: [facility];  Service: Urology;  Laterality: Bilateral;  . CYSTOSCOPY WITH STENT PLACEMENT Bilateral 12/26/2012   Procedure: CYSTOSCOPY WITH STENT PLACEMENT;  Surgeon: Bernestine Amass, MD;  Location: [facility];  Service: Urology;  Laterality: Bilateral;  . CYSTOSCOPY/ HYDRODISTENTION  YRS AGO  . HOLMIUM LASER APPLICATION Right 74/09/2705   Procedure: HOLMIUM LASER APPLICATION;  Surgeon: Bernestine Amass, MD;  Location: [facility];  Service: Urology;  Laterality: Right;  . KNEE ARTHROSCOPY Left 1992  . ROUX-EN-Y PROCEDURE  07-25-2007  . SHOULDER ARTHROSCOPY Right 2011  . TMJ ARTHROPLASTY  1990'S   BILATERAL UPPER   Social History   Occupational History  . Not on file.   Social History Main Topics  . Smoking status: Passive Smoke Exposure - Never Smoker    Years: 25.00    Types: Cigarettes  . Smokeless tobacco: Never Used  . Alcohol use No  . Drug use: No  . Sexual activity: No     Comment: husband vasectomy

## 2016-07-06 ENCOUNTER — Ambulatory Visit (INDEPENDENT_AMBULATORY_CARE_PROVIDER_SITE_OTHER): Payer: Self-pay | Admitting: Physician Assistant

## 2016-07-07 MED FILL — predniSONE 5 MG (48) TBPK: 5 | 12 days supply | Qty: 48 | Fill #0

## 2016-07-13 ENCOUNTER — Other Ambulatory Visit: Payer: Self-pay | Admitting: Internal Medicine

## 2016-07-13 NOTE — Telephone Encounter (Signed)
Refill x 90 days 

## 2016-08-04 ENCOUNTER — Telehealth (INDEPENDENT_AMBULATORY_CARE_PROVIDER_SITE_OTHER): Payer: Self-pay | Admitting: *Deleted

## 2016-08-04 NOTE — Telephone Encounter (Signed)
Pt called asking about financial assistance for the injections. Stated she emailed some info and that if you emailed back she missed it.

## 2016-08-06 NOTE — Telephone Encounter (Signed)
Patient aware I will mail her what Autumn Patty sent me for assistance

## 2016-09-02 ENCOUNTER — Other Ambulatory Visit: Payer: Self-pay | Admitting: Internal Medicine

## 2016-09-02 MED FILL — CITALOPRAM HBR 20 MG TABLET: 20 | 90 days supply | Qty: 90 | Fill #0 | Status: TO

## 2016-09-02 MED FILL — CELECOXIB 200 MG CAP: 200 | 90 days supply | Qty: 180 | Fill #0 | Status: TO

## 2016-09-02 MED FILL — diazePAM 5 MG TABS: 5 | 90 days supply | Qty: 90 | Fill #0

## 2016-09-02 MED FILL — CYCLOBENZAPRINE 10 MG TABLE: 10 | 90 days supply | Qty: 90 | Fill #1

## 2016-09-02 MED FILL — buPROPion HCL 75 MG TABS: 75 | 45 days supply | Qty: 180 | Fill #0 | Status: TO

## 2016-09-02 MED FILL — LEVOTHYROXINE 75 MCG TABLET: 75 | 90 days supply | Qty: 90 | Fill #0 | Status: TO

## 2016-09-02 NOTE — Telephone Encounter (Signed)
OK to refill but needs OV and TSH in late October. Needs to have TSH yearly and OV yearly. Says she cannot afford CPE

## 2016-09-07 ENCOUNTER — Telehealth (INDEPENDENT_AMBULATORY_CARE_PROVIDER_SITE_OTHER): Payer: Self-pay | Admitting: Orthopaedic Surgery

## 2016-09-07 NOTE — Telephone Encounter (Signed)
Patient called asking for the status on the knee injection she had to be approved for. CB # [phone]

## 2016-09-08 MED FILL — predniSONE 5 MG TABS: 5 | 28 days supply | Qty: 70 | Fill #0

## 2016-09-08 NOTE — Telephone Encounter (Signed)
IC Was advised still in process. Called patient to advise.

## 2016-09-08 NOTE — Telephone Encounter (Signed)
[phone] Contact for asst program with Monovisc

## 2016-09-08 NOTE — Telephone Encounter (Signed)
S/w patient. She submitted financial assistance paper work about 3 weeks ago for [employer]. I advised would call them to check status. Advised patient to please give me day or two to work on this for her.

## 2016-09-08 NOTE — Telephone Encounter (Signed)
Also gave her phone number for her to follow up

## 2016-09-08 NOTE — Telephone Encounter (Signed)
Check on status

## 2016-10-09 ENCOUNTER — Telehealth (INDEPENDENT_AMBULATORY_CARE_PROVIDER_SITE_OTHER): Payer: Self-pay | Admitting: Orthopaedic Surgery

## 2016-10-09 NOTE — Telephone Encounter (Signed)
Patient called asked for a call back concerning the form for monovisc injection. Patient asked if another form can be mailed to her. She advised Mary Herman and Mary Emery do not have the form. The number to contact patient is [phone]

## 2016-10-09 NOTE — Telephone Encounter (Signed)
Mailed to patient address per her request.

## 2016-11-30 ENCOUNTER — Encounter: Payer: Self-pay | Admitting: Internal Medicine

## 2016-11-30 ENCOUNTER — Ambulatory Visit (INDEPENDENT_AMBULATORY_CARE_PROVIDER_SITE_OTHER): Payer: Self-pay | Admitting: Internal Medicine

## 2016-11-30 VITALS — BP 138/94 | HR 90 | Temp 98.3°F | Wt 285.0 lb

## 2016-11-30 DIAGNOSIS — Z0000 Encounter for general adult medical examination without abnormal findings: Secondary | ICD-10-CM

## 2016-11-30 DIAGNOSIS — F329 Major depressive disorder, single episode, unspecified: Secondary | ICD-10-CM

## 2016-11-30 DIAGNOSIS — R7989 Other specified abnormal findings of blood chemistry: Secondary | ICD-10-CM

## 2016-11-30 DIAGNOSIS — M059 Rheumatoid arthritis with rheumatoid factor, unspecified: Secondary | ICD-10-CM

## 2016-11-30 DIAGNOSIS — F419 Anxiety disorder, unspecified: Secondary | ICD-10-CM

## 2016-11-30 DIAGNOSIS — R768 Other specified abnormal immunological findings in serum: Secondary | ICD-10-CM

## 2016-11-30 DIAGNOSIS — F32A Depression, unspecified: Secondary | ICD-10-CM

## 2016-11-30 DIAGNOSIS — R7302 Impaired glucose tolerance (oral): Secondary | ICD-10-CM

## 2016-11-30 DIAGNOSIS — E039 Hypothyroidism, unspecified: Secondary | ICD-10-CM

## 2016-11-30 DIAGNOSIS — F439 Reaction to severe stress, unspecified: Secondary | ICD-10-CM

## 2016-11-30 LAB — TSH: TSH: 1.4 mIU/L

## 2016-11-30 MED ORDER — TRAMADOL HCL 50 MG PO TABS: 50.0000 mg | ORAL_TABLET | Freq: Three times a day (TID) | ORAL | 0 refills | Status: DC | PRN

## 2016-11-30 MED ORDER — BUPROPION HCL 75 MG PO TABS: 150.0000 mg | ORAL_TABLET | Freq: Two times a day (BID) | ORAL | 3 refills | Status: DC

## 2016-11-30 MED ORDER — HYDROCODONE-ACETAMINOPHEN 10-325 MG PO TABS: 1.0000 | ORAL_TABLET | ORAL | 0 refills | Status: DC | PRN

## 2016-11-30 MED ORDER — CYCLOBENZAPRINE HCL 10 MG PO TABS: 10.0000 mg | ORAL_TABLET | Freq: Every day | ORAL | 1 refills | Status: DC

## 2016-11-30 MED ORDER — LEVOTHYROXINE SODIUM 75 MCG PO TABS: 75.0000 ug | ORAL_TABLET | Freq: Every day | ORAL | 3 refills | Status: DC

## 2016-11-30 MED ORDER — CITALOPRAM HYDROBROMIDE 20 MG PO TABS: 20.0000 mg | ORAL_TABLET | Freq: Every morning | ORAL | 3 refills | Status: DC

## 2016-11-30 MED ORDER — MONTELUKAST SODIUM 10 MG PO TABS: 10.0000 mg | ORAL_TABLET | Freq: Every day | ORAL | 3 refills | Status: DC

## 2016-11-30 MED ORDER — CELECOXIB 200 MG PO CAPS: 200.0000 mg | ORAL_CAPSULE | Freq: Two times a day (BID) | ORAL | 3 refills | Status: DC

## 2016-11-30 MED ORDER — DIAZEPAM 5 MG PO TABS: ORAL_TABLET | ORAL | 0 refills | Status: DC

## 2016-11-30 MED FILL — CYCLOBENZAPRINE 10 MG TABLE: 10 | 90 days supply | Qty: 90 | Fill #0

## 2016-11-30 MED FILL — LEVOTHYROXINE 75 MCG TABLET: 75 | 90 days supply | Qty: 90 | Fill #1 | Status: TO

## 2016-11-30 MED FILL — CITALOPRAM HBR 20 MG TABLET: 20 | 90 days supply | Qty: 90 | Fill #1 | Status: TO

## 2016-11-30 MED FILL — CELECOXIB 200 MG CAPS: 200 | 30 days supply | Qty: 60 | Fill #1 | Status: TO

## 2016-11-30 MED FILL — MONTELUKAST SOD 10 MG TAB: 10 | 90 days supply | Qty: 90 | Fill #0

## 2016-11-30 MED FILL — diazePAM 5 MG TABS: 5 | 90 days supply | Qty: 90 | Fill #0

## 2016-11-30 MED FILL — HYDROCODON-APAP 10-325: 10-325 | 5 days supply | Qty: 30 | Fill #0

## 2016-11-30 MED FILL — buPROPion HCL 75 MG TABS: 75 | 45 days supply | Qty: 180 | Fill #1 | Status: TO

## 2016-11-30 MED FILL — traMADol HCL 50 MG TABS: 50 | 30 days supply | Qty: 90 | Fill #0

## 2016-12-01 LAB — HEMOGLOBIN A1C
Hgb A1c MFr Bld: 5.7 % of total Hgb — ABNORMAL HIGH (ref <5.7)
Mean Plasma Glucose: 117 (calc)
eAG (mmol/L): 6.5 (calc)

## 2016-12-02 NOTE — Progress Notes (Signed)
   Subjective:    Patient ID: Mary Herman, female    DOB: 1960/02/24, 56 y.o.   MRN: [id number]  HPI 56 year old Female in today to follow-up on multiple medical issues including rheumatoid arthritis diagnosed in May 2018 with hand and foot pain. CCP was greater than 250, rheumatoid factor 123, and sedimentation rate 15. Also has osteoarthritis of both knees followed by Dr. Ninfa Linden.  History of anxiety, depression and hypothyroidism.  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 Has not found full-time employment and therefore has no insurance benefits. Plays out-of-pocket for visits.  History of allergic rhinitis and asthma. She takes Singulair.  History of situational stress. Apparently  seen by rheumatologist about every 4 months. She is on Enbrel.  Today we checked hemoglobin A1c which was excellent at 5.7%.  TSH was normal at 1.40  Review of Systems     Objective:   Physical Exam No thyromegaly.  Chest clear to auscultation.  Cardiac exam regular rate and rhythm normal S1 and S2.  Extremities without edema.       Assessment & Plan:  Rheumatoid arthritis with chronic pain.  Prescription for hydrocodone APAP 10/325 #30 one p.o. every 4 hours as needed pain with no refill.  Flexeril if needed for musculoskeletal pain.    Hypothyroidism  Impaired glucose tolerance  Anxiety depression treated with Wellbutrin and Celexa  Insomnia-treated with Valium 5 mg at bedtime  Currently does not have health insurance and has not had a physical exam in some time.  Return in 3-6 months.

## 2016-12-19 DIAGNOSIS — M069 Rheumatoid arthritis, unspecified: Secondary | ICD-10-CM | POA: Insufficient documentation

## 2016-12-19 NOTE — Patient Instructions (Signed)
Continue medications as previously prescribed.   TSH and hemoglobin A1c checked today and are stable.  Return in 3-6 months.  Consider physical exam in the near future.

## 2017-02-18 ENCOUNTER — Encounter (HOSPITAL_COMMUNITY): Payer: Self-pay

## 2017-03-03 MED FILL — buPROPion HCL 75 MG TABS: 75 | 45 days supply | Qty: 180 | Fill #2 | Status: TO

## 2017-03-03 MED FILL — CITALOPRAM HBR 20 MG TABLET: 20 | 90 days supply | Qty: 90 | Fill #2 | Status: TO

## 2017-03-03 MED FILL — LEVOTHYROXINE 75 MCG TABLET: 75 | 90 days supply | Qty: 90 | Fill #2 | Status: TO

## 2017-03-03 MED FILL — MONTELUKAST SOD 10 MG TAB: 10 | 90 days supply | Qty: 90 | Fill #1 | Status: TO

## 2017-03-04 ENCOUNTER — Other Ambulatory Visit: Payer: Self-pay | Admitting: Internal Medicine

## 2017-03-04 MED FILL — CYCLOBENZAPRINE 10 MG TAB: 10 | 90 days supply | Qty: 90 | Fill #1

## 2017-03-04 MED FILL — CELECOXIB 200 MG CAPSULE: 200 | 90 days supply | Qty: 180 | Fill #2 | Status: TO

## 2017-03-04 MED FILL — diazePAM 5 MG TABS: 5 | 90 days supply | Qty: 90 | Fill #0

## 2017-03-04 NOTE — Telephone Encounter (Signed)
Needs appt in March. Refill x 90 days

## 2017-03-04 NOTE — Telephone Encounter (Signed)
LEFT MESSAGE PATIENT NEEDS TO SCHEDULE APPT.

## 2017-03-04 NOTE — Telephone Encounter (Signed)
CALLED IN TO [place], [place]

## 2017-04-02 ENCOUNTER — Other Ambulatory Visit: Payer: Self-pay

## 2017-04-02 DIAGNOSIS — I10 Essential (primary) hypertension: Secondary | ICD-10-CM

## 2017-04-02 DIAGNOSIS — Z1321 Encounter for screening for nutritional disorder: Secondary | ICD-10-CM

## 2017-04-02 DIAGNOSIS — E039 Hypothyroidism, unspecified: Secondary | ICD-10-CM

## 2017-04-02 DIAGNOSIS — Z0000 Encounter for general adult medical examination without abnormal findings: Secondary | ICD-10-CM

## 2017-04-02 DIAGNOSIS — E785 Hyperlipidemia, unspecified: Secondary | ICD-10-CM

## 2017-04-29 ENCOUNTER — Ambulatory Visit: Payer: Self-pay | Admitting: Internal Medicine

## 2017-04-29 ENCOUNTER — Other Ambulatory Visit: Payer: Self-pay | Admitting: Internal Medicine

## 2017-04-29 ENCOUNTER — Encounter: Payer: Self-pay | Admitting: Internal Medicine

## 2017-04-29 VITALS — BP 120/90 | HR 104 | Ht 63.0 in | Wt 286.0 lb

## 2017-04-29 DIAGNOSIS — E611 Iron deficiency: Secondary | ICD-10-CM

## 2017-04-29 DIAGNOSIS — I10 Essential (primary) hypertension: Secondary | ICD-10-CM

## 2017-04-29 DIAGNOSIS — M059 Rheumatoid arthritis with rheumatoid factor, unspecified: Secondary | ICD-10-CM

## 2017-04-29 DIAGNOSIS — E785 Hyperlipidemia, unspecified: Secondary | ICD-10-CM

## 2017-04-29 DIAGNOSIS — M170 Bilateral primary osteoarthritis of knee: Secondary | ICD-10-CM

## 2017-04-29 DIAGNOSIS — Z1321 Encounter for screening for nutritional disorder: Secondary | ICD-10-CM

## 2017-04-29 DIAGNOSIS — F419 Anxiety disorder, unspecified: Secondary | ICD-10-CM

## 2017-04-29 DIAGNOSIS — R7302 Impaired glucose tolerance (oral): Secondary | ICD-10-CM

## 2017-04-29 DIAGNOSIS — E039 Hypothyroidism, unspecified: Secondary | ICD-10-CM

## 2017-04-29 DIAGNOSIS — F329 Major depressive disorder, single episode, unspecified: Secondary | ICD-10-CM

## 2017-04-29 DIAGNOSIS — M19049 Primary osteoarthritis, unspecified hand: Secondary | ICD-10-CM

## 2017-04-29 DIAGNOSIS — F439 Reaction to severe stress, unspecified: Secondary | ICD-10-CM

## 2017-04-29 DIAGNOSIS — Z0000 Encounter for general adult medical examination without abnormal findings: Secondary | ICD-10-CM

## 2017-04-29 DIAGNOSIS — E559 Vitamin D deficiency, unspecified: Secondary | ICD-10-CM

## 2017-04-29 DIAGNOSIS — R718 Other abnormality of red blood cells: Secondary | ICD-10-CM

## 2017-04-29 DIAGNOSIS — F32A Depression, unspecified: Secondary | ICD-10-CM

## 2017-04-29 LAB — POCT URINALYSIS DIPSTICK
APPEARANCE: ABNORMAL
BILIRUBIN UA: NEGATIVE
Glucose, UA: 500
Leukocytes, UA: NEGATIVE
Nitrite, UA: NEGATIVE
ODOR: ABNORMAL
PH UA: 6 (ref 5.0–8.0)
Spec Grav, UA: >=1.03 — AB (ref 1.010–1.025)
Urobilinogen, UA: 0.2 E.U./dL

## 2017-04-29 MED ORDER — HYDROCODONE-ACETAMINOPHEN 10-325 MG PO TABS: 1.0000 | ORAL_TABLET | ORAL | 0 refills | Status: DC | PRN

## 2017-04-29 MED ORDER — CYCLOBENZAPRINE HCL 10 MG PO TABS: 10.0000 mg | ORAL_TABLET | Freq: Every day | ORAL | 1 refills | Status: DC

## 2017-04-29 MED ORDER — DIAZEPAM 5 MG PO TABS: ORAL_TABLET | ORAL | 0 refills | Status: DC

## 2017-04-29 NOTE — Progress Notes (Signed)
Subjective:    Patient ID: Mary Herman, female    DOB: [birth year], 57 y.o.   MRN: [id number]  HPI  57 year old Female for health maintenance exam and evaluation of medical issues.  Had biopsy  and hysterosonogram Dec 2015 that was benign by Dr. Phineas Real.  Sees rheumatologist at [facility].Hx positive RF. Is on weekly injections of Enbrel.  Recent bout of bronchitis for which she took antibiotics.  History of hypothyroidism, gastric bypass surgery, depression, allergic rhinitis, vitamin D deficiency, history of chronic back pain.  She is allergic to cats and has cats in her home.  She had gastric bypass surgery 2009 by Dr. Hassell Done.  Has had interstitial cystitis in the remote past.  History of  TMJ syndrome.  Had arthroscopic surgery left knee some 25 years ago.  Status post cholecystectomy.  Had C-section 1998.  Codeine causes a rash.  Social history: She is married.  Husband is disabled.  Has a high school education.  Does not smoke or consume alcohol.  She has 1 daughter.  Says daughter has had a number of operations on her femur.  Patient now working in a contracted position at [employer].  Considerable stress and financial issues.  At one point had to file bankruptcy in 2016.  Family history: Father died at age 40 with history of alcoholism but cause of death not known to patient.  Mother with history of brain aneurysm 10 years prior to her death.  Mother died at age 73 with history of stroke and had lung disease.  No sisters.  One brother age 1 with history of hypertension.  One brother in his 33s whose health she does not know about.    Review of Systems  Respiratory: Negative.   Cardiovascular: Negative.   Gastrointestinal: Negative.   Musculoskeletal: Positive for arthralgias and joint swelling.  Neurological: Positive for headaches.  Hematological: Negative.   Psychiatric/Behavioral: Positive for dysphoric mood.       Objective:   Physical Exam    Constitutional: She is oriented to person, place, and time. She appears well-developed and well-nourished. No distress.  HENT:  Head: Normocephalic and atraumatic.  Right Ear: External ear normal.  Left Ear: External ear normal.  Mouth/Throat: Oropharynx is clear and moist.  Eyes: Pupils are equal, round, and reactive to light. Conjunctivae and EOM are normal.  Neck: No JVD present. No thyromegaly present.  Cardiovascular: Normal rate, regular rhythm, normal heart sounds and intact distal pulses.  No murmur heard. Pulmonary/Chest: Breath sounds normal. No respiratory distress. She has no wheezes. She has no rales. She exhibits no tenderness.  Breasts normal female  Abdominal: Soft. Bowel sounds are normal. She exhibits no distension and no mass. There is no tenderness. There is no rebound and no guarding.  Genitourinary:  Genitourinary Comments: Pap not done  Musculoskeletal: She exhibits no edema.  Lymphadenopathy:    She has no cervical adenopathy.  Neurological: She is alert and oriented to person, place, and time.  Skin: Skin is warm and dry. No rash noted. She is not diaphoretic.  Psychiatric: She has a normal mood and affect. Her behavior is normal. Judgment and thought content normal.  Affect is always flat  Vitals reviewed.         Assessment & Plan:  Microcytosis-iron studies show patient to be iron deficient with a level of 41.  Ferritin is low at 6.  She is status post gastric bypass surgery.  Needs iron supplement.   Hyperlipidemia-total  cholesterol 213 with an LDL cholesterol of 115  Vitamin D deficiency-level is 12 will need supplementation  Impaired glucose tolerance hemoglobin A1c 6%  Hypothyroidism-TSH is normal  History of rheumatoid arthritis-currently being treated with Enbrel  Depression takes Celexa and Wellbutrin  Anxiety takes Valium as needed  Status post gastric bypass surgery  Chronic pain  History of asthma take Singulair  Plan: She  needs to take iron supplementation and follow-up in 3 months.  Valium refilled #90 with no refill which she takes 1 at bedtime.  Flexeril refill for musculoskeletal pain.  Take vitamin D supplement 50,000 units weeklyed for 12 weeks and 2000 units daily.  Small prescription of hydrocodone APAP 10/325 sparingly #30 to take for musculoskeletal pain  Continue Celexa and Wellbutrin

## 2017-04-30 ENCOUNTER — Telehealth: Payer: Self-pay

## 2017-04-30 LAB — COMPLETE METABOLIC PANEL WITH GFR
AG Ratio: 1.4 (calc) (ref 1.0–2.5)
ALBUMIN MSPROF: 3.8 g/dL (ref 3.6–5.1)
ALT: 12 U/L (ref 6–29)
AST: 17 U/L (ref 10–35)
Alkaline phosphatase (APISO): 118 U/L (ref 33–130)
BILIRUBIN TOTAL: 0.8 mg/dL (ref 0.2–1.2)
BUN: 11 mg/dL (ref 7–25)
CALCIUM: 8.9 mg/dL (ref 8.6–10.4)
CO2: 28 mmol/L (ref 20–32)
Chloride: 105 mmol/L (ref 98–110)
Creat: 0.84 mg/dL (ref 0.50–1.05)
GFR, EST AFRICAN AMERICAN: 91 mL/min/{1.73_m2} (ref >=60)
GFR, EST NON AFRICAN AMERICAN: 79 mL/min/{1.73_m2} (ref >=60)
GLUCOSE: 107 mg/dL — AB (ref 65–99)
Globulin: 2.7 g/dL (calc) (ref 1.9–3.7)
Potassium: 4.9 mmol/L (ref 3.5–5.3)
Sodium: 140 mmol/L (ref 135–146)
TOTAL PROTEIN: 6.5 g/dL (ref 6.1–8.1)

## 2017-04-30 LAB — VITAMIN D 25 HYDROXY (VIT D DEFICIENCY, FRACTURES): VIT D 25 HYDROXY: 12 ng/mL — AB (ref 30–100)

## 2017-04-30 LAB — CBC WITH DIFFERENTIAL/PLATELET
BASOS ABS: 9 {cells}/uL (ref 0–200)
Basophils Relative: 0.2 %
EOS ABS: 141 {cells}/uL (ref 15–500)
Eosinophils Relative: 3 %
HEMATOCRIT: 40.1 % (ref 35.0–45.0)
HEMOGLOBIN: 12.4 g/dL (ref 11.7–15.5)
LYMPHS ABS: 1382 {cells}/uL (ref 850–3900)
MCH: 24.3 pg — AB (ref 27.0–33.0)
MCHC: 30.9 g/dL — AB (ref 32.0–36.0)
MCV: 78.6 fL — AB (ref 80.0–100.0)
MPV: 10.2 fL (ref 7.5–12.5)
Monocytes Relative: 6.1 %
NEUTROS ABS: 2881 {cells}/uL (ref 1500–7800)
NEUTROS PCT: 61.3 %
Platelets: 281 10*3/uL (ref 140–400)
RBC: 5.1 10*6/uL (ref 3.80–5.10)
RDW: 14 % (ref 11.0–15.0)
Total Lymphocyte: 29.4 %
WBC: 4.7 10*3/uL (ref 3.8–10.8)
WBCMIX: 287 {cells}/uL (ref 200–950)

## 2017-04-30 LAB — FERRITIN: Ferritin: 6 ng/mL — ABNORMAL LOW (ref 10–232)

## 2017-04-30 LAB — TEST AUTHORIZATION

## 2017-04-30 LAB — LIPID PANEL
CHOLESTEROL: 213 mg/dL — AB (ref <200)
HDL: 79 mg/dL (ref >50)
LDL Cholesterol (Calc): 115 mg/dL (calc) — ABNORMAL HIGH
NON-HDL CHOLESTEROL (CALC): 134 mg/dL — AB (ref <130)
Total CHOL/HDL Ratio: 2.7 (calc) (ref <5.0)
Triglycerides: 91 mg/dL (ref <150)

## 2017-04-30 LAB — HEMOGLOBIN A1C
EAG (MMOL/L): 7 (calc)
HEMOGLOBIN A1C: 6 %{Hb} — AB (ref <5.7)
Mean Plasma Glucose: 126 (calc)

## 2017-04-30 LAB — IRON, TOTAL/TOTAL IRON BINDING CAP
%SAT: 9 % (calc) — ABNORMAL LOW (ref 11–50)
IRON: 41 ug/dL — AB (ref 45–160)
TIBC: 450 ug/dL (ref 250–450)

## 2017-04-30 LAB — TSH: TSH: 1.55 mIU/L

## 2017-04-30 MED ORDER — VITAMIN D (ERGOCALCIFEROL) 1.25 MG (50000 UNIT) PO CAPS: 50000.0000 [IU] | ORAL_CAPSULE | ORAL | 1 refills | Status: DC

## 2017-04-30 NOTE — Telephone Encounter (Signed)
Spoke with patient and notified per Dr. Renold Genta prescribed vit D 50000iu once a week for 12 weeks after that get OTC vit D 2000iu once a day. Pt was notified of instructions, pt verbalized understanding.

## 2017-05-17 ENCOUNTER — Ambulatory Visit (INDEPENDENT_AMBULATORY_CARE_PROVIDER_SITE_OTHER): Payer: Self-pay | Admitting: Gynecology

## 2017-05-17 ENCOUNTER — Encounter: Payer: Self-pay | Admitting: Gynecology

## 2017-05-17 VITALS — BP 150/90 | Ht 64.0 in | Wt 292.0 lb

## 2017-05-17 DIAGNOSIS — Z124 Encounter for screening for malignant neoplasm of cervix: Secondary | ICD-10-CM

## 2017-05-17 DIAGNOSIS — N950 Postmenopausal bleeding: Secondary | ICD-10-CM

## 2017-05-17 NOTE — Progress Notes (Signed)
    Mary Herman October 09, 1960 [id number]        57 y.o.  G1P1001 who has not been in the office for over 3 years presents complaining of postmenopausal bleeding.  She has a history of irregular bleeding in 2015.  Had evaluation to include a negative sonohysterogram and biopsy.  She was doing intermittent progesterone withdrawals to regulate her bleeding at that time.  She was no longer having withdrawals after the progesterone and ultimately stopped the Provera and did no further bleeding until this year where she had a menses like flow several months ago and then over the last several months has been having some spotting on and off.  Is not having significant discomfort/cramping, bloating or breast tenderness with these bleeding episodes.  Past medical history,surgical history, problem list, medications, allergies, family history and social history were all reviewed and documented in the EPIC chart.  Directed ROS with pertinent positives and negatives documented in the history of present illness/assessment and plan.  Exam: [name] assistant Vitals:   05/17/17 1146  BP: (!) 150/90  Weight: 292 lb (132.5 kg)  Height: 5\' 4"  (1.626 m)   General appearance:  Normal Abdomen without gross masses or tenderness. Pelvic external BUS vagina with light staining.  Cervix grossly normal high in the vagina.  Pap smear done.  Bimanual without gross masses or tenderness.  Unable to palpate her uterus.  Assessment/Plan:  57 y.o. G1P1001 with postmenopausal bleeding.  We discussed various possibilities to include perimenopausal bleeding where she still is having some ovulatory function, atrophic bleeding although a bit young for that, structural abnormalities such as polyps and submucous myomas as well as neoplastic with hyperplasia/endometrial carcinoma.  Given the limits of her exam recommend sonohysterogram for better pelvic surveillance as well as allow for endometrial assessment and sampling.  Patient  agrees with the plan and will schedule her appointment.  Greater than 50% of my 30 minute visit was spent in review of her prior records and direct face to face counseling and coordination of care with the patient.   Anastasio Auerbach MD, 12:21 PM 05/17/2017

## 2017-05-17 NOTE — Patient Instructions (Signed)
Follow up for ultrasound as scheduled 

## 2017-05-17 NOTE — Addendum Note (Signed)
Addended by: Nelva Nay on: 05/17/2017 12:31 PM   Modules accepted: Orders

## 2017-05-18 LAB — PAP IG W/ RFLX HPV ASCU

## 2017-05-23 NOTE — Patient Instructions (Signed)
Patient needs to watch diet frequent carbohydrates due to the impaired glucose tolerance.  Needs iron sulfate 325 mg twice daily for iron deficiency likely related to gastric bypass surgery.  Needs follow-up in 3 months.

## 2017-06-08 ENCOUNTER — Other Ambulatory Visit: Payer: Self-pay | Admitting: Gynecology

## 2017-06-08 DIAGNOSIS — N950 Postmenopausal bleeding: Secondary | ICD-10-CM

## 2017-06-09 ENCOUNTER — Other Ambulatory Visit: Payer: Self-pay

## 2017-06-09 ENCOUNTER — Ambulatory Visit: Payer: Self-pay | Admitting: Gynecology

## 2017-06-21 ENCOUNTER — Ambulatory Visit (INDEPENDENT_AMBULATORY_CARE_PROVIDER_SITE_OTHER): Payer: Self-pay

## 2017-06-21 ENCOUNTER — Ambulatory Visit: Payer: Self-pay | Admitting: Gynecology

## 2017-06-21 ENCOUNTER — Encounter: Payer: Self-pay | Admitting: Gynecology

## 2017-06-21 VITALS — BP 140/90

## 2017-06-21 DIAGNOSIS — N950 Postmenopausal bleeding: Secondary | ICD-10-CM

## 2017-06-21 DIAGNOSIS — N840 Polyp of corpus uteri: Secondary | ICD-10-CM

## 2017-06-21 NOTE — Progress Notes (Signed)
    Mary Herman [birth year] [id number]        56 y.o.  G1P1001 presents for sonohysterogram.  History of no menses with a past several years until several months ago was when she had a menstrual-like flow.  Over the past several months she has had several episodes of spotting on and off.  Past medical history,surgical history, problem list, medications, allergies, family history and social history were all reviewed and documented in the EPIC chart.  Directed ROS with pertinent positives and negatives documented in the history of present illness/assessment and plan.  Exam: Pam Falls assistant Vitals:   06/21/17 1523  BP: 140/90   General appearance:  Normal Abdomen soft nontender without masses guarding rebound Pelvic external BUS vagina normal.  Cervix normal.  Uterus unable to palpate but no gross masses or tenderness.  Adnexa without gross masses or tenderness.  Ultrasound transvaginal and transabdominal shows uterus normal size with several small myomas largest measuring 15 mm.  Endometrial echo 7.5 mm.  Echogenic focus noted 8 x 6 mm.  Right and left ovaries not visualized but no pathology noted in the right or left adnexa.  Cul-de-sac negative.  Sonohysterogram performed, sterile technique, easy catheter introduction, good distention with 15 x 4 mm endometrial defect noted consistent with endometrial polyp.  Endometrial biopsy taken.  Patient tolerated well.  Assessment/Plan:  57 y.o. G1P1001 with postmenopausal bleeding and sonohysterogram consistent with endometrial polyp.  Discussed situation with the patient and reviewed the ultrasound with her.  Recommended proceeding with hysteroscopy D&C with resection of the endometrial polyp.  Patient's unsure from a financial standpoint whether she can afford this and asked me about if she would do nothing.  I discussed with her that I did an endometrial biopsy and if this is negative that is reassuring but is a limited sampling of the  endometrium and cannot guarantee the full histology.  Differential to include benign endometrial polyp up to and including uterine cancer reviewed.  If the patient decides against proceeding with the hysteroscopy D&C then she would accept the risk of significant endometrial pathology.  She also understands that she will more than likely continue to bleed off and on until the polyp is removed.  Patient will follow-up for the biopsy results and her ultimate decision as far as proceeding with the hysteroscopy D&C.    Anastasio Auerbach MD, 4:22 PM 06/21/2017

## 2017-06-21 NOTE — Patient Instructions (Signed)
Office will call with biopsy results and to discussed the hysteroscopy D&C.

## 2017-06-23 ENCOUNTER — Telehealth: Payer: Self-pay

## 2017-06-23 NOTE — Telephone Encounter (Signed)
I called patient to discuss surgery Dr. Loetta Rough recommended and her estimated cost. Dr. Loetta Rough had relayed that patient has no insurance and would like to get an idea about her financial responsibility. I spoke with her and gave her GGA surgery prepymt amount with PP discount.  She was provided the number for the [facility] so she can call and check with the about [place] charges/discount.  I have left two messages with [facility] Yesterday and today. I told patient as soon as I hear from them I will call her but that she should try to call and talk with them as well.

## 2017-07-02 ENCOUNTER — Telehealth: Payer: Self-pay

## 2017-07-02 NOTE — Telephone Encounter (Signed)
Encounter opened in error

## 2017-09-08 ENCOUNTER — Other Ambulatory Visit: Payer: Self-pay | Admitting: Internal Medicine

## 2017-09-08 MED FILL — MONTELUKAST SOD 10 MG TAB: 10 | 90 days supply | Qty: 90 | Fill #0

## 2017-09-08 MED FILL — diazePAM 5 MG TABS: 5 | 90 days supply | Qty: 90 | Fill #0

## 2017-09-08 MED FILL — CYCLOBENZAPRINE HCL 10 MG T: 10 | 90 days supply | Qty: 90 | Fill #0

## 2017-09-09 MED FILL — CITALOPRAM HBR 20 MG TABLET: 20 | 90 days supply | Qty: 90 | Fill #0

## 2017-09-09 MED FILL — LEVOTHYROXINE 75 MCG TABLET: 75 | 90 days supply | Qty: 90 | Fill #0

## 2017-09-09 MED FILL — CELECOXIB 200 MG CAP: 200 | 90 days supply | Qty: 180 | Fill #0

## 2017-09-09 MED FILL — buPROPion HCL 75 MG TABS: 75 | 45 days supply | Qty: 180 | Fill #0

## 2017-11-05 ENCOUNTER — Ambulatory Visit: Payer: Self-pay | Admitting: Internal Medicine

## 2017-11-05 ENCOUNTER — Encounter: Payer: Self-pay | Admitting: Internal Medicine

## 2017-11-05 VITALS — BP 142/110 | HR 102 | Temp 98.0°F | Ht 64.0 in | Wt 288.0 lb

## 2017-11-05 DIAGNOSIS — R002 Palpitations: Secondary | ICD-10-CM

## 2017-11-05 DIAGNOSIS — E039 Hypothyroidism, unspecified: Secondary | ICD-10-CM

## 2017-11-05 DIAGNOSIS — M059 Rheumatoid arthritis with rheumatoid factor, unspecified: Secondary | ICD-10-CM

## 2017-11-05 DIAGNOSIS — F329 Major depressive disorder, single episode, unspecified: Secondary | ICD-10-CM

## 2017-11-05 DIAGNOSIS — F439 Reaction to severe stress, unspecified: Secondary | ICD-10-CM

## 2017-11-05 DIAGNOSIS — R7302 Impaired glucose tolerance (oral): Secondary | ICD-10-CM

## 2017-11-05 DIAGNOSIS — F419 Anxiety disorder, unspecified: Secondary | ICD-10-CM

## 2017-11-05 LAB — TSH: TSH: 1.41 mIU/L

## 2017-11-05 MED ORDER — METOPROLOL SUCCINATE ER 25 MG PO TB24: 25.0000 mg | ORAL_TABLET | Freq: Every day | ORAL | 3 refills | Status: DC

## 2017-11-05 MED ORDER — PREDNISONE 10 MG PO TABS: ORAL_TABLET | ORAL | 0 refills | Status: DC

## 2017-11-05 MED FILL — predniSONE 10 MG TABS: 10 | 6 days supply | Qty: 21 | Fill #0

## 2017-11-05 MED FILL — METOPROLOL SUCCINATE ER 25: 25 | 30 days supply | Qty: 30 | Fill #0

## 2017-11-05 NOTE — Progress Notes (Signed)
   Subjective:    Patient ID: Mary Herman, female    DOB: March 10, 1960, 57 y.o.   MRN: [id number]  HPI 57 year old Female for 6 month recheck. Hx Hypothyroidism, rheumatoid arthritis anxiety and depression, situational stress, palpitations.  History of impaired glucose tolerance.  May be losing job at [employer].  Has been working for [employer], [employer].  She is not sure if she is going to get to stay on with this position or not.  Is created stress.  TSH checked and was within normal limits.  She is been having intermittent palpitations.  Cannot really afford to go to cardiologist at this point time she says.  EKG performed today shows mild sinus tachycardia rate 96 with no acute changes and no ectopy.    Review of Systems situational  stress with job     Objective:   Physical Exam BP 142/110 pulse 102.  No thyromegaly.  No JVD.  Chest clear.  Cardiac exam: Regular rate and rhythm with occasional extrasystole.  No lower extremity edema.       Assessment & Plan:  Palpitations-start metoprolol  Elevated blood pressure-start metoprolol  Situational stress with job  Hypothyroidism-stable TSH  History of rheumatoid arthritis-treated with Enbrel  History of impaired glucose tolerance-hemoglobin A1c checked in March and was 6%  Anxiety and depression  Plan: Says she cannot afford to see cardiologist.  Place patient on metoprolol XL 25 mg daily for palpitations.  This will also help her blood pressure.  Blood pressure seems to be elevated due to situational stress.  Considerable musculoskeletal pain in joints.  Have given short course of prednisone taper to good from 60 mg to 0 mg over 7 days.  Continue same dose of thyroid replacement.  Follow-up in 2 weeks.  25 minutes spent with patient discussing these multiple issues

## 2017-11-16 ENCOUNTER — Encounter: Payer: Self-pay | Admitting: Internal Medicine

## 2017-11-16 ENCOUNTER — Ambulatory Visit (INDEPENDENT_AMBULATORY_CARE_PROVIDER_SITE_OTHER): Payer: Self-pay | Admitting: Internal Medicine

## 2017-11-16 VITALS — BP 120/88 | HR 90 | Temp 98.0°F | Ht 64.0 in | Wt 283.0 lb

## 2017-11-16 DIAGNOSIS — R002 Palpitations: Secondary | ICD-10-CM

## 2017-11-16 DIAGNOSIS — R030 Elevated blood-pressure reading, without diagnosis of hypertension: Secondary | ICD-10-CM

## 2017-11-16 MED ORDER — METOPROLOL SUCCINATE ER 25 MG PO TB24: ORAL_TABLET | ORAL | 3 refills | Status: DC

## 2017-11-16 NOTE — Progress Notes (Signed)
   Subjective:    Patient ID: Mary Herman, female    DOB: 1960/11/03, 57 y.o.   MRN: [id number]  HPI 57 year old Female in to follow up on elevated blood pressure, palpitations, and situational stress.  At last visit she was put on metoprolol XL 25 mg daily for complaint of palpitations.  EKG does showed mild sinus tachycardia with no acute changes despite complaining of fluttering in her chest.  She did not want to have a 24-hour Holter monitor or see cardiologist due to expense.  Her job situation with [employer] is still up in the air and she does not know if she will be laid off and she is under a lot of stress.  She thinks this is aggravating the palpitations.  Does not consume alcohol or significant amounts of caffeine.  The metoprolol has helped but one evening she noticed more chest fluttering.  Denies chest pain or shortness of breath.       Review of Systems overall she is improved from last visit     Objective:   Physical Exam Skin warm and dry.  Chest clear to auscultation.  Cardiac exam regular rate and rhythm with one extrasystole noted.  Her blood pressure was elevated last visit and is now 120/88.  She remains overweight with BMI 48.58.  Pulse oximetry 98% and pulse is 90.       Assessment & Plan:  Palpitations  Situational stress  Elevated blood pressure-improved with metoprolol  Plan: She will continue with metoprolol XL 25 mg every morning and if she has palpitations in the evening she may take an additional dose of metoprolol.  She does not want to see cardiologist at this time due to expense.  Her physical exam was due in March and she may return in March for physical examination.  Otherwise continue same medications

## 2017-11-16 NOTE — Patient Instructions (Signed)
Continue metoprolol XL 25 mg daily.  May take an additional dose in the evening if having issues with palpitations.  Return in March for physical examination.

## 2017-11-22 NOTE — Patient Instructions (Signed)
Start metoprolol XL 25 mg daily and follow-up in 2 weeks.  TSH is within normal limits.  Continue to watch diet.  Continue same dose of thyroid replacement.

## 2017-11-24 ENCOUNTER — Encounter (INDEPENDENT_AMBULATORY_CARE_PROVIDER_SITE_OTHER): Payer: Self-pay | Admitting: Physician Assistant

## 2017-11-24 ENCOUNTER — Ambulatory Visit (INDEPENDENT_AMBULATORY_CARE_PROVIDER_SITE_OTHER): Payer: Self-pay | Admitting: Physician Assistant

## 2017-11-24 VITALS — Ht 64.0 in | Wt 300.0 lb

## 2017-11-24 DIAGNOSIS — M1712 Unilateral primary osteoarthritis, left knee: Secondary | ICD-10-CM

## 2017-11-24 MED ORDER — METHYLPREDNISOLONE ACETATE 40 MG/ML IJ SUSP
40.0000 mg | INTRAMUSCULAR | Status: AC | PRN
  Administered 2017-11-24: 40 mg via INTRA_ARTICULAR

## 2017-11-24 MED ORDER — LIDOCAINE HCL 1 % IJ SOLN
3.0000 mL | INTRAMUSCULAR | Status: AC | PRN
  Administered 2017-11-24: 3 mL

## 2017-11-24 NOTE — Progress Notes (Signed)
   Procedure Note  Patient: Mary Herman             Date of Birth: 1960-11-29           MRN: [id number]             Visit Date: 11/24/2017 HPI Mrs. Mary Herman is well-known to Dr. Ninfa Linden she has known chronic pain in both her knees and osteoarthritis of both knees.  She last was seen by Dr. Ninfa Linden in May of this year he was given a cortisone injection left knee.  She comes in today requesting injection in her left knee.  She is had no new injury.  States most of her pain is in the back of the knee.  Pain is been ongoing for the past month and a half.  Physical exam left knee she has tenderness along medial joint line.  She lacks the last few degrees and full extension and flexes to 105 degrees.  No instability valgus varus stressing no abnormal warmth erythema.  Plus minus effusion. Procedures: Visit Diagnoses: Unilateral primary osteoarthritis, left knee  Large Joint Inj: L knee on 11/24/2017 3:40 PM Indications: pain Details: 22 G 1.5 in needle, superolateral approach  Arthrogram: No  Medications: 3 mL lidocaine 1 %; 40 mg methylPREDNISolone acetate 40 MG/ML Outcome: tolerated well, no immediate complications Procedure, treatment alternatives, risks and benefits explained, specific risks discussed. Consent was given by the patient. Immediately prior to procedure a time out was called to verify the correct patient, procedure, equipment, support staff and site/side marked as required. Patient was prepped and draped in the usual sterile fashion.    Plan: She will follow-up on an as-needed basis.  She understands that she can have cortisone injections no more often than every 3 months.  Questions encouraged and answered.

## 2017-12-06 ENCOUNTER — Other Ambulatory Visit: Payer: Self-pay | Admitting: Internal Medicine

## 2017-12-06 MED FILL — diazePAM 5 MG TABS: 5 | 90 days supply | Qty: 90 | Fill #0

## 2017-12-06 MED FILL — METOPROLOL SUCCINATE ER 25: 25 | 30 days supply | Qty: 30 | Fill #1

## 2017-12-24 ENCOUNTER — Other Ambulatory Visit: Payer: Self-pay | Admitting: Internal Medicine

## 2017-12-24 MED FILL — CITALOPRAM HBR 20 MG TABLET: 20 | 90 days supply | Qty: 90 | Fill #0

## 2017-12-24 MED FILL — MONTELUKAST SOD 10 MG TAB: 10 | 90 days supply | Qty: 90 | Fill #0

## 2017-12-24 MED FILL — CYCLOBENZAPRINE HCL 10 MG T: 10 | 90 days supply | Qty: 90 | Fill #0

## 2017-12-24 MED FILL — LEVOTHYROXINE 75 MCG TABLET: 75 | 90 days supply | Qty: 90 | Fill #0

## 2017-12-24 MED FILL — buPROPion HCL 75 MG TABS: 75 | 45 days supply | Qty: 180 | Fill #0

## 2017-12-24 NOTE — Telephone Encounter (Signed)
Refill x 6 months 

## 2018-01-10 MED FILL — METOPROLOL SUCCINATE ER 25: 25 | 30 days supply | Qty: 30 | Fill #2

## 2018-02-12 DIAGNOSIS — Z23 Encounter for immunization: Secondary | ICD-10-CM | POA: Diagnosis not present

## 2018-02-21 ENCOUNTER — Encounter (HOSPITAL_COMMUNITY): Payer: Self-pay

## 2018-03-01 ENCOUNTER — Encounter: Payer: Self-pay | Admitting: Internal Medicine

## 2018-03-01 ENCOUNTER — Ambulatory Visit (INDEPENDENT_AMBULATORY_CARE_PROVIDER_SITE_OTHER): Payer: BLUE CROSS/BLUE SHIELD | Admitting: Internal Medicine

## 2018-03-01 VITALS — BP 160/108 | HR 88 | Temp 98.2°F | Ht 64.0 in | Wt 299.0 lb

## 2018-03-01 DIAGNOSIS — H8110 Benign paroxysmal vertigo, unspecified ear: Secondary | ICD-10-CM

## 2018-03-01 DIAGNOSIS — R002 Palpitations: Secondary | ICD-10-CM

## 2018-03-01 MED ORDER — DOXYCYCLINE HYCLATE 100 MG PO TABS: 100.0000 mg | ORAL_TABLET | Freq: Two times a day (BID) | ORAL | 0 refills | Status: DC

## 2018-03-01 MED ORDER — MECLIZINE HCL 25 MG PO TABS: 25.0000 mg | ORAL_TABLET | Freq: Three times a day (TID) | ORAL | 0 refills | Status: DC | PRN

## 2018-03-01 NOTE — Progress Notes (Signed)
   Subjective:    Patient ID: Mary Herman, female    DOB: 1961/01/01, 58 y.o.   MRN: [id number]  HPI 58 year old Female in today with complaint of dizziness and lightheadedness.  Also having some issues with elevated blood pressure.  In 2019 was placed on metoprolol XL 25 mg daily for complaint of palpitations.  EKG at that time showed mild sinus tachycardia with no acute changes despite complaining of fluttering in her chest.  She did not want to have 24-hour Holter monitor or see cardiologist at that time due to expense.  Does not consume alcohol or significant amounts of caffeine.  No chest pain or shortness of breath.  Has situational stress but now has full-time job.  Husband is disabled.  Previously worked for [employer] through a [employer].  History of hypothyroidism, gastric bypass surgery, depression, allergic rhinitis, vitamin D deficiency, history of chronic back pain.  Family history of stroke in mother who also had brain aneurysm 10 years prior to her death.  One brother with history of hypertension.      Review of Systems no nausea or vomiting.     Objective:   Physical Exam Skin warm and dry.  Nodes none.  Neck is supple without JVD thyromegaly or carotid bruits.  Chest clear to auscultation.  Cardiac exam regular rate and rhythm normal S1 and S2 without murmurs or gallops.  No focal deficits on brief neurological exam Blood pressure 160/108.  Pulse 88.  Temperature 98.2 degrees orally.  Pulse oximetry 97%.  Weight 299 pounds.  BMI is 51.32.      Assessment & Plan:  Benign positional vertigo-no nystagmus demonstrated but based on history this is likely diagnosis  History of fluttering in chest-history of palpitations treated with metoprolol  Elevated blood pressure reading  Situational stress  Plan: She has upcoming physical examination in February.  Treat with meclizine 25 mg 3 times a day as needed for dizziness.  Follow-up with physical exam in February.   Continue metoprolol for palpitations.  Call if blood pressure remains elevated.  25 minutes spent with patient with 50% of time spent with counseling and discussion regarding symptoms and treatment

## 2018-03-21 ENCOUNTER — Other Ambulatory Visit: Payer: Self-pay | Admitting: Internal Medicine

## 2018-03-22 ENCOUNTER — Other Ambulatory Visit: Payer: Self-pay

## 2018-03-22 MED ORDER — DIAZEPAM 5 MG PO TABS: 5.0000 mg | ORAL_TABLET | Freq: Every evening | ORAL | 1 refills | Status: DC | PRN

## 2018-03-22 MED ORDER — CELECOXIB 200 MG PO CAPS: 200.0000 mg | ORAL_CAPSULE | Freq: Two times a day (BID) | ORAL | 1 refills | Status: DC

## 2018-04-13 NOTE — Patient Instructions (Signed)
Take meclizine 3 times a day as needed for vertigo.  Continue to monitor blood pressure and call if persistently elevated.  Continue metoprolol for palpitations.  Physical exam booked for February 2020.

## 2018-04-14 ENCOUNTER — Encounter: Payer: Self-pay | Admitting: Internal Medicine

## 2018-04-14 ENCOUNTER — Ambulatory Visit (INDEPENDENT_AMBULATORY_CARE_PROVIDER_SITE_OTHER): Payer: BLUE CROSS/BLUE SHIELD | Admitting: Internal Medicine

## 2018-04-14 VITALS — BP 120/90 | HR 80 | Ht 64.0 in | Wt 300.0 lb

## 2018-04-14 DIAGNOSIS — M179 Osteoarthritis of knee, unspecified: Secondary | ICD-10-CM

## 2018-04-14 DIAGNOSIS — E785 Hyperlipidemia, unspecified: Secondary | ICD-10-CM

## 2018-04-14 DIAGNOSIS — Z8639 Personal history of other endocrine, nutritional and metabolic disease: Secondary | ICD-10-CM

## 2018-04-14 DIAGNOSIS — R7302 Impaired glucose tolerance (oral): Secondary | ICD-10-CM | POA: Diagnosis not present

## 2018-04-14 DIAGNOSIS — E559 Vitamin D deficiency, unspecified: Secondary | ICD-10-CM

## 2018-04-14 DIAGNOSIS — M1710 Unilateral primary osteoarthritis, unspecified knee: Secondary | ICD-10-CM

## 2018-04-14 DIAGNOSIS — Z0000 Encounter for general adult medical examination without abnormal findings: Secondary | ICD-10-CM

## 2018-04-14 DIAGNOSIS — R8290 Unspecified abnormal findings in urine: Secondary | ICD-10-CM

## 2018-04-14 DIAGNOSIS — F329 Major depressive disorder, single episode, unspecified: Secondary | ICD-10-CM

## 2018-04-14 DIAGNOSIS — F439 Reaction to severe stress, unspecified: Secondary | ICD-10-CM

## 2018-04-14 DIAGNOSIS — E039 Hypothyroidism, unspecified: Secondary | ICD-10-CM | POA: Diagnosis not present

## 2018-04-14 DIAGNOSIS — M069 Rheumatoid arthritis, unspecified: Secondary | ICD-10-CM | POA: Diagnosis not present

## 2018-04-14 DIAGNOSIS — F419 Anxiety disorder, unspecified: Secondary | ICD-10-CM

## 2018-04-14 DIAGNOSIS — Z9884 Bariatric surgery status: Secondary | ICD-10-CM

## 2018-04-14 DIAGNOSIS — Z8739 Personal history of other diseases of the musculoskeletal system and connective tissue: Secondary | ICD-10-CM

## 2018-04-14 LAB — POCT URINALYSIS DIPSTICK
Bilirubin, UA: NEGATIVE
Glucose, UA: NEGATIVE
Ketones, UA: NEGATIVE
Nitrite, UA: NEGATIVE
Protein, UA: POSITIVE — AB
Spec Grav, UA: 1.01 (ref 1.010–1.025)
Urobilinogen, UA: 0.2 E.U./dL
pH, UA: 7.5 (ref 5.0–8.0)

## 2018-04-14 MED ORDER — HYDROCODONE-ACETAMINOPHEN 10-325 MG PO TABS: 1.0000 | ORAL_TABLET | Freq: Four times a day (QID) | ORAL | 0 refills | Status: DC | PRN

## 2018-04-14 NOTE — Progress Notes (Signed)
   Subjective:    Patient ID: Mary Herman, female    DOB: 08-17-1960, 58 y.o.   MRN: [id number]  HPI 58 year old Female in today for health maintenance exam and evaluation of medical issues.  She has multiple medical problems.  Until recently she did not have health insurance.  Was able to secure a full-time job at [employer].  History of rheumatoid arthritis treated at [facility].  History of hypothyroidism, gastric bypass surgery, depression, allergic rhinitis, chronic back pain and vitamin D deficiency.  She is allergic to cats and has cats in her home.  Gastric bypass surgery 2009 by Dr. Hassell Done.  History of interstitial cystitis of the remote past.  History of TMJ syndrome.  Had arthroscopic surgery left knee over 25 years ago.  Status post cholecystectomy.  Had C-section 1998.  Codeine causes a rash.  Social history: She is married.  Husband is disabled.  Has a high school education.  Does not smoke or consume alcohol.  She has 1 daughter who is had a number of operations on her femur.  Patient has had considerable stress and financial issues of the past few years.  At one point had to file bankruptcy.  Family history: Father died at age 32 with history of alcoholism the cause of death not known to patient.  Mother with history of brain aneurysm 10 years prior to her death.  Mother died at age 70 with history of stroke and had lung disease.  No sisters.  One brother age 7 with history of hypertension.  One brother in his 25s whose health status she does not know about.    Review of Systems chronic musculoskeletal joint and back pain.  Anxiety and depression.  Situational stress.     Objective:   Physical Exam Vitals signs reviewed.  Constitutional:      Appearance: Normal appearance.  Neurological:     Mental Status: She is alert.           Assessment & Plan:  Pure hypercholesterolemia-total cholesterol 232 with LDL cholesterol 129  Impaired  glucose tolerance-hemoglobin A1c 5.7%  Vitamin D deficiency-level is 20 treatment high-dose vitamin D  Fatigue  Hypothyroidism-TSH is normal on current dose of thyroid replacement  Anxiety and depression-is on Wellbutrin and Celexa.  Takes Valium at bedtime if needed.  Chronic back and joint pain.  History of rheumatoid arthritis.  Celebrex refill.  Also takes tramadol.  Takes as needed Flexeril.  BMI-BMI 51.49  History of labile blood pressure-stable at 120/90-continue to monitor  History of asthma treated with Singulair  Plan: Patient should strongly consider Dr. Migdalia Dk clinic for weight loss management.  This would help her many medical issues such as hyperlipidemia and labile hypertension.  Follow-up in 6 months.

## 2018-04-15 LAB — COMPLETE METABOLIC PANEL WITH GFR
AG RATIO: 1.6 (calc) (ref 1.0–2.5)
ALT: 17 U/L (ref 6–29)
AST: 20 U/L (ref 10–35)
Albumin: 4 g/dL (ref 3.6–5.1)
Alkaline phosphatase (APISO): 114 U/L (ref 37–153)
BUN: 9 mg/dL (ref 7–25)
CALCIUM: 8.9 mg/dL (ref 8.6–10.4)
CO2: 27 mmol/L (ref 20–32)
CREATININE: 0.82 mg/dL (ref 0.50–1.05)
Chloride: 104 mmol/L (ref 98–110)
GFR, EST AFRICAN AMERICAN: 93 mL/min/{1.73_m2} (ref >=60)
GFR, EST NON AFRICAN AMERICAN: 81 mL/min/{1.73_m2} (ref >=60)
GLOBULIN: 2.5 g/dL (ref 1.9–3.7)
Glucose, Bld: 111 mg/dL — ABNORMAL HIGH (ref 65–99)
Potassium: 5.1 mmol/L (ref 3.5–5.3)
SODIUM: 139 mmol/L (ref 135–146)
TOTAL PROTEIN: 6.5 g/dL (ref 6.1–8.1)
Total Bilirubin: 1 mg/dL (ref 0.2–1.2)

## 2018-04-15 LAB — LIPID PANEL
CHOL/HDL RATIO: 2.9 (calc) (ref <5.0)
Cholesterol: 232 mg/dL — ABNORMAL HIGH (ref <200)
HDL: 81 mg/dL (ref >=50)
LDL Cholesterol (Calc): 129 mg/dL (calc) — ABNORMAL HIGH
NON-HDL CHOLESTEROL (CALC): 151 mg/dL — AB (ref <130)
Triglycerides: 114 mg/dL (ref <150)

## 2018-04-15 LAB — HEMOGLOBIN A1C
Hgb A1c MFr Bld: 5.7 % of total Hgb — ABNORMAL HIGH (ref <5.7)
MEAN PLASMA GLUCOSE: 117 (calc)
eAG (mmol/L): 6.5 (calc)

## 2018-04-15 LAB — URINE CULTURE
MICRO NUMBER:: 221117
SPECIMEN QUALITY:: ADEQUATE

## 2018-04-15 LAB — CBC WITH DIFFERENTIAL/PLATELET
ABSOLUTE MONOCYTES: 276 {cells}/uL (ref 200–950)
Basophils Absolute: 18 cells/uL (ref 0–200)
Basophils Relative: 0.4 %
Eosinophils Absolute: 156 cells/uL (ref 15–500)
Eosinophils Relative: 3.4 %
HEMATOCRIT: 45.2 % — AB (ref 35.0–45.0)
Hemoglobin: 14.6 g/dL (ref 11.7–15.5)
Lymphs Abs: 1366 cells/uL (ref 850–3900)
MCH: 28.2 pg (ref 27.0–33.0)
MCHC: 32.3 g/dL (ref 32.0–36.0)
MCV: 87.3 fL (ref 80.0–100.0)
MPV: 10.5 fL (ref 7.5–12.5)
Monocytes Relative: 6 %
NEUTROS PCT: 60.5 %
Neutro Abs: 2783 cells/uL (ref 1500–7800)
Platelets: 272 10*3/uL (ref 140–400)
RBC: 5.18 10*6/uL — AB (ref 3.80–5.10)
RDW: 13.2 % (ref 11.0–15.0)
TOTAL LYMPHOCYTE: 29.7 %
WBC: 4.6 10*3/uL (ref 3.8–10.8)

## 2018-04-15 LAB — TSH: TSH: 1.78 mIU/L

## 2018-04-15 LAB — VITAMIN D 25 HYDROXY (VIT D DEFICIENCY, FRACTURES): Vit D, 25-Hydroxy: 20 ng/mL — ABNORMAL LOW (ref 30–100)

## 2018-04-20 ENCOUNTER — Other Ambulatory Visit: Payer: Self-pay | Admitting: Internal Medicine

## 2018-04-25 ENCOUNTER — Encounter: Payer: Self-pay | Admitting: Internal Medicine

## 2018-04-25 ENCOUNTER — Telehealth: Payer: Self-pay | Admitting: Internal Medicine

## 2018-04-25 NOTE — Telephone Encounter (Signed)
Basically she needs to take 2000 units D3 daily and waych diet. We could start her on cholesterol med but I prefer to re-check  in 6 months.Thyroid is OK. Please call her

## 2018-04-25 NOTE — Telephone Encounter (Signed)
Patient was notified.

## 2018-05-11 DIAGNOSIS — R5382 Chronic fatigue, unspecified: Secondary | ICD-10-CM | POA: Diagnosis not present

## 2018-05-11 DIAGNOSIS — M2550 Pain in unspecified joint: Secondary | ICD-10-CM | POA: Diagnosis not present

## 2018-05-11 DIAGNOSIS — M0579 Rheumatoid arthritis with rheumatoid factor of multiple sites without organ or systems involvement: Secondary | ICD-10-CM | POA: Diagnosis not present

## 2018-06-10 ENCOUNTER — Other Ambulatory Visit: Payer: Self-pay

## 2018-06-10 MED ORDER — LEVOTHYROXINE SODIUM 75 MCG PO TABS: 75.0000 ug | ORAL_TABLET | Freq: Every day | ORAL | 1 refills | Status: DC

## 2018-06-10 MED ORDER — CYCLOBENZAPRINE HCL 10 MG PO TABS: 10.0000 mg | ORAL_TABLET | Freq: Every day | ORAL | 1 refills | Status: DC

## 2018-06-10 MED ORDER — CITALOPRAM HYDROBROMIDE 20 MG PO TABS: 20.0000 mg | ORAL_TABLET | Freq: Every morning | ORAL | 1 refills | Status: DC

## 2018-06-10 MED ORDER — MONTELUKAST SODIUM 10 MG PO TABS: 10.0000 mg | ORAL_TABLET | Freq: Every day | ORAL | 1 refills | Status: DC

## 2018-06-13 ENCOUNTER — Other Ambulatory Visit: Payer: Self-pay | Admitting: Internal Medicine

## 2018-06-13 MED ORDER — MONTELUKAST SODIUM 10 MG PO TABS: 10.0000 mg | ORAL_TABLET | Freq: Every day | ORAL | 1 refills | Status: DC

## 2018-06-13 NOTE — Telephone Encounter (Signed)
Received Fax RX request from  [place]. [address] [place]  Medication -  Doxycycline Hyclate 100 mg tab    Last Refill 1.7.20  2nd Medication montelukast (SINGULAIR) 10 MG tablet   Last refill 1.28.20  Last OV - 2.20.20  Last CPE -2.20.20

## 2018-06-13 NOTE — Telephone Encounter (Signed)
Use 5 mg 2 tabs daily

## 2018-06-13 NOTE — Telephone Encounter (Signed)
Called patient to see why she was requesting a refill for doxycycline and she said she clicked on that medication by accident and she does not need it.

## 2018-06-15 ENCOUNTER — Other Ambulatory Visit: Payer: Self-pay | Admitting: Internal Medicine

## 2018-07-25 ENCOUNTER — Other Ambulatory Visit: Payer: Self-pay

## 2018-07-25 DIAGNOSIS — R5382 Chronic fatigue, unspecified: Secondary | ICD-10-CM | POA: Diagnosis not present

## 2018-07-25 DIAGNOSIS — M2550 Pain in unspecified joint: Secondary | ICD-10-CM | POA: Diagnosis not present

## 2018-07-25 DIAGNOSIS — M0579 Rheumatoid arthritis with rheumatoid factor of multiple sites without organ or systems involvement: Secondary | ICD-10-CM | POA: Diagnosis not present

## 2018-07-25 MED ORDER — MONTELUKAST SODIUM 10 MG PO TABS: 10.0000 mg | ORAL_TABLET | Freq: Every day | ORAL | 3 refills | Status: DC

## 2018-07-26 ENCOUNTER — Ambulatory Visit (INDEPENDENT_AMBULATORY_CARE_PROVIDER_SITE_OTHER): Payer: BLUE CROSS/BLUE SHIELD | Admitting: Orthopaedic Surgery

## 2018-07-26 ENCOUNTER — Encounter: Payer: Self-pay | Admitting: Orthopaedic Surgery

## 2018-07-26 ENCOUNTER — Other Ambulatory Visit: Payer: Self-pay

## 2018-07-26 DIAGNOSIS — M1711 Unilateral primary osteoarthritis, right knee: Secondary | ICD-10-CM

## 2018-07-26 DIAGNOSIS — M1712 Unilateral primary osteoarthritis, left knee: Secondary | ICD-10-CM

## 2018-07-26 MED ORDER — LIDOCAINE HCL 1 % IJ SOLN
0.5000 mL | INTRAMUSCULAR | Status: AC | PRN
  Administered 2018-07-26: .5 mL

## 2018-07-26 MED ORDER — METHYLPREDNISOLONE ACETATE 40 MG/ML IJ SUSP
40.0000 mg | INTRAMUSCULAR | Status: AC | PRN
  Administered 2018-07-26: 40 mg via INTRA_ARTICULAR

## 2018-07-26 NOTE — Progress Notes (Signed)
   Procedure Note  Patient: Mary Herman             Date of Birth: 11-Mar-1960           MRN: [id number]             Visit Date: 07/26/2018  HPI: Mary Herman returns today with bilateral knee pain requesting injections both knees.  She has known osteoarthritis of both knees.  She has had no new injury.  She does take Celebrex 200 mg twice daily.  States Celebrex does help some with pain in both knees.  Physical exam: Bilateral knees no effusion or abnormal warmth or erythema.  Good range of motion of both knees.  Procedures: Visit Diagnoses: Unilateral primary osteoarthritis, left knee  Unilateral primary osteoarthritis, right knee  Large Joint Inj: bilateral knee on 07/26/2018 8:44 AM Indications: pain Details: 22 G 1.5 in needle, anterolateral approach  Arthrogram: No  Medications (Right): 0.5 mL lidocaine 1 %; 40 mg methylPREDNISolone acetate 40 MG/ML Medications (Left): 0.5 mL lidocaine 1 %; 40 mg methylPREDNISolone acetate 40 MG/ML Outcome: tolerated well, no immediate complications Procedure, treatment alternatives, risks and benefits explained, specific risks discussed. Consent was given by the patient. Immediately prior to procedure a time out was called to verify the correct patient, procedure, equipment, support staff and site/side marked as required. Patient was prepped and draped in the usual sterile fashion.    Plan: She will follow-up on an as-needed basis.  She understands that she needs to wait least 3 months injections in the knees.  Questions encouraged and answered.

## 2018-09-01 ENCOUNTER — Other Ambulatory Visit: Payer: Self-pay | Admitting: Internal Medicine

## 2018-09-01 MED ORDER — CELECOXIB 200 MG PO CAPS: 200.0000 mg | ORAL_CAPSULE | Freq: Two times a day (BID) | ORAL | 1 refills | Status: DC

## 2018-09-01 NOTE — Telephone Encounter (Signed)
Received Fax RX request from  [place]  Medication - celecoxib (CELEBREX) 200 MG capsule    Last Refill -06-09-18  Last OV - 04-14-18  Last CPE -04-14-18

## 2018-10-03 ENCOUNTER — Telehealth: Payer: Self-pay | Admitting: Internal Medicine

## 2018-10-03 NOTE — Telephone Encounter (Signed)
Received Fax RX request from  [place]. [address] [place]  Medication - diazepam (VALIUM) 5 MG tablet   Last Refill - 06/22/18  Last OV - 04/14/18  Last CPE - 04/14/18

## 2018-10-10 ENCOUNTER — Other Ambulatory Visit: Payer: Self-pay

## 2018-10-11 ENCOUNTER — Encounter: Payer: Self-pay | Admitting: Gynecology

## 2018-10-11 ENCOUNTER — Ambulatory Visit (INDEPENDENT_AMBULATORY_CARE_PROVIDER_SITE_OTHER): Payer: BC Managed Care – PPO | Admitting: Gynecology

## 2018-10-11 ENCOUNTER — Ambulatory Visit: Payer: BC Managed Care – PPO | Admitting: Internal Medicine

## 2018-10-11 ENCOUNTER — Encounter: Payer: Self-pay | Admitting: Internal Medicine

## 2018-10-11 VITALS — BP 140/90 | HR 106 | Temp 98.2°F | Ht 64.0 in | Wt 300.0 lb

## 2018-10-11 VITALS — BP 124/84 | Ht 63.0 in | Wt 313.0 lb

## 2018-10-11 DIAGNOSIS — F439 Reaction to severe stress, unspecified: Secondary | ICD-10-CM | POA: Diagnosis not present

## 2018-10-11 DIAGNOSIS — N952 Postmenopausal atrophic vaginitis: Secondary | ICD-10-CM | POA: Diagnosis not present

## 2018-10-11 DIAGNOSIS — M170 Bilateral primary osteoarthritis of knee: Secondary | ICD-10-CM

## 2018-10-11 DIAGNOSIS — Z23 Encounter for immunization: Secondary | ICD-10-CM

## 2018-10-11 DIAGNOSIS — Z01419 Encounter for gynecological examination (general) (routine) without abnormal findings: Secondary | ICD-10-CM

## 2018-10-11 DIAGNOSIS — R7302 Impaired glucose tolerance (oral): Secondary | ICD-10-CM | POA: Diagnosis not present

## 2018-10-11 DIAGNOSIS — E785 Hyperlipidemia, unspecified: Secondary | ICD-10-CM | POA: Diagnosis not present

## 2018-10-11 DIAGNOSIS — H8110 Benign paroxysmal vertigo, unspecified ear: Secondary | ICD-10-CM

## 2018-10-11 DIAGNOSIS — N950 Postmenopausal bleeding: Secondary | ICD-10-CM

## 2018-10-11 DIAGNOSIS — E559 Vitamin D deficiency, unspecified: Secondary | ICD-10-CM

## 2018-10-11 DIAGNOSIS — E039 Hypothyroidism, unspecified: Secondary | ICD-10-CM | POA: Diagnosis not present

## 2018-10-11 DIAGNOSIS — R002 Palpitations: Secondary | ICD-10-CM

## 2018-10-11 DIAGNOSIS — F329 Major depressive disorder, single episode, unspecified: Secondary | ICD-10-CM

## 2018-10-11 DIAGNOSIS — M1710 Unilateral primary osteoarthritis, unspecified knee: Secondary | ICD-10-CM

## 2018-10-11 DIAGNOSIS — N840 Polyp of corpus uteri: Secondary | ICD-10-CM

## 2018-10-11 DIAGNOSIS — Z8739 Personal history of other diseases of the musculoskeletal system and connective tissue: Secondary | ICD-10-CM

## 2018-10-11 DIAGNOSIS — M069 Rheumatoid arthritis, unspecified: Secondary | ICD-10-CM

## 2018-10-11 DIAGNOSIS — M179 Osteoarthritis of knee, unspecified: Secondary | ICD-10-CM

## 2018-10-11 DIAGNOSIS — Z9884 Bariatric surgery status: Secondary | ICD-10-CM

## 2018-10-11 DIAGNOSIS — F419 Anxiety disorder, unspecified: Secondary | ICD-10-CM

## 2018-10-11 DIAGNOSIS — M059 Rheumatoid arthritis with rheumatoid factor, unspecified: Secondary | ICD-10-CM

## 2018-10-11 NOTE — Progress Notes (Signed)
   Subjective:    Patient ID: Mary Herman, female    DOB: 1960/11/01, 58 y.o.   MRN: [id number]  HPI 58 year old Female for 6 month recheck. Hx Vitamin D deficiency, hyperlipidemia, hypothyroidism, history of palpitations and situational stress.  Takes metoprolol for palpitations.  History of gastric bypass surgery but has become obese afterwards.  Has chronic back pain and vitamin D deficiency.  Fortunately now has health insurance through her job at [employer].  She is working from home and is working out well for her.  History of rheumatoid arthritis treated by [facility].  Seems less stressed than in the past. Is on Celexa and Wellbutrin for depression.  Takes Enbrel for rheumatoid arthritis.  Takes Singulair for allergic rhinitis.  Also takes Celebrex for joint pain  She saw Dr.Blackman regarding bilateral knee pain.  History of osteoarthritis of both knees and is on Celebrex.  Both knees were injected with lidocaine and methylprednisolone.  History of arthroscopic surgery left knee well over 25 years ago. Review of Systems     Objective:   Physical Exam Neck supple.  No thyromegaly.  Chest clear to auscultation.  Cardiac exam regular rate and rhythm normal S1 and S2.  Extremities without edema. BMI of 51.49.  Blood pressure 140/90, pulse 106, weight 300 pounds.      Assessment & Plan:  Morbid obesity-could benefit from Dr. Migdalia Dk clinic.  We will give her information on this.  BMI 51.49  Depression-stable on Celexa and Wellbutrin  Osteoarthritis of knees treated by Dr. Ninfa Linden with injections and she also takes Celebrex  Rheumatoid arthritis treated by [facility] and is on Enbrel  Health maintenance-flu vaccine given today  Hypothyroidism-continue thyroid replacement.  TSH drawn today.  History of vertigo treated with as needed Antivert  History of impaired glucose tolerance-hemoglobin A1c drawn today.   Hyperlipidemia-currently not on statin therapy.  6 months ago total cholesterol was 231 with an LDL cholesterol of 129.  Triglycerides are 114 and HDL cholesterol 81.  She may be a candidate for Zetia if she is worried about myalgias on statin medication.  Allergic rhinitis treated with Singulair  Plan: Physical exam due in 6 months with fasting labs.  Further instructions to follow once lab results are back.  30 minutes spent with patient including medical decision making and reviewing medical records

## 2018-10-11 NOTE — Patient Instructions (Addendum)
Office will call to arrange for surgery.  Schedule your mammogram  Schedule your colonoscopy

## 2018-10-11 NOTE — Patient Instructions (Addendum)
Continue to work on diet and exercise..  Follow-up in 6 months.  Labs drawn and pending with further instructions to follow.  No change in medications.  Flu vaccine given.

## 2018-10-11 NOTE — Progress Notes (Signed)
    Mary Herman 05/08/1960 [id number]        58 y.o.  G1P1001 for annual gynecologic exam.  Has continued to bleed off and on for this past year.  History of work-up last year showing endometrial polyp and benign endometrial biopsy.  Patient never followed up for hysteroscopy D&C as discussed.  Past medical history,surgical history, problem list, medications, allergies, family history and social history were all reviewed and documented as reviewed in the EPIC chart.  ROS:  Performed with pertinent positives and negatives included in the history, assessment and plan.   Additional significant findings : None   Exam: [name] assistant Vitals:   10/11/18 1143  BP: 124/84  Weight: (!) 313 lb (142 kg)  Height: 5\' 3"  (1.6 m)   Body mass index is 55.45 kg/m.  General appearance:  Normal affect, orientation and appearance. Skin: Grossly normal HEENT: Without gross lesions.  No cervical or supraclavicular adenopathy. Thyroid normal.  Lungs:  Clear without wheezing, rales or rhonchi Cardiac: RR, without RMG Abdominal:  Soft, nontender, without masses, guarding, rebound, organomegaly or hernia Breasts:  Examined lying and sitting without masses, retractions, discharge or axillary adenopathy. Pelvic:  Ext, BUS, Vagina: With atrophic changes  Cervix: With atrophic changes.  High in the vaginal vault  Uterus: Unable to palpate.  No gross masses or tenderness  Adnexa: Without masses or tenderness    Anus and perineum: Normal   Rectovaginal: Normal sphincter tone without palpated masses or tenderness.    Assessment/Plan:  58 y.o. G28P1001 female for annual gynecologic exam.   1. Postmenopausal bleeding.  Patient was evaluated last year with postmenopausal bleeding.  Sonohysterogram showed a 15 x 4 mm endometrial defect consistent with polyp.  Endometrial biopsy showed polypoid atrophic endometrium.  She was to schedule a hysteroscopy D&C but failed to follow-up for this.  She is  continued to bleed on and off since then.  Exam today overall is normal limited by abdominal girth.  I again reviewed the possibilities to include hyperplastic premalignant polyp and early cancer.  Options to restudy now with sonohysterogram and rebiopsy versus proceeding with hysteroscopy D&C discussed.  Given she has persisted with bleeding recommend proceeding with hysteroscopy D&C.  I discussed with involved with the procedure to include the expected intraoperative and postoperative courses.  Risks to include infection, hemorrhage necessitating transfusion, damage to internal structures including vagina cervix uterus with perforation and damage to internal organs including bowel bladder ureters vessels and nerves necessitating major exploratory reparative surgeries were reviewed with her.  She wants to go ahead and schedule this and we will move towards scheduling her surgery. 2. Mammography 2017.  Recommended screening mammography now and she agrees to call and schedule.  Breast exam normal today. 3. Pap smear 2019 normal.  No Pap smear done today.  No history of significant abnormal Pap smears.  Plan repeat Pap smear at 3-year interval per current screening guidelines. 4. Colonoscopy never.  Recommended patient schedule a screening colonoscopy and she agrees to do so. 5. DEXA 2015 normal.  Recommend repeat DEXA at age 42. 40. Health maintenance.  No routine lab work done as patient does this elsewhere.  Follow-up for hysteroscopy D&C preop.  Follow-up in 1 year for annual exam.   Anastasio Auerbach MD, 12:09 PM 10/11/2018

## 2018-10-12 ENCOUNTER — Telehealth: Payer: Self-pay

## 2018-10-12 ENCOUNTER — Other Ambulatory Visit: Payer: Self-pay

## 2018-10-12 LAB — TSH: TSH: 1.32 mIU/L

## 2018-10-12 LAB — LIPID PANEL
Cholesterol: 229 mg/dL — ABNORMAL HIGH (ref <200)
HDL: 85 mg/dL (ref >=50)
LDL Cholesterol (Calc): 124 mg/dL (calc) — ABNORMAL HIGH
Non-HDL Cholesterol (Calc): 144 mg/dL (calc) — ABNORMAL HIGH (ref <130)
Total CHOL/HDL Ratio: 2.7 (calc) (ref <5.0)
Triglycerides: 92 mg/dL (ref <150)

## 2018-10-12 LAB — HEMOGLOBIN A1C
Hgb A1c MFr Bld: 5.8 % of total Hgb — ABNORMAL HIGH (ref <5.7)
Mean Plasma Glucose: 120 (calc)
eAG (mmol/L): 6.6 (calc)

## 2018-10-12 MED ORDER — ROSUVASTATIN CALCIUM 5 MG PO TABS: ORAL_TABLET | ORAL | 0 refills | Status: DC

## 2018-10-12 NOTE — Telephone Encounter (Signed)
I called and read told patient that Dr. Loetta Rough wanted me to make sure that she understood that he is doing the surgery to rule out cancer. She voiced understanding.

## 2018-10-12 NOTE — Telephone Encounter (Signed)
I called patient to discuss scheduling surgery. I reviewed her insurance benefits with her and her estimated surgery prepymt. She needs time to make financial arrangements and talk with husband.  May have to postpone for a little while.  Will call me when ready to schedule.

## 2018-10-12 NOTE — Telephone Encounter (Signed)
I just want to make sure the patient understands that we are doing the procedure to rule out cancer

## 2018-10-13 ENCOUNTER — Other Ambulatory Visit: Payer: Self-pay

## 2018-10-13 ENCOUNTER — Encounter: Payer: Self-pay | Admitting: Gynecology

## 2018-10-13 MED ORDER — MISOPROSTOL 100 MCG PO TABS: ORAL_TABLET | ORAL | 0 refills | Status: DC

## 2018-10-13 NOTE — Telephone Encounter (Signed)
I called patient this morning and left message to call me as I received Oct schedule and can confirm Oct 2 with her.

## 2018-10-13 NOTE — Telephone Encounter (Signed)
Patient called back stating she talked with husband and they want to go ahead and schedule surgery.  We talked about dates and tentatively scheduled her for 10/2 as she is off that day. She is scheduled at [place] because of BMI 55.45.  We scheduled Covid screen accordingly. I will confirm with her when I get Oct schedule.

## 2018-10-13 NOTE — Progress Notes (Signed)
cyc

## 2018-10-13 NOTE — Telephone Encounter (Signed)
Patient called back and I confirmed Oct 2 at 7:30am at [facility].  Covid test has been scheduled and patient was advised to quarantine from time of test until surgery except for medical visits/emergency.  I advised patient regarding need for Cytotec intravaginally hs night before surgery. I warned her she might see some spotting/light bleeding and/or cramping but no worries just means medication is working.  Rx sent.  Pre op appt scheduled.

## 2018-10-25 DIAGNOSIS — M2550 Pain in unspecified joint: Secondary | ICD-10-CM | POA: Diagnosis not present

## 2018-10-25 DIAGNOSIS — M0579 Rheumatoid arthritis with rheumatoid factor of multiple sites without organ or systems involvement: Secondary | ICD-10-CM | POA: Diagnosis not present

## 2018-10-25 DIAGNOSIS — R5382 Chronic fatigue, unspecified: Secondary | ICD-10-CM | POA: Diagnosis not present

## 2018-10-28 ENCOUNTER — Telehealth: Payer: Self-pay | Admitting: *Deleted

## 2018-10-28 MED ORDER — TRAMADOL HCL 50 MG PO TABS: 50.0000 mg | ORAL_TABLET | Freq: Three times a day (TID) | ORAL | 0 refills | Status: DC | PRN

## 2018-10-28 NOTE — Telephone Encounter (Signed)
Please advise 

## 2018-10-28 NOTE — Telephone Encounter (Signed)
I sent in some tramadol.  It went to the print first, but then I was able to do a new one.

## 2018-10-28 NOTE — Telephone Encounter (Signed)
Pt called left vm stating she has fallen and had 2 knee replacements and now she is in pain and wants to know if she can have something for pain. States she doesn't think she can wait until Tues for an appointment since we are closed Monday.   CB [phone]

## 2018-11-03 DIAGNOSIS — M0579 Rheumatoid arthritis with rheumatoid factor of multiple sites without organ or systems involvement: Secondary | ICD-10-CM | POA: Diagnosis not present

## 2018-11-09 ENCOUNTER — Ambulatory Visit: Payer: Self-pay

## 2018-11-09 ENCOUNTER — Encounter: Payer: Self-pay | Admitting: Orthopaedic Surgery

## 2018-11-09 ENCOUNTER — Other Ambulatory Visit: Payer: Self-pay

## 2018-11-09 ENCOUNTER — Ambulatory Visit (INDEPENDENT_AMBULATORY_CARE_PROVIDER_SITE_OTHER): Payer: BC Managed Care – PPO | Admitting: Orthopaedic Surgery

## 2018-11-09 DIAGNOSIS — M1712 Unilateral primary osteoarthritis, left knee: Secondary | ICD-10-CM

## 2018-11-09 DIAGNOSIS — M25562 Pain in left knee: Secondary | ICD-10-CM

## 2018-11-09 DIAGNOSIS — M1711 Unilateral primary osteoarthritis, right knee: Secondary | ICD-10-CM

## 2018-11-09 MED ORDER — LIDOCAINE HCL 1 % IJ SOLN
3.0000 mL | INTRAMUSCULAR | Status: AC | PRN
  Administered 2018-11-09: 3 mL

## 2018-11-09 MED ORDER — METHYLPREDNISOLONE ACETATE 40 MG/ML IJ SUSP
40.0000 mg | INTRAMUSCULAR | Status: AC | PRN
  Administered 2018-11-09: 40 mg via INTRA_ARTICULAR

## 2018-11-09 MED ORDER — LIDOCAINE HCL 1 % IJ SOLN
3.0000 mL | INTRAMUSCULAR | Status: AC | PRN
  Administered 2018-11-09: 16:00:00 3 mL

## 2018-11-09 NOTE — Progress Notes (Signed)
Office Visit Note   Patient: Mary Herman           Date of Birth: October 11, 1960           MRN: [id number] Visit Date: 11/09/2018              Requested by: Elby Showers, MD [address] [place],  [place] [address] PCP: Elby Showers, MD   Assessment & Plan: Visit Diagnoses:  1. Acute pain of left knee   2. Unilateral primary osteoarthritis, left knee   3. Unilateral primary osteoarthritis, right knee     Plan: I gave her reassurance that I see no fractures however I do know that the severity of arthritis in the worsened and her pain heightened with any type of mechanical fall and impact like she had.  I do feel it was worth providing injections in both her knees with a steroid today and she agrees and tolerated them well.  She will continue work on knee strengthening as well as quad strengthening exercises.  My only other option for her would be knee replacement surgery.  All question concerns were answered and addressed.  Follow-up is as needed.  When she does eventually come in again for her knees I would like to have a weight done and a BMI recorded.  Follow-Up Instructions: Return if symptoms worsen or fail to improve.   Orders:  Orders Placed This Encounter  Procedures  . Large Joint Inj  . Large Joint Inj  . XR KNEE 3 VIEW LEFT   No orders of the defined types were placed in this encounter.     Procedures: Large Joint Inj: R knee on 11/09/2018 4:20 PM Indications: diagnostic evaluation and pain Details: 22 G 1.5 in needle, superolateral approach  Arthrogram: No  Medications: 3 mL lidocaine 1 %; 40 mg methylPREDNISolone acetate 40 MG/ML Outcome: tolerated well, no immediate complications Procedure, treatment alternatives, risks and benefits explained, specific risks discussed. Consent was given by the patient. Immediately prior to procedure a time out was called to verify the correct patient, procedure, equipment, support staff and site/side marked as  required. Patient was prepped and draped in the usual sterile fashion.   Large Joint Inj: L knee on 11/09/2018 4:20 PM Indications: diagnostic evaluation and pain Details: 22 G 1.5 in needle, superolateral approach  Arthrogram: No  Medications: 3 mL lidocaine 1 %; 40 mg methylPREDNISolone acetate 40 MG/ML Outcome: tolerated well, no immediate complications Procedure, treatment alternatives, risks and benefits explained, specific risks discussed. Consent was given by the patient. Immediately prior to procedure a time out was called to verify the correct patient, procedure, equipment, support staff and site/side marked as required. Patient was prepped and draped in the usual sterile fashion.       Clinical Data: No additional findings.   Subjective: Chief Complaint  Patient presents with  . Left Knee - Pain  . Right Knee - Pain  The patient is very well-known to me.  She is a pleasant 58 year old female with known end-stage arthritis of both her knees.  We last injectioned both knees about 3 months ago.  She is in need of knee replacement surgery but is trying to lose weight.  She had an acute fall racing on her left knee with a significant flareup of left knee pain.  Hurts a lot in the back of the knee on that knee since she did fall.  She did would like to have injections in both knees today  but we felt was appropriate to x-ray her left knee in light of the recent fall and the increased severity of her left knee pain.  She denies any other acute changes in her medical status  HPI  Review of Systems .  She currently denies any headache, chest pain, shortness of breath, fever, chills, nausea, vomiting  Objective: Vital Signs: LMP 05/25/2014   Physical Exam She is alert and oriented x3 and in no acute distress Ortho Exam Examination of her left knee shows varus malalignment.  There is global tenderness but no significant swelling or bruising.  Both knees are stable with good range  of motion but definitely very painful to her. Specialty Comments:  No specialty comments available.  Imaging: Xr Knee 3 View Left  Result Date: 11/09/2018 3 views of the left knee show severe end-stage arthritis.  There is significant varus malalignment.  There is complete loss of medial joint space.  There are periarticular osteophytes throughout the knee.  There are no acute changes as it relates to any fractures that are seen when compared to previous films in light of the patient's recent fall.    PMFS History: Patient Active Problem List   Diagnosis Date Noted  . Morbid obesity ([place]) 10/11/2018  . Rheumatoid arthritis ([place]) 12/19/2016  . OA (osteoarthritis) of knee 04/07/2016  . Hypothyroidism 01/22/2015  . Chronic low back pain 01/22/2015  . Depression 01/22/2015  . Allergic rhinitis 01/22/2015  . Ureteral calculus 12/26/2012  . Personal history of gastric bypass 11/12/2012  . Hyperlipidemia    Past Medical History:  Diagnosis Date  . Anxiety   . Chronic back pain   . Environmental allergies   . Fibroid   . Hematuria   . History of cystitis    INTERSTITIAL CYSTITIS--  NO ISSUES FOR YRS  . History of gastroesophageal reflux (GERD)    DENIES ISSUES SINCE GASTRIC BY PASS  . History of hypertension    NO ISSUE SINCE WT LOSS AFTER GASTRIC BYPASS  . History of obstructive sleep apnea    USED CPAP--  NO ISSUE SINCE WT LOSS AFTER GASTRIC BYPASS  . Renal calculus, left    NON-OBSTRUCTIVE  . Rheumatoid arthritis ([place])   . Right ureteral stone   . Trochanteric bursitis of both hips     Family History  Problem Relation Age of Onset  . Hypertension Mother   . Stroke Mother   . Hypertension Brother   . Heart disease Maternal Uncle   . Diabetes Maternal Grandmother   . Cancer Maternal Grandmother        THROAT CA  . Cancer Maternal Grandfather        THROAT CA    Past Surgical History:  Procedure Laterality Date  . CARDIAC CATHETERIZATION  12-07-2006  DR [facility]    NORMAL CORONARIES ARTERIES/ NORMAL LVF/ MILD INCREASED END-DIASTOLIC PRESSURE  . CESAREAN SECTION  1998  . CHOLECYSTECTOMY  1980'S  . CYSTOSCOPY WITH RETROGRADE PYELOGRAM, URETEROSCOPY AND STENT PLACEMENT Bilateral 12/26/2012   Procedure: cystoscopy with bilateral retrogrades, bilateral ureteroscopy ;  Surgeon: Bernestine Amass, MD;  Location: [facility];  Service: Urology;  Laterality: Bilateral;  . CYSTOSCOPY WITH STENT PLACEMENT Bilateral 12/26/2012   Procedure: CYSTOSCOPY WITH STENT PLACEMENT;  Surgeon: Bernestine Amass, MD;  Location: [facility];  Service: Urology;  Laterality: Bilateral;  . CYSTOSCOPY/ HYDRODISTENTION  YRS AGO  . HOLMIUM LASER APPLICATION Right 0000000   Procedure: HOLMIUM LASER APPLICATION;  Surgeon: Bernestine Amass,  MD;  Location: [facility];  Service: Urology;  Laterality: Right;  . KNEE ARTHROSCOPY Left 1992  . ROUX-EN-Y PROCEDURE  07-25-2007  . SHOULDER ARTHROSCOPY Right 2011  . TMJ ARTHROPLASTY  1990'S   BILATERAL UPPER   Social History   Occupational History  . Not on file  Tobacco Use  . Smoking status: Never Smoker  . Smokeless tobacco: Never Used  Substance and Sexual Activity  . Alcohol use: No  . Drug use: No  . Sexual activity: Not Currently    Partners: Male    Birth control/protection: Surgical    Comment: husband vasectomy-1st intercourse 26 yo-Fewer than 5 partners

## 2018-11-16 ENCOUNTER — Other Ambulatory Visit: Payer: Self-pay

## 2018-11-16 ENCOUNTER — Encounter: Payer: Self-pay | Admitting: Gynecology

## 2018-11-17 ENCOUNTER — Other Ambulatory Visit: Payer: Self-pay

## 2018-11-17 ENCOUNTER — Encounter (HOSPITAL_COMMUNITY): Payer: Self-pay

## 2018-11-17 ENCOUNTER — Ambulatory Visit: Payer: BC Managed Care – PPO | Admitting: Gynecology

## 2018-11-17 ENCOUNTER — Encounter: Payer: Self-pay | Admitting: Gynecology

## 2018-11-17 ENCOUNTER — Encounter (HOSPITAL_COMMUNITY)
Admission: RE | Admit: 2018-11-17 | Discharge: 2018-11-17 | Disposition: A | Discharge status: Home / Self Care | Payer: BC Managed Care – PPO | Source: Ambulatory Visit | Attending: Gynecology | Admitting: Gynecology

## 2018-11-17 VITALS — BP 146/90

## 2018-11-17 DIAGNOSIS — N840 Polyp of corpus uteri: Secondary | ICD-10-CM

## 2018-11-17 DIAGNOSIS — N950 Postmenopausal bleeding: Secondary | ICD-10-CM

## 2018-11-17 DIAGNOSIS — Z01818 Encounter for other preprocedural examination: Secondary | ICD-10-CM | POA: Insufficient documentation

## 2018-11-17 HISTORY — DX: Depression, unspecified: F32.A

## 2018-11-17 HISTORY — DX: Anemia, unspecified: D64.9

## 2018-11-17 HISTORY — DX: Family history of other specified conditions: Z84.89

## 2018-11-17 HISTORY — DX: Disorder of thyroid, unspecified: E07.9

## 2018-11-17 HISTORY — DX: Gastro-esophageal reflux disease without esophagitis: K21.9

## 2018-11-17 HISTORY — DX: Sleep apnea, unspecified: G47.30

## 2018-11-17 HISTORY — DX: Dyspnea, unspecified: R06.00

## 2018-11-17 HISTORY — DX: Personal history of urinary calculi: Z87.442

## 2018-11-17 LAB — CBC
HCT: 49.3 % — ABNORMAL HIGH (ref 36.0–46.0)
Hemoglobin: 15.5 g/dL — ABNORMAL HIGH (ref 12.0–15.0)
MCH: 30.8 pg (ref 26.0–34.0)
MCHC: 31.4 g/dL (ref 30.0–36.0)
MCV: 97.8 fL (ref 80.0–100.0)
Platelets: 253 10*3/uL (ref 150–400)
RBC: 5.04 MIL/uL (ref 3.87–5.11)
RDW: 12.2 % (ref 11.5–15.5)
WBC: 7.1 10*3/uL (ref 4.0–10.5)
nRBC: 0 % (ref 0.0–0.2)

## 2018-11-17 LAB — COMPREHENSIVE METABOLIC PANEL
ALT: 19 U/L (ref 0–44)
AST: 23 U/L (ref 15–41)
Albumin: 3.9 g/dL (ref 3.5–5.0)
Alkaline Phosphatase: 106 U/L (ref 38–126)
Anion gap: 10 (ref 5–15)
BUN: 13 mg/dL (ref 6–20)
CO2: 28 mmol/L (ref 22–32)
Calcium: 9.2 mg/dL (ref 8.9–10.3)
Chloride: 101 mmol/L (ref 98–111)
Creatinine, Ser: 0.84 mg/dL (ref 0.44–1.00)
GFR calc Af Amer: >60 mL/min (ref >60)
GFR calc non Af Amer: >60 mL/min (ref >60)
Glucose, Bld: 114 mg/dL — ABNORMAL HIGH (ref 70–99)
Potassium: 4.3 mmol/L (ref 3.5–5.1)
Sodium: 139 mmol/L (ref 135–145)
Total Bilirubin: 1.5 mg/dL — ABNORMAL HIGH (ref 0.3–1.2)
Total Protein: 7.2 g/dL (ref 6.5–8.1)

## 2018-11-17 NOTE — Progress Notes (Signed)
[id number] - [place], [place] - [address] [facility] [address] Phone: [phone] Fax: [phone]      Your procedure is scheduled on October 2nd .  Report to [facility] Main Entrance "A" at 0700 A.M., and check in at the Admitting office.  Call this number if you have problems the morning of surgery:  [phone]  Call [phone] if you have any questions prior to your surgery date Monday-Friday 8am-4pm    Remember:  Do not eat or drink after midnight the night before your surgery  You may drink clear liquids until 0600 am the morning of your surgery.   Clear liquids allowed are: Water, Non-Citrus Juices (without pulp), Carbonated Beverages, Clear Tea, Black Coffee Only, and Gatorade    Take these medicines the morning of surgery with A SIP OF WATER  Acetaminophen (TYLENOL)- if needed Tramadol (Ultram)- if needed Bupropion (Wellbutrin) Levocetirizine (Xyzal) Metoprolol succinate (Toprol-XL) Rosuvastatin (Crestor)  7 days prior to surgery STOP taking any Aspirin (unless otherwise instructed by your surgeon), Celebrex, Aleve, Naproxen, Ibuprofen, Motrin, Advil, Goody's, BC's, all herbal medications, fish oil, and all vitamins.    The Morning of Surgery  Do not wear jewelry, make-up or nail polish.  Do not wear lotions, powders, or perfumes, or deodorant  Do not shave 48 hours prior to surgery.   Do not bring valuables to the hospital.  [facility] is not responsible for any belongings or valuables.  If you are a smoker, DO NOT Smoke 24 hours prior to surgery IF you wear a CPAP at night please bring your mask, tubing, and machine the morning of surgery   Remember that you must have someone to transport you home after your surgery, and remain with you for 24 hours if you are discharged the same day.   Contacts, glasses, hearing aids, dentures or bridgework may not be worn into surgery.    Leave your suitcase in the car.  After  surgery it may be brought to your room.  For patients admitted to the hospital, discharge time will be determined by your treatment team.  Patients discharged the day of surgery will not be allowed to drive home.    Special instructions:   [place]- Preparing For Surgery  Before surgery, you can play an important role. Because skin is not sterile, your skin needs to be as free of germs as possible. You can reduce the number of germs on your skin by washing with CHG (chlorahexidine gluconate) Soap before surgery.  CHG is an antiseptic cleaner which kills germs and bonds with the skin to continue killing germs even after washing.    Oral Hygiene is also important to reduce your risk of infection.  Remember - BRUSH YOUR TEETH THE MORNING OF SURGERY WITH YOUR REGULAR TOOTHPASTE  Please do not use if you have an allergy to CHG or antibacterial soaps. If your skin becomes reddened/irritated stop using the CHG.  Do not shave (including legs and underarms) for at least 48 hours prior to first CHG shower. It is OK to shave your face.  Please follow these instructions carefully.   1. Shower the NIGHT BEFORE SURGERY and the MORNING OF SURGERY with CHG Soap.   2. If you chose to wash your hair, wash your hair first as usual with your normal shampoo.  3. After you shampoo, rinse your hair and body thoroughly to remove the shampoo.  4. Use CHG as you would any other liquid  soap. You can apply CHG directly to the skin and wash gently with a scrungie or a clean washcloth.   5. Apply the CHG Soap to your body ONLY FROM THE NECK DOWN.  Do not use on open wounds or open sores. Avoid contact with your eyes, ears, mouth and genitals (private parts). Wash Face and genitals (private parts)  with your normal soap.   6. Wash thoroughly, paying special attention to the area where your surgery will be performed.  7. Thoroughly rinse your body with warm water from the neck down.  8. DO NOT shower/wash with  your normal soap after using and rinsing off the CHG Soap.  9. Pat yourself dry with a CLEAN TOWEL.  10. Wear CLEAN PAJAMAS to bed the night before surgery, wear comfortable clothes the morning of surgery  11. Place CLEAN SHEETS on your bed the night of your first shower and DO NOT SLEEP WITH PETS.    Day of Surgery:  Do not apply any deodorants/lotions. Please shower the morning of surgery with the CHG soap  Please wear clean clothes to the hospital/surgery center.   Remember to brush your teeth WITH YOUR REGULAR TOOTHPASTE.   Please read over the following fact sheets that you were given.

## 2018-11-17 NOTE — Progress Notes (Signed)
    Mary Herman Sep 26, 1960 [id number]        58 y.o.  G1P1001 presents for her preoperative consult for her upcoming hysteroscopy D&C resection of endometrial polyp.  Patient was evaluated last year with bleeding and underwent a sonohysterogram which showed an endometrial echo of 7.5 mm with an echogenic focus measuring 8 x 6 mm.  Subsequently sonohysterogram was performed which showed a 15 x 4 mm endometrial defect consistent with an endometrial polyp.  Endometrial biopsy showed polypoid atrophic endometrial tissue.  No hyperplasia or atypia.  Pap smear was negative.  Past medical history,surgical history, problem list, medications, allergies, family history and social history were all reviewed and documented in the EPIC chart.  Directed ROS with pertinent positives and negatives documented in the history of present illness/assessment and plan.  Exam: [name] assistant Vitals:   11/17/18 1557  BP: (!) 146/90   General appearance:  Normal HEENT normal Lungs clear Cardiac regular rate no rubs murmurs or gallops Abdomen obese without gross masses or tenderness Pelvic external BUS vagina with atrophic changes.  Slight blood staining noted.  Cervix with atrophic changes.  Uterus unable to palpate but no gross masses or tenderness  Assessment/Plan:  [name] with history of postmenopausal bleeding over the past year.  Evaluation last year showed an endometrial polyp and biopsy showed polypoid atrophic endometrium.  Patient is for hysteroscopy D&C with resection of her endometrial polyp.  We discussed that it is been over a year since her sonohysterogram and whether we should repeat it now versus proceeding directly with the hysteroscopy D&C given that she has persistently bled over the year we both agree to proceed with the hysteroscopy D&C.  I reviewed the proposed surgery with the patient to include the expected intraoperative and postoperative courses as well as the recovery  period. The use of the hysteroscope, resectoscope and the D&C portion were all discussed. The risks of surgery to include infection, prolonged antibiotics, hemorrhage necessitating transfusion and the risks of transfusion, including transfusion reaction, hepatitis, HIV, mad cow disease and other unknown entities were all discussed understood and accepted. The risk of damage to internal organs during the procedure, either immediately recognized or delay recognized, including vagina, cervix, uterus, possible perforation causing damage to bowel, bladder, ureters, vessels and nerves necessitating major exploratory reparative surgery and future reparative surgeries including bladder repair, ureteral damage repair, bowel resection, ostomy formation was also discussed understood and accepted. The patient's questions were answered to her satisfaction and she is ready to proceed with surgery.   Anastasio Auerbach MD, 4:09 PM 11/17/2018

## 2018-11-17 NOTE — Patient Instructions (Signed)
Follow-up for surgery as arranged.

## 2018-11-17 NOTE — Progress Notes (Signed)
PCP - Dr. Tedra Sen[place] Cardiologist - N/A  PPM/ICD - N/A Device Orders - N/A Rep Notified N/A  Chest x-ray - N/A  EKG - 11/17/2018 Stress Test - N/A ECHO - 11/03/2011 Cardiac Cath - 12/07/2006  Sleep Study - Yes  CPAP - has not used since 2009  Fasting Blood Sugar - N/A Checks Blood Sugar __N/A___ times a day  Blood Thinner Instructions: N/A Aspirin Instructions: N/A  ERAS Protcol - Yes PRE-SURGERY Ensure - N/A  COVID TEST- 11/22/2018   Anesthesia review: Yes, review EKG  Patient denies shortness of breath, fever, cough and chest pain at PAT appointment   Coronavirus Screening  Have you experienced the following symptoms:  Cough yes/no: No Fever (>100.64F)  yes/no: No Runny nose yes/no: No Sore throat yes/no: No Difficulty breathing/shortness of breath  yes/no: No  Have you or a family member traveled in the last 14 days and where? yes/no: No   If the patient indicates "YES" to the above questions, their PAT will be rescheduled to limit the exposure to others and, the surgeon will be notified. THE PATIENT WILL NEED TO BE ASYMPTOMATIC FOR 14 DAYS.   If the patient is not experiencing any of these symptoms, the PAT nurse will instruct them to NOT bring anyone with them to their appointment since they may have these symptoms or traveled as well.   Please remind your patients and families that hospital visitation restrictions are in effect and the importance of the restrictions.     Patient verbalized understanding of instructions that were given to them at the PAT appointment. Patient was also instructed that they will need to review over the PAT instructions again at home before surgery.

## 2018-11-17 NOTE — H&P (Signed)
Mary Herman 12-Mar-1960 [id number]   History and Physical  Chief complaint: Postmenopausal bleeding, endometrial polyp  History of present illness: [name] has a history of persistent postmenopausal bleeding over the past year.  She was evaluated last year with a sonohysterogram which showed an endometrial echo of 7.5 mm and an echogenic focus measuring 8 x 6 mm.  Subsequently sonohysterogram was performed which showed a 15 x 4 mm endometrial defect consistent with an endometrial polyp.  Endometrial biopsy showed polypoid atrophic endometrial tissue.  No hyperplasia or atypia.  Pap smear was negative.  She is continued to bleed on and off since then.  Past Medical History:  Diagnosis Date  . Anemia   . Anxiety   . Chronic back pain   . Depression   . Dyspnea   . Environmental allergies   . Family history of adverse reaction to anesthesia    daughter- n/v  . Fibroid   . GERD (gastroesophageal reflux disease)   . Hematuria   . History of cystitis    INTERSTITIAL CYSTITIS--  NO ISSUES FOR YRS  . History of gastroesophageal reflux (GERD)    DENIES ISSUES SINCE GASTRIC BY PASS  . History of hypertension    NO ISSUE SINCE WT LOSS AFTER GASTRIC BYPASS  . History of kidney stones   . History of obstructive sleep apnea    USED CPAP--  NO ISSUE SINCE WT LOSS AFTER GASTRIC BYPASS  . Hypertension   . Renal calculus, left    NON-OBSTRUCTIVE  . Rheumatoid arthritis ([place])   . Right ureteral stone   . Sleep apnea   . Thyroid disease   . Trochanteric bursitis of both hips     Past Surgical History:  Procedure Laterality Date  . CARDIAC CATHETERIZATION  12-07-2006  DR [facility]   NORMAL CORONARIES ARTERIES/ NORMAL LVF/ MILD INCREASED END-DIASTOLIC PRESSURE  . CESAREAN SECTION  1998  . CHOLECYSTECTOMY  1980'S  . CYSTOSCOPY WITH RETROGRADE PYELOGRAM, URETEROSCOPY AND STENT PLACEMENT Bilateral 12/26/2012   Procedure: cystoscopy with bilateral retrogrades, bilateral ureteroscopy ;   Surgeon: Bernestine Amass, MD;  Location: [facility];  Service: Urology;  Laterality: Bilateral;  . CYSTOSCOPY WITH STENT PLACEMENT Bilateral 12/26/2012   Procedure: CYSTOSCOPY WITH STENT PLACEMENT;  Surgeon: Bernestine Amass, MD;  Location[facility];  Service: Urology;  Laterality: Bilateral;  . CYSTOSCOPY/ HYDRODISTENTION  YRS AGO  . HOLMIUM LASER APPLICATION Right 0000000   Procedure: HOLMIUM LASER APPLICATION;  Surgeon: Bernestine Amass, MD;  Location: [facility];  Service: Urology;  Laterality: Right;  . KNEE ARTHROSCOPY Left 1992  . ROUX-EN-Y PROCEDURE  07-25-2007  . SHOULDER ARTHROSCOPY Right 2011  . TMJ ARTHROPLASTY  1990'S   BILATERAL UPPER    Family History  Problem Relation Age of Onset  . Hypertension Mother   . Stroke Mother   . Hypertension Brother   . Heart disease Maternal Uncle   . Diabetes Maternal Grandmother   . Cancer Maternal Grandmother        THROAT CA  . Cancer Maternal Grandfather        THROAT CA    Social History:  reports that she has never smoked. She has never used smokeless tobacco. She reports that she does not drink alcohol or use drugs.  Allergies:  Allergies  Allergen Reactions  . Codeine Rash    Tolerates hydrocodone     Medications: See Epic for the most current list of medications.  ROS:  Was performed and pertinent positives and negatives are included in the history of present illness.  Exam: [name] assistant Vitals:   11/17/18 1557  BP: (!) 146/90   General appearance:  Normal HEENT normal Lungs clear Cardiac regular rate no rubs murmurs or gallops Abdomen obese without gross masses or tenderness Pelvic external BUS vagina with atrophic changes.  Slight blood staining noted.  Cervix with atrophic changes.  Uterus unable to palpate but no gross masses or tenderness    Assessment/Plan:  [name] with history of postmenopausal bleeding over the past year.  Evaluation  last year showed an endometrial polyp and biopsy showed polypoid atrophic endometrium.  Patient is for hysteroscopy D&C with resection of her endometrial polyp.  We discussed that it is been over a year since her sonohysterogram and whether we should repeat it now versus proceeding directly with the hysteroscopy D&C given that she has persistently bled over the year we both agree to proceed with the hysteroscopy D&C.  I reviewed the proposed surgery with the patient to include the expected intraoperative and postoperative courses as well as the recovery period. The use of the hysteroscope, resectoscope and the D&C portion were all discussed. The risks of surgery to include infection, prolonged antibiotics, hemorrhage necessitating transfusion and the risks of transfusion, including transfusion reaction, hepatitis, HIV, mad cow disease and other unknown entities were all discussed understood and accepted. The risk of damage to internal organs during the procedure, either immediately recognized or delay recognized, including vagina, cervix, uterus, possible perforation causing damage to bowel, bladder, ureters, vessels and nerves necessitating major exploratory reparative surgery and future reparative surgeries including bladder repair, ureteral damage repair, bowel resection, ostomy formation was also discussed understood and accepted. The patient's questions were answered to her satisfaction and she is ready to proceed with surgery.    Anastasio Auerbach MD, 4:53 PM 11/17/2018

## 2018-11-18 NOTE — Anesthesia Preprocedure Evaluation (Addendum)
Anesthesia Evaluation  Patient identified by MRN, date of birth, ID band Patient awake    Reviewed: Allergy & Precautions, NPO status , Patient's Chart, lab work & pertinent test results, reviewed documented beta blocker date and time   History of Anesthesia Complications (+) Family history of anesthesia reaction  Airway Mallampati: II  TM Distance: >3 FB Neck ROM: Full    Dental  (+) Missing, Poor Dentition, Dental Advisory Given   Pulmonary shortness of breath, sleep apnea ,    Pulmonary exam normal breath sounds clear to auscultation       Cardiovascular hypertension, Pt. on medications and Pt. on home beta blockers Normal cardiovascular exam Rhythm:Regular Rate:Normal     Neuro/Psych PSYCHIATRIC DISORDERS Anxiety Depression negative neurological ROS     GI/Hepatic negative GI ROS, Neg liver ROS, GERD  ,  Endo/Other  Hypothyroidism Morbid obesity  Renal/GU Renal disease     Musculoskeletal  (+) Arthritis ,   Abdominal (+) + obese,   Peds  Hematology negative hematology ROS (+) anemia ,   Anesthesia Other Findings Day of surgery medications reviewed with the patient.  Reproductive/Obstetrics                                                             Anesthesia Evaluation  Patient identified by MRN, date of birth, ID band Patient awake    Reviewed: Allergy & Precautions, H&P , NPO status , Patient's Chart, lab work & pertinent test results  Airway Mallampati: II TM Distance: >3 FB Neck ROM: Full    Dental no notable dental hx.    Pulmonary  No sleep apnea since gastric bypass surgery. breath sounds clear to auscultation  Pulmonary exam normal       Cardiovascular Exercise Tolerance: Good hypertension, Rhythm:Regular Rate:Normal     Neuro/Psych Anxiety negative neurological ROS     GI/Hepatic negative GI ROS, Neg liver ROS,   Endo/Other  Hypothyroidism  Morbid obesity  Renal/GU Renal diseaseBilateral kidney stones.  negative genitourinary   Musculoskeletal negative musculoskeletal ROS (+)   Abdominal (+) + obese,   Peds negative pediatric ROS (+)  Hematology negative hematology ROS (+)   Anesthesia Other Findings   Reproductive/Obstetrics negative OB ROS                          Anesthesia Physical Anesthesia Plan  ASA: III  Anesthesia Plan: General   Post-op Pain Management:    Induction: Intravenous  Airway Management Planned: LMA  Additional Equipment:   Intra-op Plan:   Post-operative Plan: Extubation in OR  Informed Consent: I have reviewed the patients History and Physical, chart, labs and discussed the procedure including the risks, benefits and alternatives for the proposed anesthesia with the patient or authorized representative who has indicated his/her understanding and acceptance.   Dental advisory given  Plan Discussed with: CRNA  Anesthesia Plan Comments:         Anesthesia Quick Evaluation  Anesthesia Physical Anesthesia Plan  ASA: III  Anesthesia Plan: General   Post-op Pain Management:    Induction: Intravenous  PONV Risk Score and Plan: 4 or greater and Ondansetron, Dexamethasone, Midazolam, Scopolamine patch - Pre-op and Treatment may vary due to age or medical condition  Airway Management Planned: LMA  Additional Equipment: None  Intra-op Plan:   Post-operative Plan: Extubation in OR  Informed Consent: I have reviewed the patients History and Physical, chart, labs and discussed the procedure including the risks, benefits and alternatives for the proposed anesthesia with the patient or authorized representative who has indicated his/her understanding and acceptance.     Dental advisory given  Plan Discussed with: CRNA  Anesthesia Plan Comments: (Abnormal preop EKG with poor R-wave progression. This was also present on EKG from 11/05/17. No acute  changes. She has hx of palpitations (stress related per notes) and is on metoprolol prescribed by her PCP. She had a remote cardiac workup in 2008 to eval DOE and abn EKG showing possible anteroseptal infarct prior to bariatric surgery and ultimately had a cath showing normal coronaries. Her dyspnea was thought due to obesity.  TTE 2013: Conclusions: 1.  Sinus rhythm. 2.  This was a technically difficult study with suboptimal views due to body habitus. 3.  Cavity size is decreased. 4.  There is mild concentric left ventricular hypertrophy. 5.  Overall left ventricular systolic function is normal, with an EF between 65 to 70%. 6.  The right ventricle is normal in size and function. 7.  Aortic valve is trileaflet, and appears structurally normal.  No aortic stenosis or regurgitation. 8.  Mild mitral vegetation present. 9.  Trace tricuspid vegetations present. 10.  There is no pericardial effusion.  Cath 2008: CONCLUSION:  1. Normal coronary arteries.  2. Normal left ventricular function.  3. Mildly increased end-diastolic pressure.)       Anesthesia Quick Evaluation

## 2018-11-21 ENCOUNTER — Other Ambulatory Visit: Payer: Self-pay | Admitting: Internal Medicine

## 2018-11-22 ENCOUNTER — Other Ambulatory Visit (HOSPITAL_COMMUNITY)
Admission: RE | Admit: 2018-11-22 | Discharge: 2018-11-22 | Disposition: A | Discharge status: Home / Self Care | Payer: BC Managed Care – PPO | Source: Ambulatory Visit | Attending: Gynecology | Admitting: Gynecology

## 2018-11-22 DIAGNOSIS — Z20828 Contact with and (suspected) exposure to other viral communicable diseases: Secondary | ICD-10-CM | POA: Diagnosis not present

## 2018-11-22 DIAGNOSIS — Z01812 Encounter for preprocedural laboratory examination: Secondary | ICD-10-CM | POA: Diagnosis not present

## 2018-11-22 DIAGNOSIS — N840 Polyp of corpus uteri: Secondary | ICD-10-CM | POA: Diagnosis not present

## 2018-11-22 DIAGNOSIS — N924 Excessive bleeding in the premenopausal period: Secondary | ICD-10-CM | POA: Diagnosis not present

## 2018-11-23 ENCOUNTER — Telehealth: Payer: Self-pay

## 2018-11-23 LAB — NOVEL CORONAVIRUS, NAA (HOSP ORDER, SEND-OUT TO REF LAB; TAT 18-24 HRS): SARS-CoV-2, NAA: NOT DETECTED

## 2018-11-23 NOTE — Telephone Encounter (Signed)
I spoke with patient and confirmed with her that surgery is at 7:30am and she will need to check in at 5:30am.  There was a discrepancy in my schedules and OR schedule. Just wanted to make sure she knew. OR schedule corrected.

## 2018-11-24 MED ORDER — DEXTROSE 5 % IV SOLN
3.0000 g | INTRAVENOUS | Status: AC
  Administered 2018-11-25: 08:00:00 3 g via INTRAVENOUS
  Filled 2018-11-24: qty 3
  Filled 2018-11-24: qty 3000

## 2018-11-25 ENCOUNTER — Encounter (HOSPITAL_COMMUNITY)
Admission: RE | Disposition: A | Discharge status: Home / Self Care | Payer: Self-pay | Source: Home / Self Care | Attending: Gynecology

## 2018-11-25 ENCOUNTER — Ambulatory Visit (HOSPITAL_COMMUNITY)
Admission: RE | Admit: 2018-11-25 | Discharge: 2018-11-25 | Disposition: A | Discharge status: Home / Self Care | Payer: BC Managed Care – PPO | Attending: Gynecology | Admitting: Gynecology

## 2018-11-25 ENCOUNTER — Encounter (HOSPITAL_COMMUNITY): Payer: Self-pay | Admitting: Surgery

## 2018-11-25 ENCOUNTER — Ambulatory Visit (HOSPITAL_COMMUNITY): Payer: BC Managed Care – PPO | Admitting: Certified Registered"

## 2018-11-25 ENCOUNTER — Other Ambulatory Visit: Payer: Self-pay

## 2018-11-25 ENCOUNTER — Ambulatory Visit (HOSPITAL_COMMUNITY): Payer: BC Managed Care – PPO | Admitting: Emergency Medicine

## 2018-11-25 DIAGNOSIS — Z79899 Other long term (current) drug therapy: Secondary | ICD-10-CM | POA: Insufficient documentation

## 2018-11-25 DIAGNOSIS — E785 Hyperlipidemia, unspecified: Secondary | ICD-10-CM | POA: Diagnosis not present

## 2018-11-25 DIAGNOSIS — I10 Essential (primary) hypertension: Secondary | ICD-10-CM | POA: Diagnosis not present

## 2018-11-25 DIAGNOSIS — N950 Postmenopausal bleeding: Secondary | ICD-10-CM | POA: Insufficient documentation

## 2018-11-25 DIAGNOSIS — Z885 Allergy status to narcotic agent status: Secondary | ICD-10-CM | POA: Insufficient documentation

## 2018-11-25 DIAGNOSIS — N840 Polyp of corpus uteri: Secondary | ICD-10-CM | POA: Insufficient documentation

## 2018-11-25 DIAGNOSIS — F419 Anxiety disorder, unspecified: Secondary | ICD-10-CM | POA: Diagnosis not present

## 2018-11-25 DIAGNOSIS — E039 Hypothyroidism, unspecified: Secondary | ICD-10-CM | POA: Insufficient documentation

## 2018-11-25 DIAGNOSIS — Z9884 Bariatric surgery status: Secondary | ICD-10-CM | POA: Diagnosis not present

## 2018-11-25 DIAGNOSIS — G4730 Sleep apnea, unspecified: Secondary | ICD-10-CM | POA: Insufficient documentation

## 2018-11-25 DIAGNOSIS — F329 Major depressive disorder, single episode, unspecified: Secondary | ICD-10-CM | POA: Diagnosis not present

## 2018-11-25 DIAGNOSIS — M545 Low back pain: Secondary | ICD-10-CM | POA: Insufficient documentation

## 2018-11-25 DIAGNOSIS — M1990 Unspecified osteoarthritis, unspecified site: Secondary | ICD-10-CM | POA: Insufficient documentation

## 2018-11-25 DIAGNOSIS — Z79890 Hormone replacement therapy (postmenopausal): Secondary | ICD-10-CM | POA: Insufficient documentation

## 2018-11-25 DIAGNOSIS — N8500 Endometrial hyperplasia, unspecified: Secondary | ICD-10-CM | POA: Diagnosis not present

## 2018-11-25 HISTORY — PX: DILATATION & CURETTAGE/HYSTEROSCOPY WITH MYOSURE: SHX6511

## 2018-11-25 SURGERY — DILATATION & CURETTAGE/HYSTEROSCOPY WITH MYOSURE
Anesthesia: General | Site: Vagina

## 2018-11-25 MED ORDER — KETOROLAC TROMETHAMINE 30 MG/ML IJ SOLN
INTRAMUSCULAR | Status: AC
  Filled 2018-11-25: qty 1

## 2018-11-25 MED ORDER — ONDANSETRON HCL 4 MG/2ML IJ SOLN
INTRAMUSCULAR | Status: DC | PRN
  Administered 2018-11-25: 4 mg via INTRAVENOUS

## 2018-11-25 MED ORDER — OXYCODONE HCL 5 MG PO TABS
5.0000 mg | ORAL_TABLET | Freq: Once | ORAL | Status: AC | PRN
  Administered 2018-11-25: 5 mg via ORAL

## 2018-11-25 MED ORDER — HYDROMORPHONE HCL 1 MG/ML IJ SOLN: 0.2500 mg | INTRAMUSCULAR | Status: DC | PRN

## 2018-11-25 MED ORDER — LACTATED RINGERS IV SOLN
INTRAVENOUS | Status: DC | PRN
  Administered 2018-11-25: 07:00:00 via INTRAVENOUS

## 2018-11-25 MED ORDER — PROPOFOL 10 MG/ML IV BOLUS
INTRAVENOUS | Status: DC | PRN
  Administered 2018-11-25: 200 mg via INTRAVENOUS

## 2018-11-25 MED ORDER — LIDOCAINE HCL 1 % IJ SOLN
INTRAMUSCULAR | Status: AC
  Filled 2018-11-25: qty 20

## 2018-11-25 MED ORDER — PROPOFOL 10 MG/ML IV BOLUS
INTRAVENOUS | Status: AC
  Filled 2018-11-25: qty 20

## 2018-11-25 MED ORDER — LIDOCAINE 2% (20 MG/ML) 5 ML SYRINGE
INTRAMUSCULAR | Status: DC | PRN
  Administered 2018-11-25: 100 mg via INTRAVENOUS

## 2018-11-25 MED ORDER — MIDAZOLAM HCL 2 MG/2ML IJ SOLN
INTRAMUSCULAR | Status: DC | PRN
  Administered 2018-11-25: 2 mg via INTRAVENOUS

## 2018-11-25 MED ORDER — DEXAMETHASONE SODIUM PHOSPHATE 10 MG/ML IJ SOLN
INTRAMUSCULAR | Status: DC | PRN
  Administered 2018-11-25: 5 mg via INTRAVENOUS

## 2018-11-25 MED ORDER — LIDOCAINE HCL 1 % IJ SOLN
INTRAMUSCULAR | Status: DC | PRN
  Administered 2018-11-25: 10 mL

## 2018-11-25 MED ORDER — KETOROLAC TROMETHAMINE 30 MG/ML IJ SOLN
30.0000 mg | Freq: Once | INTRAMUSCULAR | Status: AC | PRN
  Administered 2018-11-25: 30 mg via INTRAVENOUS

## 2018-11-25 MED ORDER — FENTANYL CITRATE (PF) 250 MCG/5ML IJ SOLN
INTRAMUSCULAR | Status: AC
  Filled 2018-11-25: qty 5

## 2018-11-25 MED ORDER — OXYCODONE HCL 5 MG/5ML PO SOLN: 5.0000 mg | Freq: Once | ORAL | Status: AC | PRN

## 2018-11-25 MED ORDER — MEPERIDINE HCL 25 MG/ML IJ SOLN: 6.2500 mg | INTRAMUSCULAR | Status: DC | PRN

## 2018-11-25 MED ORDER — FENTANYL CITRATE (PF) 250 MCG/5ML IJ SOLN
INTRAMUSCULAR | Status: DC | PRN
  Administered 2018-11-25 (×2): 50 ug via INTRAVENOUS

## 2018-11-25 MED ORDER — OXYCODONE HCL 5 MG PO TABS
ORAL_TABLET | ORAL | Status: AC
  Filled 2018-11-25: qty 1

## 2018-11-25 MED ORDER — MIDAZOLAM HCL 2 MG/2ML IJ SOLN
INTRAMUSCULAR | Status: AC
  Filled 2018-11-25: qty 2

## 2018-11-25 MED ORDER — PROMETHAZINE HCL 25 MG/ML IJ SOLN: 6.2500 mg | INTRAMUSCULAR | Status: DC | PRN

## 2018-11-25 MED ORDER — EPHEDRINE SULFATE-NACL 50-0.9 MG/10ML-% IV SOSY
PREFILLED_SYRINGE | INTRAVENOUS | Status: DC | PRN
  Administered 2018-11-25: 10 mg via INTRAVENOUS

## 2018-11-25 MED ORDER — SODIUM CHLORIDE 0.9 % IR SOLN
Status: DC | PRN
  Administered 2018-11-25: 300 mL

## 2018-11-25 SURGICAL SUPPLY — 15 items
CATH ROBINSON RED A/P 16FR (CATHETERS) ×4 IMPLANT
DEVICE MYOSURE LITE (MISCELLANEOUS) IMPLANT
DEVICE MYOSURE REACH (MISCELLANEOUS) ×2 IMPLANT
GLOVE BIO SURGEON STRL SZ7.5 (GLOVE) ×8 IMPLANT
GLOVE BIOGEL PI IND STRL 7.0 (GLOVE) ×2 IMPLANT
GLOVE BIOGEL PI INDICATOR 7.0 (GLOVE) ×2
GOWN STRL REUS W/ TWL LRG LVL3 (GOWN DISPOSABLE) ×4 IMPLANT
GOWN STRL REUS W/TWL LRG LVL3 (GOWN DISPOSABLE) ×6
KIT PROCEDURE FLUENT (KITS) ×4 IMPLANT
KIT TURNOVER KIT B (KITS) ×4 IMPLANT
PACK VAGINAL MINOR WOMEN LF (CUSTOM PROCEDURE TRAY) ×4 IMPLANT
PAD OB MATERNITY 4.3X12.25 (PERSONAL CARE ITEMS) ×4 IMPLANT
SEAL ROD LENS SCOPE MYOSURE (ABLATOR) ×4 IMPLANT
TOWEL GREEN STERILE FF (TOWEL DISPOSABLE) ×8 IMPLANT
UNDERPAD 30X30 (UNDERPADS AND DIAPERS) ×4 IMPLANT

## 2018-11-25 NOTE — Discharge Instructions (Signed)
° °  Postoperative Instructions Hysteroscopy D & C ° °Dr. Ridley Dileo and the nursing staff have discussed postoperative instructions with you.  If you have any questions please ask them before you leave the hospital, or call Dr Vernisha Bacote’s office at [phone].   ° °We would like to emphasize the following instructions: ° ° °? Call the office to make your follow-up appointment as recommended by Dr Marieta Markov (usually 1-2 weeks). ° °? You were given a prescription, or one was ordered for you at the pharmacy you designated.  Get that prescription filled and take the medication according to instructions. ° °? You may eat a regular diet, but slowly until you start having bowel movements. ° °? Drink plenty of water daily. ° °? Nothing in the vagina (intercourse, douching, objects of any kind) for two weeks.  When reinitiating intercourse, if it is uncomfortable, stop and make an appointment with Dr Soua Lenk to be evaluated. ° °? No driving for one to two days until the effects of anesthesia has worn off.  No traveling out of town for several days. ° °? You may shower, but no baths for one week.  Walking up and down stairs is ok.  No heavy lifting, prolonged standing, repeated bending or any “working out” until your post op check. ° °? Rest frequently, listen to your body and do not push yourself and overdo it. ° °? Call if: ° °o Your pain medication does not seem strong enough. °o Worsening pain or abdominal bloating °o Persistent nausea or vomiting °o Difficulty with urination or bowel movements. °o Temperature of 101 degrees or higher. °o Heavy vaginal bleeding.  If your period is due, you may use tampons. °o You have any questions or concerns ° ° ° °

## 2018-11-25 NOTE — H&P (Signed)
The patient was examined.  I reviewed the proposed surgery and consent form with the patient.  The dictated history and physical is current and accurate and all questions were answered. The patient is ready to proceed with surgery and has a realistic understanding and expectation for the outcome.   Anastasio Auerbach MD, 7:04 AM 11/25/2018

## 2018-11-25 NOTE — Op Note (Signed)
Mary Herman [birth year] [id number]   Post Operative Note   Date of surgery:  11/25/2018  Pre Op Dx: Postmenopausal bleeding, endometrial polyp  Post Op Dx:  Postmenopausal bleeding, endometrial polyp  Procedure: Hysteroscopy D&C, MyoSure resection of endometrial polyps  Surgeon:  Belinda Block Annalaya Wile   Anesthesia:  General  EBL: 10 cc  Distended media discrepancy:  80 cc saline  Complications:  None  Specimen: #1 endometrial polyps #2 endometrial curettings to pathology  Findings: EUA: External BUS vagina normal.  Cervix normal.  Uterus difficult to palpate but no gross masses on bimanual   Hysteroscopy: Adequate noting fundus, right/left tubal ostia, anterior/posterior endometrial surfaces, lower uterine segment and endocervical canal all visualized.  5 separate fingerlike endometrial polyps noted within the cavity.  All resected to the level of the surrounding endometrium.  Procedure:  The patient was taken to the operating room, was placed in the low dorsal lithotomy position, underwent general anesthesia, received a perineal/vaginal preparation per nursing personnel and the bladder was emptied with an in and out Foley catheterization. The timeout was performed by the surgical team. An EUA was performed. The patient was draped in the usual fashion. The cervix was visualized with a speculum, anterior lip grasped with a single-tooth tenaculum and a paracervical block was placed using 10 cc's of 1% lidocaine. The cervix was gently dilated to admit the Myosure hysteroscope and hysteroscopy was performed with findings noted above. Using the Myosure Reach resectoscopic wand the polyps were resected in their entirety to the level the surrounding endometrium. A gentle sharp curettage was performed. Both specimens were sent separately to pathology.  Repeat hysteroscopy showed an empty cavity with good distention and no evidence of perforation. The instruments were removed and adequate  hemostasis was visualized at the tenaculum site and external cervical os.  The specimens were identified for pathology.  The sponge, needle and instrument count were verified correct.  The patient was wanded per protocol.  The patient was awakened without difficulty and was taken to the recovery room in good condition having tolerated the procedure well.   Anastasio Auerbach MD, 8:09 AM 11/25/2018    2

## 2018-11-25 NOTE — Transfer of Care (Signed)
Immediate Anesthesia Transfer of Care Note  Patient: Mary Herman  Procedure(s) Performed: DILATATION & CURETTAGE/HYSTEROSCOPY WITH MYOSURE (N/A Vagina )  Patient Location: PACU  Anesthesia Type:General  Level of Consciousness: awake, alert  and patient cooperative  Airway & Oxygen Therapy: Patient Spontanous Breathing  Post-op Assessment: Report given to RN and Post -op Vital signs reviewed and stable  Post vital signs: Reviewed and stable  Last Vitals:  Vitals Value Taken Time  BP    Temp    Pulse    Resp    SpO2      Last Pain:  Vitals:   11/25/18 0557  TempSrc:   PainSc: 2       Patients Stated Pain Goal: 3 ([phone])  Complications: No apparent anesthesia complications

## 2018-11-25 NOTE — Anesthesia Procedure Notes (Signed)
Procedure Name: LMA Insertion Date/Time: 11/25/2018 7:39 AM Performed by: Janace Litten, CRNA Pre-anesthesia Checklist: Patient identified, Emergency Drugs available, Suction available and Patient being monitored Patient Re-evaluated:Patient Re-evaluated prior to induction Oxygen Delivery Method: Circle System Utilized Preoxygenation: Pre-oxygenation with 100% oxygen Induction Type: IV induction Ventilation: Mask ventilation without difficulty LMA: LMA inserted and LMA with gastric port inserted LMA Size: 4.0 Number of attempts: 1 Placement Confirmation: positive ETCO2 Tube secured with: Tape Dental Injury: Teeth and Oropharynx as per pre-operative assessment

## 2018-11-25 NOTE — Anesthesia Postprocedure Evaluation (Signed)
Anesthesia Post Note  Patient: Mary Herman  Procedure(s) Performed: DILATATION & CURETTAGE/HYSTEROSCOPY WITH MYOSURE (N/A Vagina )     Patient location during evaluation: PACU Anesthesia Type: General Level of consciousness: sedated and patient cooperative Pain management: pain level controlled Vital Signs Assessment: post-procedure vital signs reviewed and stable Respiratory status: spontaneous breathing Cardiovascular status: stable Anesthetic complications: no    Last Vitals:  Vitals:   11/25/18 0830 11/25/18 0844  BP: 137/74 116/75  Pulse: 71 71  Resp: 16 16  Temp:    SpO2: 97% 97%    Last Pain:  Vitals:   11/25/18 0815  TempSrc:   PainSc: [place]

## 2018-11-26 ENCOUNTER — Encounter (HOSPITAL_COMMUNITY): Payer: Self-pay | Admitting: Gynecology

## 2018-11-28 LAB — SURGICAL PATHOLOGY

## 2018-11-29 ENCOUNTER — Telehealth: Payer: Self-pay | Admitting: Gynecology

## 2018-11-29 ENCOUNTER — Telehealth: Payer: Self-pay | Admitting: *Deleted

## 2018-11-29 NOTE — Telephone Encounter (Signed)
-----   Message from Anastasio Auerbach, MD sent at 11/29/2018  9:06 AM EDT ----- Arrange for gynecologic oncology consult reference complex atypical hyperplasia in high risk patient.  Alert them that I am retiring and that I would prefer them to manage her as they would recommend.

## 2018-11-29 NOTE — Telephone Encounter (Signed)
Called patient with the pathology from the hysteroscopy D&C where multiple endometrial polyps were resected.  Final pathology showed complex hyperplasia with atypia in both the polyp specimens and the curettings.  We discussed endometrial hyperplasia with and without atypia.  We discussed the precancerous nature of complex hyperplasia with atypia.  Options for ongoing management reviewed to include observation with resampling of the endometrium at some interval to see if all of the atypical hyperplasia was curetted and removed at the original hysteroscopy D&C, progesterone treatment with resampling in the future and hysterectomy.  The pros and cons of each choice discussed.  Surgery would not be without risks in her particular case with her medical issues and her BMI of 55.  Consideration for robotic if hysterectomy to avoid abdominal complications.  I recommended that she have a consultation with gynecologic oncology for their recommendations and ultimate management.

## 2018-11-29 NOTE — Telephone Encounter (Signed)
Referral message sent to GYN oncology they will schedule patient and let me know time and date so I can relay.

## 2018-11-30 NOTE — Telephone Encounter (Signed)
Patient scheduled on 12/13/18 @ 11:30am with Dr.Rossi, patient informed with time and date.

## 2018-12-13 ENCOUNTER — Inpatient Hospital Stay: Payer: BC Managed Care – PPO

## 2018-12-13 ENCOUNTER — Other Ambulatory Visit: Payer: Self-pay

## 2018-12-13 ENCOUNTER — Encounter: Payer: Self-pay | Admitting: Gynecologic Oncology

## 2018-12-13 ENCOUNTER — Other Ambulatory Visit: Payer: Self-pay | Admitting: Gynecologic Oncology

## 2018-12-13 ENCOUNTER — Inpatient Hospital Stay: Payer: BC Managed Care – PPO | Attending: Gynecologic Oncology | Admitting: Gynecologic Oncology

## 2018-12-13 VITALS — BP 132/94 | HR 80 | Temp 98.7°F | Resp 20 | Ht 66.0 in | Wt 311.0 lb

## 2018-12-13 DIAGNOSIS — E039 Hypothyroidism, unspecified: Secondary | ICD-10-CM | POA: Diagnosis not present

## 2018-12-13 DIAGNOSIS — B3789 Other sites of candidiasis: Secondary | ICD-10-CM

## 2018-12-13 DIAGNOSIS — F419 Anxiety disorder, unspecified: Secondary | ICD-10-CM | POA: Diagnosis not present

## 2018-12-13 DIAGNOSIS — I10 Essential (primary) hypertension: Secondary | ICD-10-CM | POA: Insufficient documentation

## 2018-12-13 DIAGNOSIS — Z79899 Other long term (current) drug therapy: Secondary | ICD-10-CM | POA: Diagnosis not present

## 2018-12-13 DIAGNOSIS — Z6841 Body Mass Index (BMI) 40.0 and over, adult: Secondary | ICD-10-CM | POA: Diagnosis not present

## 2018-12-13 DIAGNOSIS — N8500 Endometrial hyperplasia, unspecified: Secondary | ICD-10-CM

## 2018-12-13 DIAGNOSIS — Z791 Long term (current) use of non-steroidal anti-inflammatories (NSAID): Secondary | ICD-10-CM | POA: Diagnosis not present

## 2018-12-13 DIAGNOSIS — E7800 Pure hypercholesterolemia, unspecified: Secondary | ICD-10-CM | POA: Insufficient documentation

## 2018-12-13 DIAGNOSIS — N8502 Endometrial intraepithelial neoplasia [EIN]: Secondary | ICD-10-CM | POA: Insufficient documentation

## 2018-12-13 DIAGNOSIS — F329 Major depressive disorder, single episode, unspecified: Secondary | ICD-10-CM | POA: Diagnosis not present

## 2018-12-13 LAB — HEMOGLOBIN A1C
Hgb A1c MFr Bld: 5.8 % — ABNORMAL HIGH (ref 4.8–5.6)
Mean Plasma Glucose: 119.76 mg/dL

## 2018-12-13 MED ORDER — NYSTATIN 100000 UNIT/GM EX POWD: Freq: Two times a day (BID) | CUTANEOUS | 0 refills | Status: DC

## 2018-12-13 MED ORDER — SENNOSIDES-DOCUSATE SODIUM 8.6-50 MG PO TABS: 2.0000 | ORAL_TABLET | Freq: Every day | ORAL | 0 refills | Status: DC

## 2018-12-13 MED ORDER — OXYCODONE HCL 5 MG PO TABS: 5.0000 mg | ORAL_TABLET | ORAL | 0 refills | Status: DC | PRN

## 2018-12-13 NOTE — Patient Instructions (Addendum)
Preparing for your Surgery  Plan for surgery on January 31, 2019 with Dr. Everitt Amber at [facility] will be scheduled for a Robotic-assisted Total Hysterectomy and Bilateral Salpingo-oophorectomy.   Pre-operative Testing -You will receive a phone call from presurgical testing at [facility] to arrange for a pre-operative testing appointment before your surgery.  This appointment normally occurs one to two weeks before your scheduled surgery.   -Bring your insurance card, copy of an advanced directive if applicable, medication list  -At that visit, you will be asked to sign a consent for a possible blood transfusion in case a transfusion becomes necessary during surgery.  The need for a blood transfusion is rare but having consent is a necessary part of your care.     -You should not be taking blood thinners or aspirin at least ten days prior to surgery unless instructed by your surgeon.  Day Before Surgery at [place] will be asked to take in a light diet the day before surgery.  Avoid carbonated beverages.  You will be advised to have nothing to eat or drink after midnight the evening before.    Eat a light diet the day before surgery.  Examples including soups, broths, toast, yogurt, mashed potatoes.  Things to avoid include carbonated beverages (fizzy beverages), raw fruits and raw vegetables, or beans.   If your bowels are filled with gas, your surgeon will have difficulty visualizing your pelvic organs which increases your surgical risks.  Your role in recovery Your role is to become active as soon as directed by your doctor, while still giving yourself time to heal.  Rest when you feel tired. You will be asked to do the following in order to speed your recovery:  - Cough and breathe deeply. This helps toclear and expand your lungs and can prevent pneumonia.  - Do mild physical activity. Walking or moving your legs help your circulation and body  functions return to normal. A staff member will help you when you try to walk and will provide you with simple exercises. Do not try to get up or walk alone the first time. - Actively manage your pain. Managing your pain lets you move in comfort. We will ask you to rate your pain on a scale of zero to 10. It is your responsibility to tell your doctor or nurse where and how much you hurt so your pain can be treated.  Special Considerations -If you are diabetic, you may be placed on insulin after surgery to have closer control over your blood sugars to promote healing and recovery.  This does not mean that you will be discharged on insulin.  If applicable, your oral antidiabetics will be resumed when you are tolerating a solid diet.  -Your final pathology results from surgery should be available around one week after surgery and the results will be relayed to you when available.  -Dr. Lahoma Crocker is the Surgeon that assists your GYN Oncologist with surgery.  The next day after your surgery you will either see your GYN Oncologist, Dr. Everitt Amber, or Dr. Lahoma Crocker.  -FMLA forms can be faxed to [phone] and please allow 5-7 business days for completion. Eat a light diet the day before surgery.  Examples including soups, broths, toast, yogurt, mashed potatoes.  Things to avoid include carbonated beverages (fizzy beverages), raw fruits and raw vegetables, or beans.   If your bowels are filled with gas, your surgeon will have difficulty visualizing your  pelvic organs which increases your surgical risks.  Blood Transfusion Information WHAT IS A BLOOD TRANSFUSION? A transfusion is the replacement of blood or some of its parts. Blood is made up of multiple cells which provide different functions.  Red blood cells carry oxygen and are used for blood loss replacement.  White blood cells fight against infection.  Platelets control bleeding.  Plasma helps clot blood.  Other blood  products are available for specialized needs, such as hemophilia or other clotting disorders. BEFORE THE TRANSFUSION  Who gives blood for transfusions?   You may be able to donate blood to be used at a later date on yourself (autologous donation).  Relatives can be asked to donate blood. This is generally not any safer than if you have received blood from a stranger. The same precautions are taken to ensure safety when a relative's blood is donated.  Healthy volunteers who are fully evaluated to make sure their blood is safe. This is blood bank blood. Transfusion therapy is the safest it has ever been in the practice of medicine. Before blood is taken from a donor, a complete history is taken to make sure that person has no history of diseases nor engages in risky social behavior (examples are intravenous drug use or sexual activity with multiple partners). The donor's travel history is screened to minimize risk of transmitting infections, such as malaria. The donated blood is tested for signs of infectious diseases, such as HIV and hepatitis. The blood is then tested to be sure it is compatible with you in order to minimize the chance of a transfusion reaction. If you or a relative donates blood, this is often done in anticipation of surgery and is not appropriate for emergency situations. It takes many days to process the donated blood. RISKS AND COMPLICATIONS Although transfusion therapy is very safe and saves many lives, the main dangers of transfusion include:   Getting an infectious disease.  Developing a transfusion reaction. This is an allergic reaction to something in the blood you were given. Every precaution is taken to prevent this. The decision to have a blood transfusion has been considered carefully by your caregiver before blood is given. Blood is not given unless the benefits outweigh the risks. AFTER SURGERY INSTRUCTIONS 01-31-19  Return to work: 4-6 weeks if  applicable  Activity: 1. Be up and out of the bed during the day.  Take a nap if needed.  You may walk up steps but be careful and use the hand rail.  Stair climbing will tire you more than you think, you may need to stop part way and rest.   2. No lifting or straining for 6 weeks.  3. No driving for 1 week(s).  Do not drive if you are taking narcotic pain medicine.  4. Shower daily.  Use soap and water on your incision and pat dry; don't rub.  No tub baths until cleared by your surgeon.   5. No sexual activity and nothing in the vagina for 2 weeks.  6. You may experience a small amount of clear drainage from your incisions, which is normal.  If the drainage persists or increases, please call the office.  7. Take Tylenol or ibuprofen first for pain and only use STRONG PAIN MEDICATION for severe pain not relieved by the Tylenol or Ibuprofen.  Monitor your Tylenol intake to a max of 4,000 mg a day.  Diet: 1. Low sodium Heart Healthy Diet is recommended.  2. It is safe to  use a laxative, such as Miralax or Colace, if you have difficulty moving your bowels. You can take Sennakot at bedtime every evening to keep bowel movements regular and to prevent constipation.    Wound Care: 1. Keep clean and dry.  Shower daily.  Reasons to call the Doctor:  Fever - Oral temperature greater than 100.4 degrees Fahrenheit  Foul-smelling vaginal discharge  Difficulty urinating  Nausea and vomiting  Increased pain at the site of the incision that is unrelieved with pain medicine.  Difficulty breathing with or without chest pain  New calf pain especially if only on one side  Sudden, continuing increased vaginal bleeding with or without clots.   Contacts: For questions or concerns you should contact:  Dr. Everitt Amber at [phone]  Joylene John, NP at [phone]  After Hours: call [phone] and have the GYN Oncologist paged/contacted

## 2018-12-13 NOTE — Progress Notes (Signed)
Consult Note: Gyn-Onc  Consult was requested by Dr. Phineas Real for the evaluation of Mary Herman 58 y.o. female  CC:  Chief Complaint  Patient presents with  . endometrial hyperplasia    Assessment/Plan:  [name]  is a 58 y.o.  year old with morbid obesity (BMI 51kg/m2) and complex atypical endometrial hyperplasia.   I discussed with the patient that she has 2 options:  1/ progestin therapy either oral or via IUD (this could be placed in the OR with D&C).  I discussed that this option carries with it the least risk to the patient and is typically highly effective at ameliorating symptoms and potentially reversing the hyperplasia.  Its downside is that it does not definitively provide hysterectomy specimen for evaluation for occult malignancy.  2/hysterectomy with BSO as part of definitive surgery.  I explained to Mary Herman that she is at particularly high risk for major morbidity due to her extreme obesity.  I explained that this morbidity also includes inability to perform will complete the procedure due to intolerance of Trendelenburg positioning which is necessary to gain access to pelvic viscera.  I explained that in the manipulation of bowel tissue which is most significant in obese patients that she has an increased risk for bowel injury or bladder injury or ureteral injury.  I explained that risk of major complication is substantially higher in patients who morbidly obese compared to normal weight patients.    After hearing this the patient elected to proceed with robotic hysterectomy and BSO because she felt that she " just wanted to get everything out".  I reiterated that she understood that this met assuming greatest surgical risk with this particular option.  She vocalized that she understood this.  We reviewed perioperative planning including anticipation of an outpatient surgical procedure with same-day discharge and ERS pathway.  She takes Celebrex which she can  continue to take preoperatively.  We will avoid ibuprofen for her in addition to Celebrex.  We discussed anticipated postoperative recovery and restrictions.  Surgery is scheduled for December.  She will stay on progesterone until that time.   HPI: [name] is a 58 year old P1 who is seen in consultation at the request of Dr. Phineas Real for evaluation of complex atypical hyperplasia in the setting of morbid obesity.  The patient has a BMI of 51 kg per metered squared.  She has a remote history of a laparoscopic gastric bypass surgery with [place] surgery in 2009.  This initially resulted in a weight loss of 160 pounds, however she then gained all the weight back in recent years.  She began experiencing vaginal bleeding approximately 3 years prior to presentation.  She did not seek medical care for this as she lacked health insurance.  When she gained health insurance she was seen by Dr. Phineas Real for her vaginal bleeding.  An ultrasound scan was performed on 06/21/2017 which did not reveal dimensions of the uterus but did identify several small myomas with the largest measuring 15 mm.  The endometrial echo was 7 mm with an 8 mm focus within the endometrium.  The ovaries were not visualized.  The cul-de-sac was negative.  The sonohysterogram was performed and demonstrated findings consistent with a polyp.  She underwent hysteroscopic a D&C with polypectomy on November 25, 2018.  This revealed an endometrial type polyp with complex atypical hyperplasia.  Pap smear in 2019 was benign.  The patient's medical history is significant for morbid obesity with associated comorbidities of  hypertension hypothyroidism and hypercholesterolemia.  She reports that she does not have diabetes or prediabetes.  She has had 1 prior cesarean section and no vaginal births.  She has had a laparoscopic cholecystectomy in the past and a laparoscopic gastric bypass in 2009 which was uncomplicated.  Currently  she is working from home due to the coronavirus.  She works on a Teaching laboratory technician.  She has no family history significant for malignancies.  Current Meds:  Outpatient Encounter Medications as of 12/13/2018  Medication Sig  . acetaminophen (TYLENOL) 500 MG tablet Take 1,000 mg by mouth every 6 (six) hours as needed for moderate pain or headache.  Marland Kitchen buPROPion (WELLBUTRIN) 75 MG tablet TAKE 2 TABLETS BY MOUTH TWICE A DAY  . celecoxib (CELEBREX) 200 MG capsule Take 1 capsule (200 mg total) by mouth 2 (two) times daily.  . citalopram (CELEXA) 20 MG tablet TAKE 1 TABLET BY MOUTH EVERY DAY IN THE MORNING  . cyclobenzaprine (FLEXERIL) 10 MG tablet TAKE 1 TABLET BY MOUTH EVERYDAY AT BEDTIME  . diazepam (VALIUM) 5 MG tablet Take 1 tablet (5 mg total) by mouth at bedtime as needed. (Patient taking differently: Take 5 mg by mouth at bedtime. )  . ENBREL SURECLICK 50 MG/ML injection Inject 50 mg as directed every Sunday.  . levocetirizine (XYZAL) 5 MG tablet Take 5 mg by mouth daily.  Marland Kitchen levothyroxine (SYNTHROID) 75 MCG tablet TAKE 1 TABLET BY MOUTH EVERY DAY  . Melatonin 10 MG CAPS Take 10 mg by mouth at bedtime as needed (sleep).  . metoprolol succinate (TOPROL-XL) 25 MG 24 hr tablet TAKE 1 TABLET BY MOUTH EVERY DAY (Patient taking differently: Take 25 mg by mouth at bedtime. )  . montelukast (SINGULAIR) 10 MG tablet Take 1 tablet (10 mg total) by mouth at bedtime.  . Multiple Vitamin (MULTIVITAMIN) capsule Take 1 capsule by mouth at bedtime.   Marland Kitchen OVER THE COUNTER MEDICATION Take 2 tablets by mouth at bedtime. Bariatric Iron  . rosuvastatin (CRESTOR) 5 MG tablet TAKE ONE TABLET BY MOUTH THREE TIMES A WEEK (Patient taking differently: Take 5 mg by mouth every Monday, Wednesday, and Friday. )  . traMADol (ULTRAM) 50 MG tablet Take 1-2 tablets (50-100 mg total) by mouth every 8 (eight) hours as needed. (Patient taking differently: Take 50-100 mg by mouth every 8 (eight) hours as needed for moderate pain. )  . VITAMIN  D PO Take 2 capsules by mouth at bedtime.   No facility-administered encounter medications on file as of 12/13/2018.     Allergy:  Allergies  Allergen Reactions  . Codeine Rash    Tolerates hydrocodone     Social Hx:   Social History   Socioeconomic History  . Marital status: Married    Spouse name: Not on file  . Number of children: Not on file  . Years of education: Not on file  . Highest education level: Not on file  Occupational History  . Not on file  Social Needs  . Financial resource strain: Not on file  . Food insecurity    Worry: Not on file    Inability: Not on file  . Transportation needs    Medical: Not on file    Non-medical: Not on file  Tobacco Use  . Smoking status: Never Smoker  . Smokeless tobacco: Never Used  Substance and Sexual Activity  . Alcohol use: No  . Drug use: No  . Sexual activity: Not Currently    Partners: Male    Birth  control/protection: Surgical    Comment: husband vasectomy-1st intercourse 45 yo-Fewer than 5 partners  Lifestyle  . Physical activity    Days per week: Not on file    Minutes per session: Not on file  . Stress: Not on file  Relationships  . Social Herbalist on phone: Not on file    Gets together: Not on file    Attends religious service: Not on file    Active member of club or organization: Not on file    Attends meetings of clubs or organizations: Not on file    Relationship status: Not on file  . Intimate partner violence    Fear of current or ex partner: Not on file    Emotionally abused: Not on file    Physically abused: Not on file    Forced sexual activity: Not on file  Other Topics Concern  . Not on file  Social History Narrative  . Not on file    Past Surgical Hx:  Past Surgical History:  Procedure Laterality Date  . CARDIAC CATHETERIZATION  12-07-2006  [facility]   NORMAL CORONARIES ARTERIES/ NORMAL LVF/ MILD INCREASED END-DIASTOLIC PRESSURE  . CESAREAN SECTION  1998  .  CHOLECYSTECTOMY  1980'S  . CYSTOSCOPY WITH RETROGRADE PYELOGRAM, URETEROSCOPY AND STENT PLACEMENT Bilateral 12/26/2012   Procedure: cystoscopy with bilateral retrogrades, bilateral ureteroscopy ;  Surgeon: Bernestine Amass, MD;  Location: [facility];  Service: Urology;  Laterality: Bilateral;  . CYSTOSCOPY WITH STENT PLACEMENT Bilateral 12/26/2012   Procedure: CYSTOSCOPY WITH STENT PLACEMENT;  Surgeon: Bernestine Amass, MD;  Location: [facility];  Service: Urology;  Laterality: Bilateral;  . CYSTOSCOPY/ HYDRODISTENTION  YRS AGO  . DILATATION & CURETTAGE/HYSTEROSCOPY WITH MYOSURE N/A 11/25/2018   Procedure: [place];  Surgeon: Anastasio Auerbach, MD;  Location: [place];  Service: Gynecology;  Laterality: N/A;  . HOLMIUM LASER APPLICATION Right 75/10/1636   Procedure: HOLMIUM LASER APPLICATION;  Surgeon: Bernestine Amass, MD;  Location: [facility];  Service: Urology;  Laterality: Right;  . KNEE ARTHROSCOPY Left 1992  . ROUX-EN-Y PROCEDURE  07-25-2007  . SHOULDER ARTHROSCOPY Right 2011  . TMJ ARTHROPLASTY  1990'S   BILATERAL UPPER    Past Medical Hx:  Past Medical History:  Diagnosis Date  . Anemia   . Anxiety   . Chronic back pain   . Depression   . Dyspnea   . Environmental allergies   . Family history of adverse reaction to anesthesia    daughter- n/v  . Fibroid   . GERD (gastroesophageal reflux disease)   . Hematuria   . History of cystitis    INTERSTITIAL CYSTITIS--  NO ISSUES FOR YRS  . History of gastroesophageal reflux (GERD)    DENIES ISSUES SINCE GASTRIC BY PASS  . History of hypertension    NO ISSUE SINCE WT LOSS AFTER GASTRIC BYPASS  . History of kidney stones   . History of obstructive sleep apnea    USED CPAP--  NO ISSUE SINCE WT LOSS AFTER GASTRIC BYPASS  . Hypertension   . Renal calculus, left    NON-OBSTRUCTIVE  . Rheumatoid arthritis ([place])   . Right ureteral stone   . Sleep apnea    . Thyroid disease   . Trochanteric bursitis of both hips     Past Gynecological History:  See HPI. C/s x 1 Patient's last menstrual period was 05/25/2014.  Family Hx:  Family History  Problem  Relation Age of Onset  . Hypertension Mother   . Stroke Mother   . Hypertension Brother   . Heart disease Maternal Uncle   . Diabetes Maternal Grandmother   . Cancer Maternal Grandmother        THROAT CA  . Cancer Maternal Grandfather        THROAT CA    Review of Systems:  Constitutional  Feels well,    ENT Normal appearing ears and nares bilaterally Skin/Breast  No rash, sores, jaundice, itching, dryness Cardiovascular  No chest pain, shortness of breath, or edema  Pulmonary  No cough or wheeze.  Gastro Intestinal  No nausea, vomitting, or diarrhoea. No bright red blood per rectum, no abdominal pain, change in bowel movement, or constipation.  Genito Urinary  No frequency, urgency, dysuria, + postmenopausal bleeding Musculo Skeletal  No myalgia, arthralgia, joint swelling or pain  Neurologic  No weakness, numbness, change in gait,  Psychology  No depression, anxiety, insomnia.   Vitals:  Blood pressure (!) 132/94, pulse 80, temperature 98.7 F (37.1 C), temperature source Temporal, resp. rate 20, height '5\' 6"'  (1.676 m), weight (!) 311 lb (141.1 kg), last menstrual period 05/25/2014, SpO2 99 %.  Physical Exam: WD in NAD Neck  Supple NROM, without any enlargements.  Lymph Node Survey No cervical supraclavicular or inguinal adenopathy Cardiovascular  Pulse normal rate, regularity and rhythm. S1 and S2 normal.  Lungs  Clear to auscultation bilateraly, without wheezes/crackles/rhonchi. Good air movement.  Skin  +candida in skin folds.  Psychiatry  Alert and oriented to person, place, and time  Abdomen  Normoactive bowel sounds, abdomen soft, non-tender and obese with pannus and evidence of hernia.  Back No CVA tenderness Genito Urinary  Vulva/vagina: Normal  external female genitalia.  No lesions. No discharge or bleeding.  Bladder/urethra:  No lesions or masses, well supported bladder  Vagina: very long, unable to visualize cervix  Cervix: palpably normal  Uterus: unable to clearly delineate uterine size, mobile, no parametrial involvement or nodularity.  Adnexa: no discretely palpable masses. Rectal  deferred Extremities  No bilateral cyanosis, clubbing or edema.   Thereasa Solo, MD  12/13/2018, 12:52 PM

## 2018-12-13 NOTE — Addendum Note (Signed)
Addended by: Joylene John D on: 12/13/2018 04:53 PM   Modules accepted: Orders

## 2018-12-14 ENCOUNTER — Telehealth: Payer: Self-pay

## 2018-12-14 NOTE — Telephone Encounter (Signed)
Mary Herman states that she is able to stop the Enbrel now until surgery. Told her Her Hgb A1c was good at 5.8. Notified Joylene John, NP that she can continue with her surgery for January 31, 2019.

## 2018-12-14 NOTE — Telephone Encounter (Signed)
LM for Mary Herman to call back to discuss this information.

## 2018-12-29 ENCOUNTER — Encounter: Payer: Self-pay | Admitting: Internal Medicine

## 2018-12-29 ENCOUNTER — Other Ambulatory Visit: Payer: BC Managed Care – PPO | Admitting: Internal Medicine

## 2018-12-29 ENCOUNTER — Ambulatory Visit (INDEPENDENT_AMBULATORY_CARE_PROVIDER_SITE_OTHER): Payer: BC Managed Care – PPO | Admitting: Internal Medicine

## 2018-12-29 ENCOUNTER — Other Ambulatory Visit: Payer: Self-pay

## 2018-12-29 VITALS — BP 110/80 | HR 83 | Temp 98.0°F | Ht 66.0 in | Wt 298.0 lb

## 2018-12-29 DIAGNOSIS — M059 Rheumatoid arthritis with rheumatoid factor, unspecified: Secondary | ICD-10-CM

## 2018-12-29 DIAGNOSIS — F419 Anxiety disorder, unspecified: Secondary | ICD-10-CM | POA: Diagnosis not present

## 2018-12-29 DIAGNOSIS — R7302 Impaired glucose tolerance (oral): Secondary | ICD-10-CM

## 2018-12-29 DIAGNOSIS — E785 Hyperlipidemia, unspecified: Secondary | ICD-10-CM | POA: Diagnosis not present

## 2018-12-29 DIAGNOSIS — E039 Hypothyroidism, unspecified: Secondary | ICD-10-CM

## 2018-12-29 DIAGNOSIS — R002 Palpitations: Secondary | ICD-10-CM

## 2018-12-29 DIAGNOSIS — F439 Reaction to severe stress, unspecified: Secondary | ICD-10-CM | POA: Diagnosis not present

## 2018-12-29 DIAGNOSIS — F329 Major depressive disorder, single episode, unspecified: Secondary | ICD-10-CM

## 2018-12-29 MED ORDER — CLOTRIMAZOLE-BETAMETHASONE 1-0.05 % EX CREA: 1.0000 "application " | TOPICAL_CREAM | Freq: Two times a day (BID) | CUTANEOUS | 0 refills | Status: DC

## 2018-12-29 MED ORDER — ROSUVASTATIN CALCIUM 5 MG PO TABS: ORAL_TABLET | ORAL | 1 refills | Status: DC

## 2018-12-29 MED ORDER — DIAZEPAM 5 MG PO TABS: 5.0000 mg | ORAL_TABLET | Freq: Every evening | ORAL | 1 refills | Status: DC | PRN

## 2018-12-30 LAB — HEPATIC FUNCTION PANEL
AG Ratio: 1.5 (calc) (ref 1.0–2.5)
ALT: 16 U/L (ref 6–29)
AST: 18 U/L (ref 10–35)
Albumin: 3.8 g/dL (ref 3.6–5.1)
Alkaline phosphatase (APISO): 122 U/L (ref 37–153)
Bilirubin, Direct: 0.2 mg/dL (ref 0.0–0.2)
Globulin: 2.6 g/dL (calc) (ref 1.9–3.7)
Indirect Bilirubin: 0.8 mg/dL (calc) (ref 0.2–1.2)
Total Bilirubin: 1 mg/dL (ref 0.2–1.2)
Total Protein: 6.4 g/dL (ref 6.1–8.1)

## 2018-12-30 LAB — LIPID PANEL
Cholesterol: 170 mg/dL (ref <200)
HDL: 90 mg/dL (ref >=50)
LDL Cholesterol (Calc): 62 mg/dL (calc)
Non-HDL Cholesterol (Calc): 80 mg/dL (calc) (ref <130)
Total CHOL/HDL Ratio: 1.9 (calc) (ref <5.0)
Triglycerides: 94 mg/dL (ref <150)

## 2019-01-03 ENCOUNTER — Other Ambulatory Visit: Payer: Self-pay | Admitting: Internal Medicine

## 2019-01-05 ENCOUNTER — Encounter: Payer: Self-pay | Admitting: Gynecologic Oncology

## 2019-01-13 ENCOUNTER — Encounter: Payer: Self-pay | Admitting: Gynecologic Oncology

## 2019-01-24 NOTE — Patient Instructions (Signed)
DUE TO COVID-19 ONLY ONE VISITOR IS ALLOWED TO COME WITH YOU AND STAY IN THE WAITING ROOM ONLY DURING PRE OP AND PROCEDURE DAY OF SURGERY. THE 1 VISITOR MAY VISIT WITH YOU AFTER SURGERY IN YOUR PRIVATE ROOM DURING VISITING HOURS ONLY!  YOU NEED TO HAVE A COVID 19 TEST ON__12-4_____ @_______ , THIS TEST MUST BE DONE BEFORE SURGERY, COME  Mary Herman , [id number].  ([place]) ONCE YOUR COVID TEST IS COMPLETED, PLEASE BEGIN THE QUARANTINE INSTRUCTIONS AS OUTLINED IN YOUR HANDOUT.                Mary Herman   Your procedure is scheduled on: 12-8   Report to [facility] Main  Entrance   Report to [place] at 5:30AM     Call this number if you have problems the morning of surgery [phone]    Remember: Eat a light diet the day before surgery.  Examples including soups, broths, toast, yogurt, mashed potatoes.  Things to avoid include carbonated beverages (fizzy beverages), raw fruits and raw vegetables, or beans. If your bowels are filled with gas, your surgeon will have difficulty visualizing your pelvic organs which increases your surgical risks.  Do not eat food or drink liquids :After Midnight.   BRUSH YOUR TEETH MORNING OF SURGERY AND RINSE YOUR MOUTH OUT, NO CHEWING GUM CANDY OR MINTS.     Take these medicines the morning of surgery with A SIP OF WATER: WELLBUTRIN, CELEXA, XYZAL, METOPROLOL, ROSUVASTATIN, TYLENOL IF NEEDED                                 You may not have any metal on your body including hair pins and              piercings  Do not wear jewelry, make-up, lotions, powders or perfumes, deodorant             Do not wear nail polish on your fingernails.  Do not shave  48 hours prior to surgery.                Do not bring valuables to the hospital. [place].  Contacts, dentures or bridgework may not be worn into surgery.      Patients discharged the day of surgery  will not be allowed to drive home. IF YOU ARE HAVING SURGERY AND GOING HOME THE SAME DAY, YOU MUST HAVE AN ADULT TO DRIVE YOU HOME AND BE WITH YOU FOR 24 HOURS. YOU MAY GO HOME BY TAXI OR UBER OR ORTHERWISE, BUT AN ADULT MUST ACCOMPANY YOU HOME AND STAY WITH YOU FOR 24 HOURS.  Name and phone number of your driver:  Special Instructions: N/A              Please read over the following fact sheets you were given: _____________________________________________________________________             [facility] - Preparing for Surgery Before surgery, you can play an important role.  Because skin is not sterile, your skin needs to be as free of germs as possible.  You can reduce the number of germs on your skin by washing with CHG (chlorahexidine gluconate) soap before surgery.  CHG is an antiseptic cleaner which kills germs and bonds with the skin  to continue killing germs even after washing. Please DO NOT use if you have an allergy to CHG or antibacterial soaps.  If your skin becomes reddened/irritated stop using the CHG and inform your nurse when you arrive at Short Stay. Do not shave (including legs and underarms) for at least 48 hours prior to the first CHG shower.  You may shave your face/neck. Please follow these instructions carefully:  1.  Shower with CHG Soap the night before surgery and the  morning of Surgery.  2.  If you choose to wash your hair, wash your hair first as usual with your  normal  shampoo.  3.  After you shampoo, rinse your hair and body thoroughly to remove the  shampoo.                           4.  Use CHG as you would any other liquid soap.  You can apply chg directly  to the skin and wash                       Gently with a scrungie or clean washcloth.  5.  Apply the CHG Soap to your body ONLY FROM THE NECK DOWN.   Do not use on face/ open                           Wound or open sores. Avoid contact with eyes, ears mouth and genitals (private parts).                        Wash face,  Genitals (private parts) with your normal soap.             6.  Wash thoroughly, paying special attention to the area where your surgery  will be performed.  7.  Thoroughly rinse your body with warm water from the neck down.  8.  DO NOT shower/wash with your normal soap after using and rinsing off  the CHG Soap.                9.  Pat yourself dry with a clean towel.            10.  Wear clean pajamas.            11.  Place clean sheets on your bed the night of your first shower and do not  sleep with pets. Day of Surgery : Do not apply any lotions/deodorants the morning of surgery.  Please wear clean clothes to the hospital/surgery center.  FAILURE TO FOLLOW THESE INSTRUCTIONS MAY RESULT IN THE CANCELLATION OF YOUR SURGERY PATIENT SIGNATURE_________________________________  NURSE SIGNATURE__________________________________  ________________________________________________________________________   Mary Herman  An incentive spirometer is a tool that can help keep your lungs clear and active. This tool measures how well you are filling your lungs with each breath. Taking long deep breaths may help reverse or decrease the chance of developing breathing (pulmonary) problems (especially infection) following:  A long period of time when you are unable to move or be active. BEFORE THE PROCEDURE   If the spirometer includes an indicator to show your best effort, your nurse or respiratory therapist will set it to a desired goal.  If possible, sit up straight or lean slightly forward. Try not to slouch.  Hold the incentive spirometer in an upright position. INSTRUCTIONS FOR USE  1.  Sit on the edge of your bed if possible, or sit up as far as you can in bed or on a chair. 2. Hold the incentive spirometer in an upright position. 3. Breathe out normally. 4. Place the mouthpiece in your mouth and seal your lips tightly around it. 5. Breathe in slowly and as deeply as  possible, raising the piston or the ball toward the top of the column. 6. Hold your breath for 3-5 seconds or for as long as possible. Allow the piston or ball to fall to the bottom of the column. 7. Remove the mouthpiece from your mouth and breathe out normally. 8. Rest for a few seconds and repeat Steps 1 through 7 at least 10 times every 1-2 hours when you are awake. Take your time and take a few normal breaths between deep breaths. 9. The spirometer may include an indicator to show your best effort. Use the indicator as a goal to work toward during each repetition. 10. After each set of 10 deep breaths, practice coughing to be sure your lungs are clear. If you have an incision (the cut made at the time of surgery), support your incision when coughing by placing a pillow or rolled up towels firmly against it. Once you are able to get out of bed, walk around indoors and cough well. You may stop using the incentive spirometer when instructed by your caregiver.  RISKS AND COMPLICATIONS  Take your time so you do not get dizzy or light-headed.  If you are in pain, you may need to take or ask for pain medication before doing incentive spirometry. It is harder to take a deep breath if you are having pain. AFTER USE  Rest and breathe slowly and easily.  It can be helpful to keep track of a log of your progress. Your caregiver can provide you with a simple table to help with this. If you are using the spirometer at home, follow these instructions: Mary Herman IF:   You are having difficultly using the spirometer.  You have trouble using the spirometer as often as instructed.  Your pain medication is not giving enough relief while using the spirometer.  You develop fever of 100.5 F (38.1 C) or higher. SEEK IMMEDIATE MEDICAL CARE IF:   You cough up bloody sputum that had not been present before.  You develop fever of 102 F (38.9 C) or greater.  You develop worsening pain at or near  the incision site. MAKE SURE YOU:   Understand these instructions.  Will watch your condition.  Will get help right away if you are not doing well or get worse. Document Released: 06/22/2006 Document Revised: 05/04/2011 Document Reviewed: 08/23/2006 Exit[employer] Patient Information 2014 [employer], [place].   ________________________________________________________________________  WHAT IS A BLOOD TRANSFUSION? Blood Transfusion Information  A transfusion is the replacement of blood or some of its parts. Blood is made up of multiple cells which provide different functions.  Red blood cells carry oxygen and are used for blood loss replacement.  White blood cells fight against infection.  Platelets control bleeding.  Plasma helps clot blood.  Other blood products are available for specialized needs, such as hemophilia or other clotting disorders. BEFORE THE TRANSFUSION  Who gives blood for transfusions?   Healthy volunteers who are fully evaluated to make sure their blood is safe. This is blood bank blood. Transfusion therapy is the safest it has ever been in the practice of medicine. Before blood is taken from a donor,  a complete history is taken to make sure that person has no history of diseases nor engages in risky social behavior (examples are intravenous drug use or sexual activity with multiple partners). The donor's travel history is screened to minimize risk of transmitting infections, such as malaria. The donated blood is tested for signs of infectious diseases, such as HIV and hepatitis. The blood is then tested to be sure it is compatible with you in order to minimize the chance of a transfusion reaction. If you or a relative donates blood, this is often done in anticipation of surgery and is not appropriate for emergency situations. It takes many days to process the donated blood. RISKS AND COMPLICATIONS Although transfusion therapy is very safe and saves many lives, the main  dangers of transfusion include:   Getting an infectious disease.  Developing a transfusion reaction. This is an allergic reaction to something in the blood you were given. Every precaution is taken to prevent this. The decision to have a blood transfusion has been considered carefully by your caregiver before blood is given. Blood is not given unless the benefits outweigh the risks. AFTER THE TRANSFUSION  Right after receiving a blood transfusion, you will usually feel much better and more energetic. This is especially true if your red blood cells have gotten low (anemic). The transfusion raises the level of the red blood cells which carry oxygen, and this usually causes an energy increase.  The nurse administering the transfusion will monitor you carefully for complications. HOME CARE INSTRUCTIONS  No special instructions are needed after a transfusion. You may find your energy is better. Speak with your caregiver about any limitations on activity for underlying diseases you may have. SEEK MEDICAL CARE IF:   Your condition is not improving after your transfusion.  You develop redness or irritation at the intravenous (IV) site. SEEK IMMEDIATE MEDICAL CARE IF:  Any of the following symptoms occur over the next 12 hours:  Shaking chills.  You have a temperature by mouth above 102 F (38.9 C), not controlled by medicine.  Chest, back, or muscle pain.  People around you feel you are not acting correctly or are confused.  Shortness of breath or difficulty breathing.  Dizziness and fainting.  You get a rash or develop hives.  You have a decrease in urine output.  Your urine turns a dark color or changes to pink, red, or brown. Any of the following symptoms occur over the next 10 days:  You have a temperature by mouth above 102 F (38.9 C), not controlled by medicine.  Shortness of breath.  Weakness after normal activity.  The white part of the eye turns yellow  (jaundice).  You have a decrease in the amount of urine or are urinating less often.  Your urine turns a dark color or changes to pink, red, or brown. Document Released: 02/07/2000 Document Revised: 05/04/2011 Document Reviewed: 09/26/2007 [facility] Patient Information 2014 [place], [place].  _______________________________________________________________________

## 2019-01-27 ENCOUNTER — Other Ambulatory Visit: Payer: Self-pay

## 2019-01-27 ENCOUNTER — Encounter (HOSPITAL_COMMUNITY): Payer: Self-pay

## 2019-01-27 ENCOUNTER — Encounter (HOSPITAL_COMMUNITY)
Admission: RE | Admit: 2019-01-27 | Discharge: 2019-01-27 | Disposition: A | Discharge status: Home / Self Care | Payer: BC Managed Care – PPO | Source: Ambulatory Visit | Attending: Gynecologic Oncology | Admitting: Gynecologic Oncology

## 2019-01-27 ENCOUNTER — Other Ambulatory Visit (HOSPITAL_COMMUNITY)
Admission: RE | Admit: 2019-01-27 | Discharge: 2019-01-27 | Disposition: A | Discharge status: Home / Self Care | Payer: BC Managed Care – PPO | Source: Ambulatory Visit | Attending: Gynecologic Oncology | Admitting: Gynecologic Oncology

## 2019-01-27 DIAGNOSIS — N8502 Endometrial intraepithelial neoplasia [EIN]: Secondary | ICD-10-CM | POA: Diagnosis not present

## 2019-01-27 DIAGNOSIS — E039 Hypothyroidism, unspecified: Secondary | ICD-10-CM | POA: Diagnosis not present

## 2019-01-27 DIAGNOSIS — F329 Major depressive disorder, single episode, unspecified: Secondary | ICD-10-CM | POA: Diagnosis not present

## 2019-01-27 DIAGNOSIS — N72 Inflammatory disease of cervix uteri: Secondary | ICD-10-CM | POA: Diagnosis not present

## 2019-01-27 DIAGNOSIS — Z791 Long term (current) use of non-steroidal anti-inflammatories (NSAID): Secondary | ICD-10-CM | POA: Diagnosis not present

## 2019-01-27 DIAGNOSIS — Z79899 Other long term (current) drug therapy: Secondary | ICD-10-CM | POA: Diagnosis not present

## 2019-01-27 DIAGNOSIS — Z01812 Encounter for preprocedural laboratory examination: Secondary | ICD-10-CM | POA: Insufficient documentation

## 2019-01-27 DIAGNOSIS — N83292 Other ovarian cyst, left side: Secondary | ICD-10-CM | POA: Diagnosis not present

## 2019-01-27 DIAGNOSIS — Z79890 Hormone replacement therapy (postmenopausal): Secondary | ICD-10-CM | POA: Diagnosis not present

## 2019-01-27 DIAGNOSIS — Z6841 Body Mass Index (BMI) 40.0 and over, adult: Secondary | ICD-10-CM | POA: Diagnosis not present

## 2019-01-27 DIAGNOSIS — E7800 Pure hypercholesterolemia, unspecified: Secondary | ICD-10-CM | POA: Diagnosis not present

## 2019-01-27 DIAGNOSIS — K660 Peritoneal adhesions (postprocedural) (postinfection): Secondary | ICD-10-CM | POA: Diagnosis not present

## 2019-01-27 DIAGNOSIS — Z20828 Contact with and (suspected) exposure to other viral communicable diseases: Secondary | ICD-10-CM | POA: Diagnosis not present

## 2019-01-27 DIAGNOSIS — N83291 Other ovarian cyst, right side: Secondary | ICD-10-CM | POA: Diagnosis not present

## 2019-01-27 DIAGNOSIS — Z9884 Bariatric surgery status: Secondary | ICD-10-CM | POA: Diagnosis not present

## 2019-01-27 DIAGNOSIS — I10 Essential (primary) hypertension: Secondary | ICD-10-CM | POA: Diagnosis not present

## 2019-01-27 HISTORY — DX: Prediabetes: R73.03

## 2019-01-27 HISTORY — DX: Pneumonia, unspecified organism: J18.9

## 2019-01-27 LAB — COMPREHENSIVE METABOLIC PANEL
ALT: 22 U/L (ref 0–44)
AST: 24 U/L (ref 15–41)
Albumin: 3.7 g/dL (ref 3.5–5.0)
Alkaline Phosphatase: 118 U/L (ref 38–126)
Anion gap: 9 (ref 5–15)
BUN: 13 mg/dL (ref 6–20)
CO2: 26 mmol/L (ref 22–32)
Calcium: 8.9 mg/dL (ref 8.9–10.3)
Chloride: 105 mmol/L (ref 98–111)
Creatinine, Ser: 0.87 mg/dL (ref 0.44–1.00)
GFR calc Af Amer: >60 mL/min (ref >60)
GFR calc non Af Amer: >60 mL/min (ref >60)
Glucose, Bld: 101 mg/dL — ABNORMAL HIGH (ref 70–99)
Potassium: 4.2 mmol/L (ref 3.5–5.1)
Sodium: 140 mmol/L (ref 135–145)
Total Bilirubin: 1.4 mg/dL — ABNORMAL HIGH (ref 0.3–1.2)
Total Protein: 7.1 g/dL (ref 6.5–8.1)

## 2019-01-27 LAB — URINALYSIS, ROUTINE W REFLEX MICROSCOPIC
Bilirubin Urine: NEGATIVE
Glucose, UA: NEGATIVE mg/dL
Hgb urine dipstick: NEGATIVE
Ketones, ur: NEGATIVE mg/dL
Nitrite: NEGATIVE
Protein, ur: 30 mg/dL — AB
Specific Gravity, Urine: 1.027 (ref 1.005–1.030)
pH: 5 (ref 5.0–8.0)

## 2019-01-27 LAB — CBC
HCT: 46.3 % — ABNORMAL HIGH (ref 36.0–46.0)
Hemoglobin: 14.4 g/dL (ref 12.0–15.0)
MCH: 30.2 pg (ref 26.0–34.0)
MCHC: 31.1 g/dL (ref 30.0–36.0)
MCV: 97.1 fL (ref 80.0–100.0)
Platelets: 220 10*3/uL (ref 150–400)
RBC: 4.77 MIL/uL (ref 3.87–5.11)
RDW: 12.3 % (ref 11.5–15.5)
WBC: 6.3 10*3/uL (ref 4.0–10.5)
nRBC: 0 % (ref 0.0–0.2)

## 2019-01-27 LAB — ABO/RH: ABO/RH(D): A NEG

## 2019-01-27 NOTE — Progress Notes (Signed)
PCP - Tedra [place] Cardiologist -   Chest x-ray -  EKG - 04-18-18 epic Stress Test -  ECHO -  Cardiac Cath -  hgba1c   12-13-18- epic  Sleep Study -  CPAP - osa no cpap  Fasting Blood Sugar -  Checks Blood Sugar _____ times a day  Blood Thinner Instructions: Aspirin Instructions: Last Dose:  Anesthesia review: BMI 53.3  Recent surgery 11-25-18 d&c hysteroscopy no problems , UA  Patient denies shortness of breath, fever, cough and chest pain at PAT appointment   NONE does get SOB with activity walking stairs , does own house work    Patient verbalized understanding of instructions that were given to them at the PAT appointment. Patient was also instructed that they will need to review over the PAT instructions again at home before surgery.

## 2019-01-28 LAB — NOVEL CORONAVIRUS, NAA (HOSP ORDER, SEND-OUT TO REF LAB; TAT 18-24 HRS): SARS-CoV-2, NAA: NOT DETECTED

## 2019-01-28 LAB — URINE CULTURE

## 2019-01-30 ENCOUNTER — Telehealth: Payer: Self-pay

## 2019-01-30 MED ORDER — DEXTROSE 5 % IV SOLN
3.0000 g | INTRAVENOUS | Status: AC
  Administered 2019-01-31: 08:00:00 3 g via INTRAVENOUS
  Filled 2019-01-30: qty 3

## 2019-01-30 NOTE — Telephone Encounter (Signed)
Ms Bosma understands pre op instructions . She wondered when she would be able to restart her Enbrel after her surgery tomorrow and could she have a prednisone dose pack as well. She is not able to use her hands at this time. Reviewed with Joylene John, NP.  She may not be able to restart for a couple of weeks and a prednisone dose pack would interfere with her healing as well as Enbrel. Melissa suggested to speak with Dr. Denman George in the morning prior to her surgery to get a restart recommendation for the Enbrel. Pt verbalized understanding.

## 2019-01-31 ENCOUNTER — Encounter (HOSPITAL_COMMUNITY)
Admission: RE | Disposition: A | Discharge status: Home / Self Care | Payer: Self-pay | Source: Home / Self Care | Attending: Gynecologic Oncology

## 2019-01-31 ENCOUNTER — Ambulatory Visit (HOSPITAL_COMMUNITY): Payer: BC Managed Care – PPO | Admitting: Anesthesiology

## 2019-01-31 ENCOUNTER — Ambulatory Visit (HOSPITAL_COMMUNITY)
Admission: RE | Admit: 2019-01-31 | Discharge: 2019-01-31 | Disposition: A | Discharge status: Home / Self Care | Payer: BC Managed Care – PPO | Attending: Gynecologic Oncology | Admitting: Gynecologic Oncology

## 2019-01-31 ENCOUNTER — Ambulatory Visit (HOSPITAL_COMMUNITY): Payer: BC Managed Care – PPO | Admitting: Physician Assistant

## 2019-01-31 ENCOUNTER — Encounter (HOSPITAL_COMMUNITY): Payer: Self-pay

## 2019-01-31 DIAGNOSIS — K660 Peritoneal adhesions (postprocedural) (postinfection): Secondary | ICD-10-CM | POA: Diagnosis not present

## 2019-01-31 DIAGNOSIS — Z791 Long term (current) use of non-steroidal anti-inflammatories (NSAID): Secondary | ICD-10-CM | POA: Diagnosis not present

## 2019-01-31 DIAGNOSIS — Z79890 Hormone replacement therapy (postmenopausal): Secondary | ICD-10-CM | POA: Insufficient documentation

## 2019-01-31 DIAGNOSIS — F329 Major depressive disorder, single episode, unspecified: Secondary | ICD-10-CM | POA: Insufficient documentation

## 2019-01-31 DIAGNOSIS — N83291 Other ovarian cyst, right side: Secondary | ICD-10-CM | POA: Insufficient documentation

## 2019-01-31 DIAGNOSIS — Z9884 Bariatric surgery status: Secondary | ICD-10-CM | POA: Diagnosis not present

## 2019-01-31 DIAGNOSIS — E7800 Pure hypercholesterolemia, unspecified: Secondary | ICD-10-CM | POA: Diagnosis not present

## 2019-01-31 DIAGNOSIS — Z79899 Other long term (current) drug therapy: Secondary | ICD-10-CM | POA: Insufficient documentation

## 2019-01-31 DIAGNOSIS — N72 Inflammatory disease of cervix uteri: Secondary | ICD-10-CM | POA: Insufficient documentation

## 2019-01-31 DIAGNOSIS — E039 Hypothyroidism, unspecified: Secondary | ICD-10-CM | POA: Diagnosis not present

## 2019-01-31 DIAGNOSIS — I10 Essential (primary) hypertension: Secondary | ICD-10-CM | POA: Diagnosis not present

## 2019-01-31 DIAGNOSIS — Z6841 Body Mass Index (BMI) 40.0 and over, adult: Secondary | ICD-10-CM | POA: Diagnosis not present

## 2019-01-31 DIAGNOSIS — N83292 Other ovarian cyst, left side: Secondary | ICD-10-CM | POA: Insufficient documentation

## 2019-01-31 DIAGNOSIS — N8502 Endometrial intraepithelial neoplasia [EIN]: Secondary | ICD-10-CM | POA: Diagnosis not present

## 2019-01-31 DIAGNOSIS — N8500 Endometrial hyperplasia, unspecified: Secondary | ICD-10-CM

## 2019-01-31 HISTORY — PX: ROBOTIC ASSISTED TOTAL HYSTERECTOMY WITH BILATERAL SALPINGO OOPHERECTOMY: SHX6086

## 2019-01-31 HISTORY — PX: TOTAL ABDOMINAL HYSTERECTOMY: SHX209

## 2019-01-31 LAB — TYPE AND SCREEN
ABO/RH(D): A NEG
Antibody Screen: NEGATIVE

## 2019-01-31 SURGERY — HYSTERECTOMY, TOTAL, ROBOT-ASSISTED, LAPAROSCOPIC, WITH BILATERAL SALPINGO-OOPHORECTOMY
Anesthesia: General

## 2019-01-31 MED ORDER — LABETALOL HCL 5 MG/ML IV SOLN
5.0000 mg | Freq: Once | INTRAVENOUS | Status: AC
  Administered 2019-01-31: 5 mg via INTRAVENOUS

## 2019-01-31 MED ORDER — EPHEDRINE 5 MG/ML INJ
INTRAVENOUS | Status: AC
  Filled 2019-01-31: qty 10

## 2019-01-31 MED ORDER — SUGAMMADEX SODIUM 200 MG/2ML IV SOLN
INTRAVENOUS | Status: DC | PRN
  Administered 2019-01-31: 300 mg via INTRAVENOUS

## 2019-01-31 MED ORDER — SUGAMMADEX SODIUM 500 MG/5ML IV SOLN
INTRAVENOUS | Status: AC
  Filled 2019-01-31: qty 5

## 2019-01-31 MED ORDER — ONDANSETRON HCL 4 MG/2ML IJ SOLN
INTRAMUSCULAR | Status: DC | PRN
  Administered 2019-01-31: 4 mg via INTRAVENOUS

## 2019-01-31 MED ORDER — LACTATED RINGERS IV SOLN
INTRAVENOUS | Status: DC
  Administered 2019-01-31 (×2): via INTRAVENOUS

## 2019-01-31 MED ORDER — EPHEDRINE SULFATE-NACL 50-0.9 MG/10ML-% IV SOSY
PREFILLED_SYRINGE | INTRAVENOUS | Status: DC | PRN
  Administered 2019-01-31 (×2): 10 mg via INTRAVENOUS
  Administered 2019-01-31 (×2): 15 mg via INTRAVENOUS

## 2019-01-31 MED ORDER — LABETALOL HCL 5 MG/ML IV SOLN
INTRAVENOUS | Status: AC
  Filled 2019-01-31: qty 4

## 2019-01-31 MED ORDER — KETAMINE HCL 10 MG/ML IJ SOLN
INTRAMUSCULAR | Status: AC
  Filled 2019-01-31: qty 1

## 2019-01-31 MED ORDER — ONDANSETRON HCL 4 MG/2ML IJ SOLN: 4.0000 mg | Freq: Once | INTRAMUSCULAR | Status: DC | PRN

## 2019-01-31 MED ORDER — BUPIVACAINE HCL (PF) 0.25 % IJ SOLN
INTRAMUSCULAR | Status: DC | PRN
  Administered 2019-01-31: 20 mL

## 2019-01-31 MED ORDER — PHENYLEPHRINE 40 MCG/ML (10ML) SYRINGE FOR IV PUSH (FOR BLOOD PRESSURE SUPPORT)
PREFILLED_SYRINGE | INTRAVENOUS | Status: AC
  Filled 2019-01-31: qty 10

## 2019-01-31 MED ORDER — STERILE WATER FOR IRRIGATION IR SOLN
Status: DC | PRN
  Administered 2019-01-31: 1000 mL

## 2019-01-31 MED ORDER — ACETAMINOPHEN 325 MG PO TABS: 650.0000 mg | ORAL_TABLET | ORAL | Status: DC | PRN

## 2019-01-31 MED ORDER — SUCCINYLCHOLINE CHLORIDE 20 MG/ML IJ SOLN
INTRAMUSCULAR | Status: DC | PRN
  Administered 2019-01-31: 160 mg via INTRAVENOUS

## 2019-01-31 MED ORDER — FENTANYL CITRATE (PF) 100 MCG/2ML IJ SOLN
INTRAMUSCULAR | Status: DC | PRN
  Administered 2019-01-31: 100 ug via INTRAVENOUS

## 2019-01-31 MED ORDER — LABETALOL HCL 5 MG/ML IV SOLN
5.0000 mg | Freq: Once | INTRAVENOUS | Status: AC
  Administered 2019-01-31: 10:00:00 5 mg via INTRAVENOUS

## 2019-01-31 MED ORDER — ENOXAPARIN SODIUM 40 MG/0.4ML ~~LOC~~ SOLN
40.0000 mg | SUBCUTANEOUS | Status: AC
  Administered 2019-01-31: 06:00:00 40 mg via SUBCUTANEOUS
  Filled 2019-01-31: qty 0.4

## 2019-01-31 MED ORDER — ONDANSETRON HCL 4 MG/2ML IJ SOLN
INTRAMUSCULAR | Status: AC
  Filled 2019-01-31: qty 2

## 2019-01-31 MED ORDER — FENTANYL CITRATE (PF) 100 MCG/2ML IJ SOLN
INTRAMUSCULAR | Status: AC
  Filled 2019-01-31: qty 4

## 2019-01-31 MED ORDER — PROPOFOL 10 MG/ML IV BOLUS
INTRAVENOUS | Status: AC
  Filled 2019-01-31: qty 20

## 2019-01-31 MED ORDER — FENTANYL CITRATE (PF) 100 MCG/2ML IJ SOLN
25.0000 ug | INTRAMUSCULAR | Status: DC | PRN
  Administered 2019-01-31 (×2): 50 ug via INTRAVENOUS

## 2019-01-31 MED ORDER — MIDAZOLAM HCL 5 MG/5ML IJ SOLN
INTRAMUSCULAR | Status: DC | PRN
  Administered 2019-01-31: 2 mg via INTRAVENOUS

## 2019-01-31 MED ORDER — DEXAMETHASONE SODIUM PHOSPHATE 10 MG/ML IJ SOLN
INTRAMUSCULAR | Status: DC | PRN
  Administered 2019-01-31: 10 mg via INTRAVENOUS

## 2019-01-31 MED ORDER — LACTATED RINGERS IR SOLN
Status: DC | PRN
  Administered 2019-01-31: 1000 mL

## 2019-01-31 MED ORDER — LIDOCAINE 2% (20 MG/ML) 5 ML SYRINGE
INTRAMUSCULAR | Status: DC | PRN
  Administered 2019-01-31: 4.05 mL/h via INTRAVENOUS

## 2019-01-31 MED ORDER — DEXAMETHASONE SODIUM PHOSPHATE 4 MG/ML IJ SOLN: 4.0000 mg | INTRAMUSCULAR | Status: DC

## 2019-01-31 MED ORDER — CELECOXIB 200 MG PO CAPS
400.0000 mg | ORAL_CAPSULE | ORAL | Status: AC
  Administered 2019-01-31: 400 mg via ORAL
  Filled 2019-01-31: qty 2

## 2019-01-31 MED ORDER — SODIUM CHLORIDE 0.9% FLUSH: 3.0000 mL | INTRAVENOUS | Status: DC | PRN

## 2019-01-31 MED ORDER — FENTANYL CITRATE (PF) 250 MCG/5ML IJ SOLN
INTRAMUSCULAR | Status: AC
  Filled 2019-01-31: qty 5

## 2019-01-31 MED ORDER — SODIUM CHLORIDE 0.9 % IV SOLN: 250.0000 mL | INTRAVENOUS | Status: DC | PRN

## 2019-01-31 MED ORDER — HYDROMORPHONE HCL 2 MG PO TABS
ORAL_TABLET | ORAL | Status: AC
  Administered 2019-01-31: 2 mg via ORAL
  Filled 2019-01-31: qty 1

## 2019-01-31 MED ORDER — MIDAZOLAM HCL 2 MG/2ML IJ SOLN
INTRAMUSCULAR | Status: AC
  Filled 2019-01-31: qty 2

## 2019-01-31 MED ORDER — LIDOCAINE HCL 2 % IJ SOLN
INTRAMUSCULAR | Status: AC
  Filled 2019-01-31: qty 40

## 2019-01-31 MED ORDER — SCOPOLAMINE 1 MG/3DAYS TD PT72
1.0000 | MEDICATED_PATCH | TRANSDERMAL | Status: DC
  Administered 2019-01-31: 06:00:00 1.5 mg via TRANSDERMAL
  Filled 2019-01-31: qty 1

## 2019-01-31 MED ORDER — ACETAMINOPHEN 650 MG RE SUPP
650.0000 mg | RECTAL | Status: DC | PRN
  Filled 2019-01-31: qty 1

## 2019-01-31 MED ORDER — ROCURONIUM BROMIDE 10 MG/ML (PF) SYRINGE
PREFILLED_SYRINGE | INTRAVENOUS | Status: DC | PRN
  Administered 2019-01-31: 20 mg via INTRAVENOUS
  Administered 2019-01-31: 50 mg via INTRAVENOUS

## 2019-01-31 MED ORDER — HYDROMORPHONE HCL 1 MG/ML IJ SOLN
INTRAMUSCULAR | Status: AC
  Filled 2019-01-31: qty 1

## 2019-01-31 MED ORDER — LIDOCAINE 2% (20 MG/ML) 5 ML SYRINGE
INTRAMUSCULAR | Status: DC | PRN
  Administered 2019-01-31: 80 mg via INTRAVENOUS

## 2019-01-31 MED ORDER — BUPIVACAINE HCL (PF) 0.25 % IJ SOLN
INTRAMUSCULAR | Status: AC
  Filled 2019-01-31: qty 30

## 2019-01-31 MED ORDER — KETAMINE HCL 10 MG/ML IJ SOLN
INTRAMUSCULAR | Status: DC | PRN
  Administered 2019-01-31: 30 mg via INTRAVENOUS

## 2019-01-31 MED ORDER — HYDROMORPHONE HCL 2 MG PO TABS
2.0000 mg | ORAL_TABLET | ORAL | Status: DC | PRN
  Administered 2019-01-31: 11:00:00 2 mg via ORAL

## 2019-01-31 MED ORDER — HYDROMORPHONE HCL 1 MG/ML IJ SOLN
0.5000 mg | INTRAMUSCULAR | Status: DC | PRN
  Administered 2019-01-31: 0.5 mg via INTRAVENOUS

## 2019-01-31 MED ORDER — PROPOFOL 10 MG/ML IV BOLUS
INTRAVENOUS | Status: DC | PRN
  Administered 2019-01-31: 200 mg via INTRAVENOUS

## 2019-01-31 MED ORDER — GABAPENTIN 300 MG PO CAPS
300.0000 mg | ORAL_CAPSULE | ORAL | Status: AC
  Administered 2019-01-31: 300 mg via ORAL
  Filled 2019-01-31: qty 1

## 2019-01-31 MED ORDER — ACETAMINOPHEN 500 MG PO TABS
1000.0000 mg | ORAL_TABLET | ORAL | Status: DC
  Filled 2019-01-31: qty 2

## 2019-01-31 MED ORDER — PHENYLEPHRINE 40 MCG/ML (10ML) SYRINGE FOR IV PUSH (FOR BLOOD PRESSURE SUPPORT)
PREFILLED_SYRINGE | INTRAVENOUS | Status: DC | PRN
  Administered 2019-01-31: 80 ug via INTRAVENOUS
  Administered 2019-01-31: 40 ug via INTRAVENOUS
  Administered 2019-01-31 (×2): 80 ug via INTRAVENOUS

## 2019-01-31 MED ORDER — SODIUM CHLORIDE 0.9% FLUSH: 3.0000 mL | Freq: Two times a day (BID) | INTRAVENOUS | Status: DC

## 2019-01-31 SURGICAL SUPPLY — 71 items
ADH SKN CLS APL DERMABOND .7 (GAUZE/BANDAGES/DRESSINGS) ×1
AGENT HMST KT MTR STRL THRMB (HEMOSTASIS)
APL ESCP 34 STRL LF DISP (HEMOSTASIS)
APPLICATOR SURGIFLO ENDO (HEMOSTASIS) IMPLANT
BAG LAPAROSCOPIC 12 15 PORT 16 (BASKET) IMPLANT
BAG RETRIEVAL 12/15 (BASKET)
BAG SPEC RTRVL LRG 6X4 10 (ENDOMECHANICALS)
BLADE SURG SZ10 CARB STEEL (BLADE) IMPLANT
COVER BACK TABLE 60X90IN (DRAPES) ×2 IMPLANT
COVER TIP SHEARS 8 DVNC (MISCELLANEOUS) ×1 IMPLANT
COVER TIP SHEARS 8MM DA VINCI (MISCELLANEOUS) ×1
COVER WAND RF STERILE (DRAPES) IMPLANT
DECANTER SPIKE VIAL GLASS SM (MISCELLANEOUS) IMPLANT
DERMABOND ADVANCED (GAUZE/BANDAGES/DRESSINGS) ×1
DERMABOND ADVANCED .7 DNX12 (GAUZE/BANDAGES/DRESSINGS) ×1 IMPLANT
DRAPE ARM DVNC X/XI (DISPOSABLE) ×4 IMPLANT
DRAPE COLUMN DVNC XI (DISPOSABLE) ×1 IMPLANT
DRAPE DA VINCI XI ARM (DISPOSABLE) ×4
DRAPE DA VINCI XI COLUMN (DISPOSABLE) ×1
DRAPE SHEET LG 3/4 BI-LAMINATE (DRAPES) ×2 IMPLANT
DRAPE SURG IRRIG POUCH 19X23 (DRAPES) ×2 IMPLANT
DRSG OPSITE POSTOP 4X6 (GAUZE/BANDAGES/DRESSINGS) IMPLANT
DRSG OPSITE POSTOP 4X8 (GAUZE/BANDAGES/DRESSINGS) IMPLANT
ELECT REM PT RETURN 15FT ADLT (MISCELLANEOUS) ×2 IMPLANT
GLOVE BIO SURGEON STRL SZ 6 (GLOVE) ×8 IMPLANT
GLOVE BIO SURGEON STRL SZ 6.5 (GLOVE) ×2 IMPLANT
GOWN STRL REUS W/ TWL LRG LVL3 (GOWN DISPOSABLE) ×4 IMPLANT
GOWN STRL REUS W/TWL LRG LVL3 (GOWN DISPOSABLE) ×8
HOLDER FOLEY CATH W/STRAP (MISCELLANEOUS) ×2 IMPLANT
IRRIG SUCT STRYKERFLOW 2 WTIP (MISCELLANEOUS) ×2
IRRIGATION SUCT STRKRFLW 2 WTP (MISCELLANEOUS) ×1 IMPLANT
KIT PROCEDURE DA VINCI SI (MISCELLANEOUS)
KIT PROCEDURE DVNC SI (MISCELLANEOUS) IMPLANT
KIT TURNOVER KIT A (KITS) IMPLANT
MANIPULATOR UTERINE 4.5 ZUMI (MISCELLANEOUS) ×2 IMPLANT
NDL SPNL 18GX3.5 QUINCKE PK (NEEDLE) IMPLANT
NEEDLE HYPO 22GX1.5 SAFETY (NEEDLE) ×1 IMPLANT
NEEDLE SPNL 18GX3.5 QUINCKE PK (NEEDLE) IMPLANT
OBTURATOR OPTICAL STANDARD 8MM (TROCAR) ×1
OBTURATOR OPTICAL STND 8 DVNC (TROCAR) ×1
OBTURATOR OPTICALSTD 8 DVNC (TROCAR) ×1 IMPLANT
PACK ROBOT GYN CUSTOM WL (TRAY / TRAY PROCEDURE) ×2 IMPLANT
PAD POSITIONING PINK XL (MISCELLANEOUS) ×2 IMPLANT
PENCIL SMOKE EVACUATOR (MISCELLANEOUS) IMPLANT
PORT ACCESS TROCAR AIRSEAL 12 (TROCAR) ×1 IMPLANT
PORT ACCESS TROCAR AIRSEAL 5M (TROCAR) ×1
POUCH SPECIMEN RETRIEVAL 10MM (ENDOMECHANICALS) IMPLANT
RETRACT II ENDO 10MM 32CML (ENDOMECHANICALS)
RETRACTOR II ENDO 10MM 32CML (ENDOMECHANICALS) IMPLANT
RETRACTOR LAPSCP 12X46 CVD (ENDOMECHANICALS) IMPLANT
RTRCTR LAPSCP 12X46 CVD (ENDOMECHANICALS) ×2
SEAL CANN UNIV 5-8 DVNC XI (MISCELLANEOUS) ×3 IMPLANT
SEAL XI 5MM-8MM UNIVERSAL (MISCELLANEOUS) ×3
SET TRI-LUMEN FLTR TB AIRSEAL (TUBING) ×2 IMPLANT
SPONGE LAP 18X18 X RAY DECT (DISPOSABLE) IMPLANT
SURGIFLO W/THROMBIN 8M KIT (HEMOSTASIS) IMPLANT
SUT MNCRL AB 4-0 PS2 18 (SUTURE) IMPLANT
SUT PDS AB 1 TP1 96 (SUTURE) IMPLANT
SUT VIC AB 0 CT1 27 (SUTURE)
SUT VIC AB 0 CT1 27XBRD ANTBC (SUTURE) IMPLANT
SUT VIC AB 2-0 CT1 27 (SUTURE)
SUT VIC AB 2-0 CT1 TAPERPNT 27 (SUTURE) IMPLANT
SUT VICRYL 4-0 PS2 18IN ABS (SUTURE) ×4 IMPLANT
SYR 10ML LL (SYRINGE) IMPLANT
TOWEL OR NON WOVEN STRL DISP B (DISPOSABLE) ×2 IMPLANT
TRAP SPECIMEN MUCOUS 40CC (MISCELLANEOUS) IMPLANT
TRAY FOLEY MTR SLVR 16FR STAT (SET/KITS/TRAYS/PACK) ×2 IMPLANT
TROCAR XCEL NON-BLD 5MMX100MML (ENDOMECHANICALS) IMPLANT
UNDERPAD 30X36 HEAVY ABSORB (UNDERPADS AND DIAPERS) ×2 IMPLANT
WATER STERILE IRR 1000ML POUR (IV SOLUTION) ×2 IMPLANT
YANKAUER SUCT BULB TIP 10FT TU (MISCELLANEOUS) IMPLANT

## 2019-01-31 NOTE — Discharge Instructions (Signed)
Return to work: 4 weeks (2 weeks with physical restrictions).  Activity: 1. Be up and out of the bed during the day.  Take a nap if needed.  You may walk up steps but be careful and use the hand rail.  Stair climbing will tire you more than you think, you may need to stop part way and rest.   2. No lifting or straining for 4 weeks.  3. No driving for 1 weeks.  Do Not drive if you are taking narcotic pain medicine.  4. Shower daily.  Use soap and water on your incision and pat dry; don't rub.   5. No sexual activity and nothing in the vagina for 8 weeks.  Medications:  - Take ibuprofen and tylenol first line for pain control. Take these regularly (every 6 hours) to decrease the build up of pain.  - If necessary, for severe pain not relieved by ibuprofen, contact Dr Rossi's office and you will be prescribed percocet.  - While taking percocet you should take sennakot every night to reduce the likelihood of constipation. If this causes diarrhea, stop its use.  Diet: 1. Low sodium Heart Healthy Diet is recommended.  2. It is safe to use a laxative if you have difficulty moving your bowels.   Wound Care: 1. Keep clean and dry.  Shower daily.  Reasons to call the Doctor:   Fever - Oral temperature greater than 100.4 degrees Fahrenheit  Foul-smelling vaginal discharge  Difficulty urinating  Nausea and vomiting  Increased pain at the site of the incision that is unrelieved with pain medicine.  Difficulty breathing with or without chest pain  New calf pain especially if only on one side  Sudden, continuing increased vaginal bleeding with or without clots.   Follow-up: 1. See Emma Rossi in 4 weeks.  Contacts: For questions or concerns you should contact:  Dr. Emma Rossi at [phone] After hours and on week-ends call [phone] and ask to speak to the physician on call for Gynecologic Oncology  After Your Surgery  The information in this section will tell you what  to expect after your surgery, both during your stay and after you leave. You will learn how to safely recover from your surgery. Write down any questions you have and be sure to ask your doctor or nurse.  What to Expect When you wake up after your surgery, you will be in the Post-Anesthesia Care Unit (PACU) or your recovery room. A nurse will be monitoring your body temperature, blood pressure, pulse, and oxygen levels. You may have a urinary catheter in your bladder to help monitor the amount of urine you are making. It should come out before you go home. You will also have compression boots on your lower legs to help your circulation. Your pain medication will be given through an IV line or in tablet form. If you are having pain, tell your nurse. Your nurse will tell you how to recover from your surgery. Below are examples of ways you can help yourself recover safely. . You will be encouraged to walk with the help of your nurse or physical therapist. We will give you medication to relieve pain. Walking helps reduce the risk for blood clots and pneumonia. It also helps to stimulate your bowels so they begin working again. . Use your incentive spirometer. This will help your lungs expand, which prevents pneumonia.   Commonly Asked Questions  Will I have pain after surgery? Yes, you will have some   pain after your surgery, especially in the first few days. Your doctor and nurse will ask you about your pain often. You will be given medication to manage your pain as needed. If your pain is not relieved, please tell your doctor or nurse. It is important to control your pain so you can cough, breathe deeply, use your incentive spirometer, and get out of bed and walk.  Will I be able to eat? Yes, you will be able to eat a regular diet or eat as tolerated. You should start with foods that are soft and easy to digest such as apple sauce and chicken noodle soup. Eat small meals frequently, and then advance  to regular foods. If you experience bloating, gas, or cramps, limit high-fiber foods, including whole grain breads and cereal, nuts, seeds, salads, fresh fruit, broccoli, cabbage, and cauliflower. Will I have pain when I am home? The length of time each person has pain or discomfort varies. You may still have some pain when you go home and will probably be taking pain medication. Follow the guidelines below. . Take your medications as directed and as needed. . Call your doctor if the medication prescribed for you doesn't relieve your pain. . Don't drive or drink alcohol while you're taking prescription pain medication. . As your incision heals, you will have less pain and need less pain medication. A mild pain reliever such as acetaminophen (Tylenol) or ibuprofen (Advil) will relieve aches and discomfort. However, large quantities of acetaminophen may be harmful to your liver. Don't take more acetaminophen than the amount directed on the bottle or as instructed by your doctor or nurse. . Pain medication should help you as you resume your normal activities. Take enough medication to do your exercises comfortably. Pain medication is most effective 30 to 45 minutes after taking it. . Keep track of when you take your pain medication. Taking it when your pain first begins is more effective than waiting for the pain to get worse. Pain medication may cause constipation (having fewer bowel movements than what is normal for you).  How can I prevent constipation? . Go to the bathroom at the same time every day. Your body will get used to going at that time. . If you feel the urge to go, don't put it off. Try to use the bathroom 5 to 15 minutes after meals. . After breakfast is a good time to move your bowels. The reflexes in your colon are strongest at this time. . Exercise, if you can. Walking is an excellent form of exercise. . Drink 8 (8-ounce) glasses (2 liters) of liquids daily, if you can. Drink  water, juices, soups, ice cream shakes, and other drinks that don't have caffeine. Drinks with caffeine, such as coffee and soda, pull fluid out of the body. . Slowly increase the fiber in your diet to 25 to 35 grams per day. Fruits, vegetables, whole grains, and cereals contain fiber. If you have an ostomy or have had recent bowel surgery, check with your doctor or nurse before making any changes in your diet. . Both over-the-counter and prescription medications are available to treat constipation. Start with 1 of the following over-the-counter medications first: o Docusate sodium (Colace) 100 mg. Take ___1__ capsules _2____ times a day. This is a stool softener that causes few side effects. Don't take it with mineral oil. o Polyethylene glycol (MiraLAX) 17 grams daily. o Senna (Senokot) 2 tablets at bedtime. This is a stimulant laxative, which can cause cramping. .   If you haven't had a bowel movement in 2 days, call your doctor or nurse.  Can I shower? Yes, you should shower 24 hours after your surgery. Be sure to shower every day. Taking a warm shower is relaxing and can help decrease muscle aches. Use soap when you shower and gently wash your incision. Pat the areas dry with a towel after showering, and leave your incision uncovered (unless there is drainage). Call your doctor if you see any redness or drainage from your incision. Don't take tub baths until you discuss it with your doctor at the first appointment after your surgery. How do I care for my incisions? You will have several small incisions on your abdomen. The incisions are closed with Steri-Strips or Dermabond. You may also have square white dressings on your incisions (Primapore). You can remove these in the shower 24 hours after your surgery. You should clean your incisions with soap and water. If you go home with Steri-Strips on your incision, they will loosen and may fall off by themselves. If they haven't fallen off within 10  days, you can remove them. If you go home with Dermabond over your sutures (stitches), it will also loosen and peel off.  What are the most common symptoms after a hysterectomy? It's common for you to have some vaginal spotting or light bleeding. You should monitor this with a pad or a panty liner. If you have having heavy bleeding (bleeding through a pad or liner every 1 to 2 hours), call your doctor right away. It's also common to have some discomfort after surgery from the air that was pumped into your abdomen during surgery. To help with this, walk, drink plenty of liquids and make sure to take the stool softeners you received.  When is it safe for me to drive? You may resume driving 2 weeks after surgery, as long as you are not taking pain medication that may make you drowsy.  When can I resume sexual activity? Do not place anything in your vagina or have vaginal intercourse for 8 weeks after your surgery. Some people will need to wait longer than 8 weeks, so speak with your doctor before resuming sexual intercourse.  Will I be able to travel? Yes, you can travel. If you are traveling by plane within a few weeks after your surgery, make sure you get up and walk every hour. Be sure to stretch your legs, drink plenty of liquids, and keep your feet elevated when possible.  Will I need any supplies? Most people do not need any supplies after the surgery. In the rare case that you do need supplies, such as tubes or drains, your nurse will order them for you.  When can I return to work? The time it takes to return to work depends on the type of work you do, the type of surgery you had, and how fast your body heals. Most people can return to work about 2 to 4 weeks after the surgery.  What exercises can I do? Exercise will help you gain strength and feel better. Walking and stair climbing are excellent forms of exercise. Gradually increase the distance you walk. Climb stairs slowly, resting or  stopping as needed. Ask your doctor or nurse before starting more strenuous exercises.  When can I lift heavy objects? Most people should not lift anything heavier than 10 pounds (4.5 kilograms) for at least 4 weeks after surgery. Speak with your doctor about when you can do heavy lifting.    How can I cope with my feelings? After surgery for a serious illness, you may have new and upsetting feelings. Many people say they felt weepy, sad, worried, nervous, irritable, and angry at one time or another. You may find that you can't control some of these feelings. If this happens, it's a good idea to seek emotional support. The first step in coping is to talk about how you feel. Family and friends can help. Your nurse, doctor, and social worker can reassure, support, and guide you. It's always a good idea to let these professionals know how you, your family, and your friends are feeling emotionally. Many resources are available to patients and their families. Whether you're in the hospital or at home, the nurses, doctors, and social workers are here to help you and your family and friends handle the emotional aspects of your illness.  When is my first appointment after surgery? Your first appointment after surgery will be 2 to 4 weeks after surgery. Your nurse will give you instructions on how to make this appointment, including the phone number to call.  What if I have other questions? If you have any questions or concerns, please talk with your doctor or nurse. You can reach them Monday through Friday from 9:00 am to 5:00 pm. After 5:00 pm, during the weekend, and on holidays, call [phone] and ask for the doctor on call for your doctor.  . Have a temperature of 101 F (38.3 C) or higher . Have pain that does not get better with pain medication . Have redness, drainage, or swelling from your incisions 

## 2019-01-31 NOTE — Transfer of Care (Signed)
Immediate Anesthesia Transfer of Care Note  Patient: Mary Herman  Procedure(s) Performed: XI ROBOTIC ASSISTED TOTAL HYSTERECTOMY WITH BILATERAL SALPINGO OOPHORECTOMY (N/A )  Patient Location: PACU  Anesthesia Type:General  Level of Consciousness: awake, alert  and oriented  Airway & Oxygen Therapy: Patient Spontanous Breathing and Patient connected to face mask oxygen  Post-op Assessment: Report given to RN and Post -op Vital signs reviewed and stable  Post vital signs: Reviewed and stable  Last Vitals:  Vitals Value Taken Time  BP 157/89 01/31/19 0932  Temp    Pulse 82 01/31/19 0933  Resp 18 01/31/19 0933  SpO2 96 % 01/31/19 0933  Vitals shown include unvalidated device data.  Last Pain:  Vitals:   01/31/19 0607  TempSrc:   PainSc: 4          Complications: No apparent anesthesia complications

## 2019-01-31 NOTE — Progress Notes (Signed)
SAts 98 on room air, pt awake and alert

## 2019-01-31 NOTE — Op Note (Signed)
OPERATIVE NOTE 01/31/19  Surgeon: Donaciano Eva   Assistants: Dr Lahoma Crocker (an MD assistant was necessary for tissue manipulation, management of robotic instrumentation, retraction and positioning due to the complexity of the case and hospital policies).   Anesthesia: General endotracheal anesthesia  ASA Class: 3   Pre-operative Diagnosis: complex atypical hyperplasia, morbid obesity (class 3 obesity with BMI of 54kg/m2).  Post-operative Diagnosis: same  Operation: Robotic-assisted laparoscopic total hysterectomy with bilateral salpingoophorectomy (22 modifier for extreme obesity Extreme morbid obesity requiring additional OR personnel for positioning and retraction. Obesity made retroperitoneal visualization limited and increased the complexity of the case and necessitated additional instrumentation for retraction. Obesity related complexity increased the duration of the procedure by 35 minutes).  Surgeon: Donaciano Eva  Assistant Surgeon: Lahoma Crocker MD  Anesthesia: GET  Urine Output: 100cc  Operative Findings:  : high volume fatty visceral tissue including in the retroperitoneum and anterior cul de sac. Large volume intestinal matter limiting visualization of pelvic structures. Omental dense adhesion in the epigastric abdomen. Normal appearing uterus with tubes and ovaries.   Estimated Blood Loss:  10cc      Total IV Fluids: 800 ml         Specimens: uterus with cervix and bilateral tubes and ovaries         Complications:  None; patient tolerated the procedure well.         Disposition: PACU - hemodynamically stable.  Procedure Details  The patient was seen in the Holding Room. The risks, benefits, complications, treatment options, and expected outcomes were discussed with the patient.  The patient concurred with the proposed plan, giving informed consent.  The site of surgery properly noted/marked. The patient was identified as Mary Herman  and the procedure verified as a Robotic-assisted hysterectomy with bilateral salpingo oophorectomy. A Time Out was held and the above information confirmed.  After induction of anesthesia, the patient was draped and prepped in the usual sterile manner. Pt was placed in supine position after anesthesia and draped and prepped in the usual sterile manner. Additional personnel was required to position the patient due to her size. The abdominal drape was placed after the CholoraPrep had been allowed to dry for 3 minutes.  Her arms were tucked to her side with all appropriate precautions.  The shoulders were stabilized with padded shoulder blocks applied to the acromium processes.  The patient was placed in the semi-lithotomy position in [place].  The perineum was prepped with Betadine. The patient was then prepped. Foley catheter was placed.  A sterile speculum was placed in the vagina.  The cervix was grasped with a single-tooth tenaculum and dilated with Kennon Rounds dilators.  The ZUMI uterine manipulator with a medium colpotomizer ring was placed without difficulty.  A pneum occluder balloon was placed over the manipulator.  OG tube placement was confirmed and to suction.   Next, a 5 mm skin incision was made 1 cm below the subcostal margin in the midclavicular line.  The 5 mm Optiview port and scope was used for direct entry.  Opening pressure was under 10 mm CO2.  The abdomen was insufflated and the findings were noted as above.   At this point and all points during the procedure, the patient's intra-abdominal pressure did not exceed 15 mmHg. Next, a 10 mm skin incision was made a handspan above the umbilicus and a right and left port was placed about 10 cm lateral to the robot port on the right and left  side.  Due to extreme visceral obesity a fourth arm was placed in the left lower quadrant 2 cm above and superior and medial to the anterior superior iliac spine. This was to be used for an endoscopic paddle  and therefore an additional 67mm assistant port was also placed in the left upper quadrant to manipulate the pelvic and bowel structures to obtain visualization. All ports were placed under direct visualization.  The patient was placed in steep Trendelenburg.  Bowel was folded away into the upper abdomen.  The robot was docked in the normal manner.  The hysterectomy was started after the round ligament on the right side was incised and the retroperitoneum was entered and the pararectal space was developed.  The ureter was noted to be on the medial leaf of the broad ligament.  The peritoneum above the ureter was incised and stretched and the infundibulopelvic ligament was skeletonized, cauterized and cut.  The posterior peritoneum was taken down to the level of the KOH ring.  The anterior peritoneum was also taken down.  The bladder flap was created to the level of the KOH ring.  The uterine artery on the right side was skeletonized, cauterized and cut in the normal manner.  A similar procedure was performed on the left.  The colpotomy was made and the uterus, cervix, bilateral ovaries and tubes were amputated and delivered through the vagina.  Pedicles were inspected and excellent hemostasis was achieved.    The colpotomy at the vaginal cuff was closed with Vicryl on a CT1 needle in a running manner.  Irrigation was used and excellent hemostasis was achieved.  At this point in the procedure was completed.  Robotic instruments were removed under direct visulaization.  The robot was undocked. The 10 mm ports were closed with Vicryl on a UR-5 needle and the fascia was closed with 0 Vicryl on a UR-5 needle.  The skin was closed with 4-0 Vicryl in a subcuticular manner.  Dermabond was applied.  Sponge, lap and needle counts correct x 2.  The patient was taken to the recovery room in stable condition.  The vagina was swabbed with  minimal bleeding noted.   All instrument and needle counts were correct x  3.   The  patient was transferred to the recovery room in a stable condition.  Donaciano Eva, MD

## 2019-01-31 NOTE — H&P (Signed)
H&P Note: Gyn-Onc  Consult was requested by Dr. Phineas Real for the evaluation of Mary Herman 58 y.o. female  CC:  Complex atypical hyperplasia  Assessment/Plan:  Ms. WALTER MIN  is a 58 y.o.  year old with morbid obesity (BMI 51kg/m2) and complex atypical endometrial hyperplasia.   I discussed with the patient that she has 2 options:  1/ progestin therapy either oral or via IUD (this could be placed in the OR with D&C).  I discussed that this option carries with it the least risk to the patient and is typically highly effective at ameliorating symptoms and potentially reversing the hyperplasia.  Its downside is that it does not definitively provide hysterectomy specimen for evaluation for occult malignancy.  2/hysterectomy with BSO as part of definitive surgery.  I explained to Mary that she is at particularly high risk for major morbidity due to her extreme obesity.  I explained that this morbidity also includes inability to perform will complete the procedure due to intolerance of Trendelenburg positioning which is necessary to gain access to pelvic viscera.  I explained that in the manipulation of bowel tissue which is most significant in obese patients that she has an increased risk for bowel injury or bladder injury or ureteral injury.  I explained that risk of major complication is substantially higher in patients who morbidly obese compared to normal weight patients.    After hearing this the patient elected to proceed with robotic hysterectomy and BSO because she felt that she " just wanted to get everything out".  I reiterated that she understood that this met assuming greatest surgical risk with this particular option.  She vocalized that she understood this.  We reviewed perioperative planning including anticipation of an outpatient surgical procedure with same-day discharge and ERS pathway.  She takes Celebrex which she can continue to take preoperatively.  We will avoid  ibuprofen for her in addition to Celebrex.  We discussed anticipated postoperative recovery and restrictions.  Surgery is scheduled for December.  She will stay on progesterone until that time.   HPI: Ms. Deshara Laelynn Herman is a 58 year old P1 who is seen in consultation at the request of Dr. Phineas Real for evaluation of complex atypical hyperplasia in the setting of morbid obesity.  The patient has a BMI of 51 kg per metered squared.  She has a remote history of a laparoscopic gastric bypass surgery with [place] surgery in 2009.  This initially resulted in a weight loss of 160 pounds, however she then gained all the weight back in recent years.  She began experiencing vaginal bleeding approximately 3 years prior to presentation.  She did not seek medical care for this as she lacked health insurance.  When she gained health insurance she was seen by Dr. Phineas Real for her vaginal bleeding.  An ultrasound scan was performed on 06/21/2017 which did not reveal dimensions of the uterus but did identify several small myomas with the largest measuring 15 mm.  The endometrial echo was 7 mm with an 8 mm focus within the endometrium.  The ovaries were not visualized.  The cul-de-sac was negative.  The sonohysterogram was performed and demonstrated findings consistent with a polyp.  She underwent hysteroscopic a D&C with polypectomy on November 25, 2018.  This revealed an endometrial type polyp with complex atypical hyperplasia.  Pap smear in 2019 was benign.  The patient's medical history is significant for morbid obesity with associated comorbidities of hypertension hypothyroidism and hypercholesterolemia.  She reports that she  does not have diabetes or prediabetes.  She has had 1 prior cesarean section and no vaginal births.  She has had a laparoscopic cholecystectomy in the past and a laparoscopic gastric bypass in 2009 which was uncomplicated.  Currently she is working from home due to the coronavirus.   She works on a Teaching laboratory technician.  She has no family history significant for malignancies.  Current Meds:  Outpatient Encounter Medications as of 12/13/2018  Medication Sig  . acetaminophen (TYLENOL) 500 MG tablet Take 1,000 mg by mouth every 6 (six) hours as needed for moderate pain or headache.  Marland Kitchen buPROPion (WELLBUTRIN) 75 MG tablet TAKE 2 TABLETS BY MOUTH TWICE A DAY  . celecoxib (CELEBREX) 200 MG capsule Take 1 capsule (200 mg total) by mouth 2 (two) times daily.  . citalopram (CELEXA) 20 MG tablet TAKE 1 TABLET BY MOUTH EVERY DAY IN THE MORNING  . cyclobenzaprine (FLEXERIL) 10 MG tablet TAKE 1 TABLET BY MOUTH EVERYDAY AT BEDTIME  . diazepam (VALIUM) 5 MG tablet Take 1 tablet (5 mg total) by mouth at bedtime as needed. (Patient taking differently: Take 5 mg by mouth at bedtime. )  . ENBREL SURECLICK 50 MG/ML injection Inject 50 mg as directed every Sunday.  . levocetirizine (XYZAL) 5 MG tablet Take 5 mg by mouth daily.  Marland Kitchen levothyroxine (SYNTHROID) 75 MCG tablet TAKE 1 TABLET BY MOUTH EVERY DAY  . Melatonin 10 MG CAPS Take 10 mg by mouth at bedtime as needed (sleep).  . metoprolol succinate (TOPROL-XL) 25 MG 24 hr tablet TAKE 1 TABLET BY MOUTH EVERY DAY (Patient taking differently: Take 25 mg by mouth at bedtime. )  . montelukast (SINGULAIR) 10 MG tablet Take 1 tablet (10 mg total) by mouth at bedtime.  . Multiple Vitamin (MULTIVITAMIN) capsule Take 1 capsule by mouth at bedtime.   Marland Kitchen OVER THE COUNTER MEDICATION Take 2 tablets by mouth at bedtime. Bariatric Iron  . rosuvastatin (CRESTOR) 5 MG tablet TAKE ONE TABLET BY MOUTH THREE TIMES A WEEK (Patient taking differently: Take 5 mg by mouth every Monday, Wednesday, and Friday. )  . traMADol (ULTRAM) 50 MG tablet Take 1-2 tablets (50-100 mg total) by mouth every 8 (eight) hours as needed. (Patient taking differently: Take 50-100 mg by mouth every 8 (eight) hours as needed for moderate pain. )  . VITAMIN D PO Take 2 capsules by mouth at bedtime.   No  facility-administered encounter medications on file as of 12/13/2018.     Allergy:  Allergies  Allergen Reactions  . Codeine Rash    Tolerates hydrocodone     Social Hx:   Social History   Socioeconomic History  . Marital status: Married    Spouse name: Not on file  . Number of children: Not on file  . Years of education: Not on file  . Highest education level: Not on file  Occupational History  . Not on file  Social Needs  . Financial resource strain: Not on file  . Food insecurity    Worry: Not on file    Inability: Not on file  . Transportation needs    Medical: Not on file    Non-medical: Not on file  Tobacco Use  . Smoking status: Never Smoker  . Smokeless tobacco: Never Used  Substance and Sexual Activity  . Alcohol use: No  . Drug use: No  . Sexual activity: Not Currently    Partners: Male    Birth control/protection: Surgical    Comment: husband vasectomy-1st intercourse  26 yo-Fewer than 5 partners  Lifestyle  . Physical activity    Days per week: Not on file    Minutes per session: Not on file  . Stress: Not on file  Relationships  . Social Herbalist on phone: Not on file    Gets together: Not on file    Attends religious service: Not on file    Active member of club or organization: Not on file    Attends meetings of clubs or organizations: Not on file    Relationship status: Not on file  . Intimate partner violence    Fear of current or ex partner: Not on file    Emotionally abused: Not on file    Physically abused: Not on file    Forced sexual activity: Not on file  Other Topics Concern  . Not on file  Social History Narrative  . Not on file    Past Surgical Hx:  Past Surgical History:  Procedure Laterality Date  . CARDIAC CATHETERIZATION  12-07-2006  DR [facility]   NORMAL CORONARIES ARTERIES/ NORMAL LVF/ MILD INCREASED END-DIASTOLIC PRESSURE  . CESAREAN SECTION  1998  . CHOLECYSTECTOMY  1980'S  . CYSTOSCOPY WITH RETROGRADE  PYELOGRAM, URETEROSCOPY AND STENT PLACEMENT Bilateral 12/26/2012   Procedure: cystoscopy with bilateral retrogrades, bilateral ureteroscopy ;  Surgeon: Bernestine Amass, MD;  Location: [facility];  Service: Urology;  Laterality: Bilateral;  . CYSTOSCOPY WITH STENT PLACEMENT Bilateral 12/26/2012   Procedure: CYSTOSCOPY WITH STENT PLACEMENT;  Surgeon: Bernestine Amass, MD;  Location: [facility];  Service: Urology;  Laterality: Bilateral;  . CYSTOSCOPY/ HYDRODISTENTION  YRS AGO  . DILATATION & CURETTAGE/HYSTEROSCOPY WITH MYOSURE N/A 11/25/2018   Procedure: [place];  Surgeon: Anastasio Auerbach, MD;  Location: [place];  Service: Gynecology;  Laterality: N/A;  . HERNIA REPAIR    . HOLMIUM LASER APPLICATION Right 38/08/5641   Procedure: HOLMIUM LASER APPLICATION;  Surgeon: Bernestine Amass, MD;  Location: [facility];  Service: Urology;  Laterality: Right;  . KNEE ARTHROSCOPY Left 1992  . ROUX-EN-Y PROCEDURE  07-25-2007  . SHOULDER ARTHROSCOPY Right 2011  . TMJ ARTHROPLASTY  1990'S   BILATERAL UPPER    Past Medical Hx:  Past Medical History:  Diagnosis Date  . Anemia   . Chronic back pain   . Depression   . Dyspnea    with activity  . Environmental allergies   . Family history of adverse reaction to anesthesia    daughter- n/v      . Fibroid   . GERD (gastroesophageal reflux disease)   . Hematuria   . History of cystitis    INTERSTITIAL CYSTITIS--  NO ISSUES FOR YRS  . History of gastroesophageal reflux (GERD)    DENIES ISSUES SINCE GASTRIC BY PASS  . History of hypertension    NO ISSUE SINCE WT LOSS AFTER GASTRIC BYPASS  . History of kidney stones   . History of obstructive sleep apnea    USED CPAP--  NO ISSUE SINCE WT LOSS AFTER GASTRIC BYPASS  . Hypertension   . Pneumonia   . Pre-diabetes    pt. denies  . Rheumatoid arthritis ([place])   . Right ureteral stone   . Sleep apnea    no cpap   . Thyroid  disease   . Trochanteric bursitis of both hips     Past Gynecological History:  See HPI. C/s x 1 Patient's last menstrual period was  05/25/2014.  Family Hx:  Family History  Problem Relation Age of Onset  . Hypertension Mother   . Stroke Mother   . Hypertension Brother   . Heart disease Maternal Uncle   . Diabetes Maternal Grandmother   . Cancer Maternal Grandmother        THROAT CA  . Cancer Maternal Grandfather        THROAT CA    Review of Systems:  Constitutional  Feels well,    ENT Normal appearing ears and nares bilaterally Skin/Breast  No rash, sores, jaundice, itching, dryness Cardiovascular  No chest pain, shortness of breath, or edema  Pulmonary  No cough or wheeze.  Gastro Intestinal  No nausea, vomitting, or diarrhoea. No bright red blood per rectum, no abdominal pain, change in bowel movement, or constipation.  Genito Urinary  No frequency, urgency, dysuria, + postmenopausal bleeding Musculo Skeletal  No myalgia, arthralgia, joint swelling or pain  Neurologic  No weakness, numbness, change in gait,  Psychology  No depression, anxiety, insomnia.   Vitals:  Blood pressure (!) 146/106, pulse 90, temperature 98.5 F (36.9 C), temperature source Oral, resp. rate 18, last menstrual period 05/25/2014, SpO2 97 %.  Physical Exam: WD in NAD Neck  Supple NROM, without any enlargements.  Lymph Node Survey No cervical supraclavicular or inguinal adenopathy Cardiovascular  Pulse normal rate, regularity and rhythm. S1 and S2 normal.  Lungs  Clear to auscultation bilateraly, without wheezes/crackles/rhonchi. Good air movement.  Skin  +candida in skin folds.  Psychiatry  Alert and oriented to person, place, and time  Abdomen  Normoactive bowel sounds, abdomen soft, non-tender and obese with pannus and evidence of hernia.  Back No CVA tenderness Genito Urinary  Vulva/vagina: Normal external female genitalia.  No lesions. No discharge or  bleeding.  Bladder/urethra:  No lesions or masses, well supported bladder  Vagina: very long, unable to visualize cervix  Cervix: palpably normal  Uterus: unable to clearly delineate uterine size, mobile, no parametrial involvement or nodularity.  Adnexa: no discretely palpable masses. Rectal  deferred Extremities  No bilateral cyanosis, clubbing or edema.   Thereasa Solo, MD  01/31/2019, 7:06 AM

## 2019-01-31 NOTE — Anesthesia Postprocedure Evaluation (Signed)
Anesthesia Post Note  Patient: Mary Herman  Procedure(s) Performed: XI ROBOTIC ASSISTED TOTAL HYSTERECTOMY WITH BILATERAL SALPINGO OOPHORECTOMY (N/A )     Patient location during evaluation: PACU Anesthesia Type: General Level of consciousness: awake and alert Pain management: pain level controlled Vital Signs Assessment: post-procedure vital signs reviewed and stable Respiratory status: spontaneous breathing, nonlabored ventilation, respiratory function stable and patient connected to nasal cannula oxygen Cardiovascular status: blood pressure returned to baseline and stable Postop Assessment: no apparent nausea or vomiting Anesthetic complications: no    Last Vitals:  Vitals:   01/31/19 1105 01/31/19 1152  BP: 139/75   Pulse: 71   Resp: 16   Temp: 36.5 C   SpO2: 98% (!) 86%    Last Pain:  Vitals:   01/31/19 1145  TempSrc:   PainSc: 4                  Atul Delucia COKER

## 2019-01-31 NOTE — Progress Notes (Signed)
Pulsox check [id number], deep breathing resolves desaturation, but desatuarates when sleeping.  [place] 2L begun, sats 92

## 2019-01-31 NOTE — Anesthesia Procedure Notes (Signed)
Procedure Name: Intubation Performed by: Gean Maidens, CRNA Pre-anesthesia Checklist: Patient identified, Emergency Drugs available, Suction available, Patient being monitored and Timeout performed Patient Re-evaluated:Patient Re-evaluated prior to induction Oxygen Delivery Method: Circle system utilized Preoxygenation: Pre-oxygenation with 100% oxygen Induction Type: IV induction Ventilation: Mask ventilation without difficulty Laryngoscope Size: Mac and 4 Grade View: Grade I Tube type: Oral Tube size: 7.0 mm Number of attempts: 1 Airway Equipment and Method: Stylet Placement Confirmation: ETT inserted through vocal cords under direct vision,  positive ETCO2 and breath sounds checked- equal and bilateral Secured at: 21 cm Tube secured with: Tape Dental Injury: Teeth and Oropharynx as per pre-operative assessment

## 2019-01-31 NOTE — Anesthesia Preprocedure Evaluation (Signed)
Anesthesia Evaluation  Patient identified by MRN, date of birth, ID band Patient awake    Reviewed: Allergy & Precautions, NPO status , Patient's Chart, lab work & pertinent test results  Airway Mallampati: II  TM Distance: >3 FB Neck ROM: Full    Dental  (+) Teeth Intact, Dental Advisory Given   Pulmonary    breath sounds clear to auscultation       Cardiovascular hypertension,  Rhythm:Regular Rate:Normal     Neuro/Psych    GI/Hepatic   Endo/Other    Renal/GU      Musculoskeletal   Abdominal (+) + obese,   Peds  Hematology   Anesthesia Other Findings   Reproductive/Obstetrics                             Anesthesia Physical Anesthesia Plan  ASA: III  Anesthesia Plan: General   Post-op Pain Management:    Induction: Intravenous  PONV Risk Score and Plan: Ondansetron and Dexamethasone  Airway Management Planned: Oral ETT  Additional Equipment:   Intra-op Plan:   Post-operative Plan: Extubation in OR  Informed Consent: I have reviewed the patients History and Physical, chart, labs and discussed the procedure including the risks, benefits and alternatives for the proposed anesthesia with the patient or authorized representative who has indicated his/her understanding and acceptance.     Dental advisory given  Plan Discussed with: CRNA and Anesthesiologist  Anesthesia Plan Comments:         Anesthesia Quick Evaluation  

## 2019-02-01 ENCOUNTER — Encounter (HOSPITAL_COMMUNITY): Payer: Self-pay | Admitting: Gynecologic Oncology

## 2019-02-01 ENCOUNTER — Telehealth: Payer: Self-pay | Admitting: *Deleted

## 2019-02-01 NOTE — Telephone Encounter (Signed)
Pt states that she is feeling "pretty good" this morning. Pt rates pain at 3.5/10. She taking tylenol for pain and took 1 oxycodone. Pt tolerating fluids and solid food. Denies nausea. Having some burning with urination, pt encouraged to drink at least 64oz of water per day. She denies fever or chills. Pt is not passing gas or has had a BM yet. Pt reports that she forgot to take [place] last night, but will take this morning. She reports incisions to be C/D/I. Pt given phone number to clinic and encouraged her to call with any questions or concerns. Confirmed upcoming appointment on 03/01/18 at 1:30 with patient.

## 2019-02-02 ENCOUNTER — Ambulatory Visit: Payer: BC Managed Care – PPO | Admitting: Gynecologic Oncology

## 2019-02-02 LAB — SURGICAL PATHOLOGY

## 2019-02-03 ENCOUNTER — Telehealth: Payer: Self-pay

## 2019-02-03 NOTE — Telephone Encounter (Signed)
Told Mary Herman that the surgical pathology showed hyperplasia(pre-cancer) seen. No cancer seen. Pt verbalized understanding.

## 2019-02-08 DIAGNOSIS — R5382 Chronic fatigue, unspecified: Secondary | ICD-10-CM | POA: Diagnosis not present

## 2019-02-08 DIAGNOSIS — M2550 Pain in unspecified joint: Secondary | ICD-10-CM | POA: Diagnosis not present

## 2019-02-08 DIAGNOSIS — M0579 Rheumatoid arthritis with rheumatoid factor of multiple sites without organ or systems involvement: Secondary | ICD-10-CM | POA: Diagnosis not present

## 2019-02-22 NOTE — Progress Notes (Signed)
   Subjective:    Patient ID: Mary Herman, female    DOB: 1960/10/01, 58 y.o.   MRN: [id number]  HPI 58 year old female in today for 23-month follow-up.  History of vitamin D deficiency, hyperlipidemia, hypothyroidism, situational stress, palpitations.  History of rheumatoid arthritis treated by [facility].  Is on Celexa and Wellbutrin for depression.  Takes Enbrel for Rheumatoid arthritis.  Tells me today she is going to have to have laparoscopic total hysterectomy with bilateral salpingo-oophorectomy.  History of complex atypical endometrial hyperplasia.  Patient has history of morbid obesity with BMI of 48.10 and weight is 298 pounds  Her hemoglobin A1c in October was stable at 5.8%.  Lipid panel and liver functions are normal.  She had a D&C in October by Dr. Phineas Real and was referred to Dr. Denman George.  I believe Dr. Denman George wants her to check with me regarding the surgery.  Is pretty clear that patient wants to have the surgery despite risk  History of hypothyroidism.  Last TSH checked in August and was normal on current dose of thyroid replacement Review of Systems is fatigued and concerned about upcoming surgery     Objective:   Physical Exam  Neck is supple without JVD thyromegaly or carotid bruits.  Chest clear to auscultation.  Cardiac exam regular rate and rhythm normal S1 and S2.  Extremities without pitting edema.        Assessment & Plan:  Morbid obesity-hopefully after hysterectomy would be more motivated to diet exercise and lose weight  Atypical endometrial hyperplasia-I tend to agree with her it would probably be best to undergo the hysterectomy although with her morbid obesity and other medical issues there is increased morbidity with general anesthesia and will need recovery time but she says she has time off work available to her.  Hyperlipidemia-treated with statin-Crestor 5 mg three times a week  Impaired glucose tolerance-stable  History of  gastric bypass surgery in the remote past  Rheumatoid arthritis treated by rheumatologist with minimal  Anxiety , depression and situational stress-stable on Celexa and Wellbutrin  Hypothyroidism-TSH checked in August is normal and stable on thyroid replacement medication  Chronic musculoskeletal pain treated with Flexeril and also has taken Celebrex  Insomnia treated with Valium 5 mg at bedtime  Allergic rhinitis treated with Singulair  History of palpitations treated with metoprolol  Plan: Patient desires probiotic surgery for atypical endometrial hyperplasia with hysterectomy and BSO.

## 2019-02-22 NOTE — Patient Instructions (Addendum)
Labs reviewed.  Discussion held about upcoming surgery which she desires.  She is due for health maintenance exam February 2021

## 2019-02-25 ENCOUNTER — Other Ambulatory Visit: Payer: Self-pay | Admitting: Internal Medicine

## 2019-02-27 ENCOUNTER — Other Ambulatory Visit: Payer: Self-pay

## 2019-02-27 MED ORDER — METOPROLOL SUCCINATE ER 25 MG PO TB24: ORAL_TABLET | ORAL | 3 refills | Status: DC

## 2019-03-02 ENCOUNTER — Inpatient Hospital Stay: Payer: BC Managed Care – PPO | Attending: Gynecologic Oncology | Admitting: Gynecologic Oncology

## 2019-03-02 ENCOUNTER — Encounter: Payer: Self-pay | Admitting: Gynecologic Oncology

## 2019-03-02 ENCOUNTER — Other Ambulatory Visit: Payer: Self-pay

## 2019-03-02 VITALS — BP 145/100 | HR 89 | Temp 97.7°F | Resp 17 | Ht 64.0 in | Wt 315.0 lb

## 2019-03-02 DIAGNOSIS — N8502 Endometrial intraepithelial neoplasia [EIN]: Secondary | ICD-10-CM

## 2019-03-02 DIAGNOSIS — Z90710 Acquired absence of both cervix and uterus: Secondary | ICD-10-CM

## 2019-03-02 NOTE — Patient Instructions (Signed)
There was no cancer in the uterine specimen. The uterus with its cervix and both tubes and ovaries were removed.   You do not need routine gynecologic check ups moving forwards.  It is reasonable to see Dr Renold Genta for routine wellness care.  You are cleared for all activities.  If you have questions related to your surgery please call Dr Denman George at [phone].

## 2019-03-02 NOTE — Progress Notes (Signed)
Postop Follow-up Note: Gyn-Onc  Consult was initially requested by Dr. Phineas Real for the evaluation of Mary Herman 59 y.o. female  CC:  Chief Complaint  Patient presents with  . Complex atypical endometrial hyperplasia    Assessment/Plan:  [name]  is a 59 y.o.  year old with morbid obesity (BMI 51kg/m2) and complex atypical endometrial hyperplasia s/p robotic hysterectomy and BSO on 01/31/19.   She has healed well from surgery and is cleared to resume steroids for her arthritis.   No gyn follow-up necessary. She will follow-up with her PCP.    HPI: [name] is a 59 year old P1 who is seen in consultation at the request of Dr. Phineas Real for evaluation of complex atypical hyperplasia in the setting of morbid obesity.  The patient has a BMI of 51 kg per metered squared.  She has a remote history of a laparoscopic gastric bypass surgery with [facility] in 2009.  This initially resulted in a weight loss of 160 pounds, however she then gained all the weight back in recent years.  She began experiencing vaginal bleeding approximately 3 years prior to presentation.  She did not seek medical care for this as she lacked health insurance.  When she gained health insurance she was seen by Dr. Phineas Real for her vaginal bleeding.  An ultrasound scan was performed on 06/21/2017 which did not reveal dimensions of the uterus but did identify several small myomas with the largest measuring 15 mm.  The endometrial echo was 7 mm with an 8 mm focus within the endometrium.  The ovaries were not visualized.  The cul-de-sac was negative.  The sonohysterogram was performed and demonstrated findings consistent with a polyp.  She underwent hysteroscopic a D&C with polypectomy on November 25, 2018.  This revealed an endometrial type polyp with complex atypical hyperplasia.  Pap smear in 2019 was benign.  The patient's medical history is significant for morbid obesity with  associated comorbidities of hypertension hypothyroidism and hypercholesterolemia.  She reports that she does not have diabetes or prediabetes.  She has had 1 prior cesarean section and no vaginal births.  She has had a laparoscopic cholecystectomy in the past and a laparoscopic gastric bypass in 2009 which was uncomplicated.  Currently she is working from home due to the coronavirus.  She works on a Teaching laboratory technician.  She has no family history significant for malignancies.  Interval Hx:  On 01/31/19 she underwent a robotic assisted total hysterectomy and BSO.  Surgery was complicated by challenges of morbid obesity.  Final pathology confirmed complex atypical endometrial hyperplasia but no invasive carcinoma.  The ovaries and fallopian tubes were benign.  Since surgery she has done well with no specific complaints.  Her arthritis is flaring somewhat since she has been off steroids for a while in preparation for surgery.  Current Meds:  Outpatient Encounter Medications as of 03/02/2019  Medication Sig  . acetaminophen (TYLENOL) 500 MG tablet Take 1,500 mg by mouth 2 (two) times daily as needed for moderate pain or headache.   Marland Kitchen buPROPion (WELLBUTRIN) 75 MG tablet TAKE 2 TABLETS BY MOUTH TWICE A DAY (Patient taking differently: Take 150 mg by mouth 2 (two) times daily. )  . celecoxib (CELEBREX) 200 MG capsule TAKE 1 CAPSULE BY MOUTH TWICE A DAY  . Cholecalciferol (VITAMIN D) 50 MCG (2000 UT) tablet Take 2,000 Units by mouth at bedtime.  . citalopram (CELEXA) 20 MG tablet TAKE 1 TABLET BY MOUTH EVERY DAY IN THE MORNING (  Patient taking differently: Take 20 mg by mouth daily. )  . clotrimazole-betamethasone (LOTRISONE) cream Apply 1 application topically 2 (two) times daily. (Patient taking differently: Apply 1 application topically daily as needed (mouth breaking out). )  . cyclobenzaprine (FLEXERIL) 10 MG tablet TAKE 1 TABLET BY MOUTH EVERYDAY AT BEDTIME (Patient taking differently: Take 10 mg by mouth at  bedtime. )  . diazepam (VALIUM) 5 MG tablet Take 1 tablet (5 mg total) by mouth at bedtime as needed. (Patient taking differently: Take 5 mg by mouth at bedtime. )  . ENBREL SURECLICK 50 MG/ML injection Inject 50 mg as directed every Sunday.  . levocetirizine (XYZAL) 5 MG tablet Take 5 mg by mouth daily.  Marland Kitchen levothyroxine (SYNTHROID) 75 MCG tablet TAKE 1 TABLET BY MOUTH EVERY DAY (Patient taking differently: Take 75 mcg by mouth daily. )  . Melatonin 10 MG CAPS Take 10 mg by mouth at bedtime.   . Menthol, Topical Analgesic, (BLUE-EMU MAXIMUM STRENGTH EX) Apply 1 application topically daily as needed (pain).  . metoprolol succinate (TOPROL-XL) 25 MG 24 hr tablet TAKE 1 TABLET BY MOUTH EVERY DAY  . montelukast (SINGULAIR) 10 MG tablet Take 1 tablet (10 mg total) by mouth at bedtime.  . Multiple Vitamin (MULTIVITAMIN) capsule Take 1 capsule by mouth daily.   Marland Kitchen nystatin (MYCOSTATIN/NYSTOP) powder Apply topically 2 (two) times daily. (Patient taking differently: Apply 1 g topically 2 (two) times daily. )  . OVER THE COUNTER MEDICATION Take 1 tablet by mouth at bedtime. Bariatric Iron   . rosuvastatin (CRESTOR) 5 MG tablet TAKE 1 TABLET BY MOUTH 3 TIMES A WEEK (Patient taking differently: Take 5 mg by mouth every Monday, Wednesday, and Friday. )  . senna-docusate (SENOKOT-S) 8.6-50 MG tablet Take 2 tablets by mouth at bedtime. For AFTER surgery, do not take if having diarrhea  . traMADol (ULTRAM) 50 MG tablet Take 1-2 tablets (50-100 mg total) by mouth every 8 (eight) hours as needed.  . [DISCONTINUED] oxyCODONE (OXY IR/ROXICODONE) 5 MG immediate release tablet Take 1 tablet (5 mg total) by mouth every 4 (four) hours as needed for severe pain. For AFTER surgery, do not take and drive (Patient not taking: Reported on 03/02/2019)   No facility-administered encounter medications on file as of 03/02/2019.    Allergy:  Allergies  Allergen Reactions  . Codeine Rash    Tolerates hydrocodone     Social Hx:    Social History   Socioeconomic History  . Marital status: Married    Spouse name: Not on file  . Number of children: Not on file  . Years of education: Not on file  . Highest education level: Not on file  Occupational History  . Not on file  Tobacco Use  . Smoking status: Never Smoker  . Smokeless tobacco: Never Used  Substance and Sexual Activity  . Alcohol use: No  . Drug use: No  . Sexual activity: Not Currently    Partners: Male    Birth control/protection: Surgical    Comment: husband vasectomy-1st intercourse 26 yo-Fewer than 5 partners  Other Topics Concern  . Not on file  Social History Narrative  . Not on file   Social Determinants of Health   Financial Resource Strain:   . Difficulty of Paying Living Expenses: Not on file  Food Insecurity:   . Worried About Charity fundraiser in the Last Year: Not on file  . Ran Out of Food in the Last Year: Not on file  Transportation  Needs:   . Lack of Transportation (Medical): Not on file  . Lack of Transportation (Non-Medical): Not on file  Physical Activity:   . Days of Exercise per Week: Not on file  . Minutes of Exercise per Session: Not on file  Stress:   . Feeling of Stress : Not on file  Social Connections:   . Frequency of Communication with Friends and Family: Not on file  . Frequency of Social Gatherings with Friends and Family: Not on file  . Attends Religious Services: Not on file  . Active Member of Clubs or Organizations: Not on file  . Attends Archivist Meetings: Not on file  . Marital Status: Not on file  Intimate Partner Violence:   . Fear of Current or Ex-Partner: Not on file  . Emotionally Abused: Not on file  . Physically Abused: Not on file  . Sexually Abused: Not on file    Past Surgical Hx:  Past Surgical History:  Procedure Laterality Date  . CARDIAC CATHETERIZATION  12-07-2006  DR [facility]   NORMAL CORONARIES ARTERIES/ NORMAL LVF/ MILD INCREASED END-DIASTOLIC PRESSURE  .  CESAREAN SECTION  1998  . CHOLECYSTECTOMY  1980'S  . CYSTOSCOPY WITH RETROGRADE PYELOGRAM, URETEROSCOPY AND STENT PLACEMENT Bilateral 12/26/2012   Procedure: cystoscopy with bilateral retrogrades, bilateral ureteroscopy ;  Surgeon: Bernestine Amass, MD;  [facility]  Service: Urology;  Laterality: Bilateral;  . CYSTOSCOPY WITH STENT PLACEMENT Bilateral 12/26/2012   Procedure: CYSTOSCOPY WITH STENT PLACEMENT;  Surgeon: Bernestine Amass, MD;  Location: [facility];  Service: Urology;  Laterality: Bilateral;  . CYSTOSCOPY/ HYDRODISTENTION  YRS AGO  . DILATATION & CURETTAGE/HYSTEROSCOPY WITH MYOSURE N/A 11/25/2018   Procedure: [place];  Surgeon: Anastasio Auerbach, MD;  Location: [place];  Service: Gynecology;  Laterality: N/A;  . HERNIA REPAIR    . HOLMIUM LASER APPLICATION Right 0000000   Procedure: HOLMIUM LASER APPLICATION;  Surgeon: Bernestine Amass, MD;  Location: [facility];  Service: Urology;  Laterality: Right;  . KNEE ARTHROSCOPY Left 1992  . ROBOTIC ASSISTED TOTAL HYSTERECTOMY WITH BILATERAL SALPINGO OOPHERECTOMY N/A 01/31/2019   Procedure: XI ROBOTIC ASSISTED TOTAL HYSTERECTOMY WITH BILATERAL SALPINGO OOPHORECTOMY;  Surgeon: Everitt Amber, MD;  Location: [facility];  Service: Gynecology;  Laterality: N/A;  . ROUX-EN-Y PROCEDURE  07-25-2007  . SHOULDER ARTHROSCOPY Right 2011  . TMJ ARTHROPLASTY  1990'S   BILATERAL UPPER    Past Medical Hx:  Past Medical History:  Diagnosis Date  . Anemia   . Chronic back pain   . Depression   . Dyspnea    with activity  . Environmental allergies   . Family history of adverse reaction to anesthesia    daughter- n/v      . Fibroid   . GERD (gastroesophageal reflux disease)   . Hematuria   . History of cystitis    INTERSTITIAL CYSTITIS--  NO ISSUES FOR YRS  . History of gastroesophageal reflux (GERD)    DENIES ISSUES SINCE GASTRIC BY PASS  . History of  hypertension    NO ISSUE SINCE WT LOSS AFTER GASTRIC BYPASS  . History of kidney stones   . History of obstructive sleep apnea    USED CPAP--  NO ISSUE SINCE WT LOSS AFTER GASTRIC BYPASS  . Hypertension   . Pneumonia   . Pre-diabetes    pt. denies  . Rheumatoid arthritis ([place])   . Right ureteral stone   . Sleep  apnea    no cpap   . Thyroid disease   . Trochanteric bursitis of both hips     Past Gynecological History:  See HPI. C/s x 1 Patient's last menstrual period was 05/25/2014.  Family Hx:  Family History  Problem Relation Age of Onset  . Hypertension Mother   . Stroke Mother   . Hypertension Brother   . Heart disease Maternal Uncle   . Diabetes Maternal Grandmother   . Cancer Maternal Grandmother        THROAT CA  . Cancer Maternal Grandfather        THROAT CA    Review of Systems:  Constitutional  Feels well,    ENT Normal appearing ears and nares bilaterally Skin/Breast  No rash, sores, jaundice, itching, dryness Cardiovascular  No chest pain, shortness of breath, or edema  Pulmonary  No cough or wheeze.  Gastro Intestinal  No nausea, vomitting, or diarrhoea. No bright red blood per rectum, no abdominal pain, change in bowel movement, or constipation.  Genito Urinary  No frequency, urgency, dysuria, + postmenopausal bleeding Musculo Skeletal  No myalgia, arthralgia, joint swelling or pain  Neurologic  No weakness, numbness, change in gait,  Psychology  No depression, anxiety, insomnia.   Vitals:  Blood pressure (!) 145/100, pulse 89, temperature 97.7 F (36.5 C), temperature source Temporal, resp. rate 17, height 5\' 4"  (1.626 m), weight (!) 315 lb (142.9 kg), last menstrual period 05/25/2014, SpO2 98 %.  Physical Exam: WD in NAD Neck  Supple NROM, without any enlargements.  Lymph Node Survey No cervical supraclavicular or inguinal adenopathy Cardiovascular  Pulse normal rate, regularity and rhythm. S1 and S2 normal.  Lungs  Clear to  auscultation bilateraly, without wheezes/crackles/rhonchi. Good air movement.  Skin  +candida in skin folds.  Psychiatry  Alert and oriented to person, place, and time  Abdomen  Normoactive bowel sounds, abdomen soft, non-tender and obese with pannus and evidence of hernia. Well healed incisions.  Back No CVA tenderness Genito Urinary  Vaginal cuff in tact, no bleeding, no lesions.  Rectal  deferred Extremities  No bilateral cyanosis, clubbing or edema.   Thereasa Solo, MD  03/02/2019, 2:07 PM

## 2019-04-05 ENCOUNTER — Telehealth: Payer: Self-pay | Admitting: Physician Assistant

## 2019-04-05 MED ORDER — HYDROCODONE-ACETAMINOPHEN 5-325 MG PO TABS: 1.0000 | ORAL_TABLET | Freq: Four times a day (QID) | ORAL | 0 refills | Status: DC | PRN

## 2019-04-05 NOTE — Telephone Encounter (Signed)
Please advise 

## 2019-04-05 NOTE — Telephone Encounter (Signed)
I sent some into her pharmacy.

## 2019-04-05 NOTE — Telephone Encounter (Signed)
Patient called needing Rx refilled Hydrocodone. The number to contact patient is [phone]    [facility] [place]. Patient asked for a call when Rx is called into the pharmacy.

## 2019-04-10 ENCOUNTER — Encounter: Payer: Self-pay | Admitting: Physician Assistant

## 2019-04-10 ENCOUNTER — Ambulatory Visit: Payer: Self-pay

## 2019-04-10 ENCOUNTER — Ambulatory Visit: Payer: 59 | Admitting: Physician Assistant

## 2019-04-10 ENCOUNTER — Other Ambulatory Visit: Payer: Self-pay

## 2019-04-10 VITALS — Ht 62.8 in | Wt 321.4 lb

## 2019-04-10 DIAGNOSIS — M25562 Pain in left knee: Secondary | ICD-10-CM

## 2019-04-10 DIAGNOSIS — M25561 Pain in right knee: Secondary | ICD-10-CM | POA: Diagnosis not present

## 2019-04-10 MED ORDER — LIDOCAINE HCL 1 % IJ SOLN
0.5000 mL | INTRAMUSCULAR | Status: AC | PRN
  Administered 2019-04-10: .5 mL

## 2019-04-10 MED ORDER — METHYLPREDNISOLONE ACETATE 40 MG/ML IJ SUSP
40.0000 mg | INTRAMUSCULAR | Status: AC | PRN
  Administered 2019-04-10: 40 mg via INTRA_ARTICULAR

## 2019-04-10 NOTE — Progress Notes (Signed)
Office Visit Note   Patient: Mary Herman           Date of Birth: [birth year]           MRN: [id number] Visit Date: 04/10/2019              Requested by: Elby Showers, MD [address] [place],  [place] [id number] PCP: Elby Showers, MD   Assessment & Plan: Visit Diagnoses:  1. Left knee pain, unspecified chronicity   2. Right knee pain, unspecified chronicity     Plan: She will follow-up as needed understands that she needs to wait least 3 months between injections.  She states she has been using hydrocodone for the pain and I informed her that she can call when she needs this we could do 1 additional refill.  Questions encouraged and answered  Follow-Up Instructions: Return if symptoms worsen or fail to improve.   Orders:  Orders Placed This Encounter  Procedures  . XR Knee 1-2 Views Right  . XR Knee 1-2 Views Left   No orders of the defined types were placed in this encounter.     Procedures: Large Joint Inj: bilateral knee on 04/10/2019 9:19 AM Indications: pain Details: 22 G 1.5 in needle, anterolateral approach  Arthrogram: No  Medications (Right): 0.5 mL lidocaine 1 %; 40 mg methylPREDNISolone acetate 40 MG/ML Medications (Left): 0.5 mL lidocaine 1 %; 40 mg methylPREDNISolone acetate 40 MG/ML Outcome: tolerated well, no immediate complications Procedure, treatment alternatives, risks and benefits explained, specific risks discussed. Consent was given by the patient. Immediately prior to procedure a time out was called to verify the correct patient, procedure, equipment, support staff and site/side marked as required. Patient was prepped and draped in the usual sterile fashion.       Clinical Data: No additional findings.   Subjective: Chief Complaint  Patient presents with  . Right Knee - Pain  . Left Knee - Pain    HPI Mary Herman comes in today due to bilateral knee pain.  States the injections back in September gave her good relief.   However over the last 2 weeks she has developed anterior knee pain some sharp stabbing pain in the right knee.  Left knee she is having mostly posterior knee pain.  Pain is worse when getting up from seated position.  Taking occasional hydrocodone.  She did have to come off of her Enbrel due to the fact that she underwent hysterectomy.  She states being off the Enbrel caused her hand pain and her knee pain become worse.  She has severe end-stage arthritis of both knees. Review of Systems Negative for fevers or chills.  Please see HPI otherwise negative  Objective: Vital Signs: Ht 5' 2.8" (1.595 m)   Wt (!) 321 lb 6.4 oz (145.8 kg)   LMP 05/25/2014   BMI 57 kg/m   Physical Exam Constitutional:      Appearance: She is not ill-appearing or diaphoretic.  Pulmonary:     Effort: Pulmonary effort is normal.  Neurological:     Mental Status: She is alert.  Psychiatric:        Mood and Affect: Mood normal.     Ortho Exam Bilateral knees no abnormal warmth erythema.  Good range of motion both knees.  Considerable Tello femoral crepitus left knee with passive range of motion.  No gross instability of either knee with valgus varus stressing.   Specialty Comments:  No specialty comments available.  Imaging: No  results found.   PMFS History: Patient Active Problem List   Diagnosis Date Noted  . Complex atypical endometrial hyperplasia 01/31/2019  . Morbid obesity ([place]) 10/11/2018  . Rheumatoid arthritis ([place]) 12/19/2016  . OA (osteoarthritis) of knee 04/07/2016  . Hypothyroidism 01/22/2015  . Chronic low back pain 01/22/2015  . Depression 01/22/2015  . Allergic rhinitis 01/22/2015  . Ureteral calculus 12/26/2012  . Personal history of gastric bypass 11/12/2012  . Hyperlipidemia    Past Medical History:  Diagnosis Date  . Anemia   . Chronic back pain   . Depression   . Dyspnea    with activity  . Environmental allergies   . Family history of adverse reaction to anesthesia     daughter- n/v      . Fibroid   . GERD (gastroesophageal reflux disease)   . Hematuria   . History of cystitis    INTERSTITIAL CYSTITIS--  NO ISSUES FOR YRS  . History of gastroesophageal reflux (GERD)    DENIES ISSUES SINCE GASTRIC BY PASS  . History of hypertension    NO ISSUE SINCE WT LOSS AFTER GASTRIC BYPASS  . History of kidney stones   . History of obstructive sleep apnea    USED CPAP--  NO ISSUE SINCE WT LOSS AFTER GASTRIC BYPASS  . Hypertension   . Pneumonia   . Pre-diabetes    pt. denies  . Rheumatoid arthritis ([place])   . Right ureteral stone   . Sleep apnea    no cpap   . Thyroid disease   . Trochanteric bursitis of both hips     Family History  Problem Relation Age of Onset  . Hypertension Mother   . Stroke Mother   . Hypertension Brother   . Heart disease Maternal Uncle   . Diabetes Maternal Grandmother   . Cancer Maternal Grandmother        THROAT CA  . Cancer Maternal Grandfather        THROAT CA    Past Surgical History:  Procedure Laterality Date  . CARDIAC CATHETERIZATION  12-07-2006  DR [facility]   NORMAL CORONARIES ARTERIES/ NORMAL LVF/ MILD INCREASED END-DIASTOLIC PRESSURE  . CESAREAN SECTION  1998  . CHOLECYSTECTOMY  1980'S  . CYSTOSCOPY WITH RETROGRADE PYELOGRAM, URETEROSCOPY AND STENT PLACEMENT Bilateral 12/26/2012   Procedure: cystoscopy with bilateral retrogrades, bilateral ureteroscopy ;  Surgeon: Bernestine Amass, MD;  [facility];  Service: Urology;  Laterality: Bilateral;  . CYSTOSCOPY WITH STENT PLACEMENT Bilateral 12/26/2012   Procedure: CYSTOSCOPY WITH STENT PLACEMENT;  Surgeon: Bernestine Amass, MD;  Location[facility];  Service: Urology;  Laterality: Bilateral;  . CYSTOSCOPY/ HYDRODISTENTION  YRS AGO  . DILATATION & CURETTAGE/HYSTEROSCOPY WITH MYOSURE N/A 11/25/2018   Procedure: [place];  Surgeon: Anastasio Auerbach, MD;  Location: [place];  Service:  Gynecology;  Laterality: N/A;  . HERNIA REPAIR    . HOLMIUM LASER APPLICATION Right 0000000   Procedure: HOLMIUM LASER APPLICATION;  Surgeon: Bernestine Amass, MD;  Location: [facility];  Service: Urology;  Laterality: Right;  . KNEE ARTHROSCOPY Left 1992  . ROBOTIC ASSISTED TOTAL HYSTERECTOMY WITH BILATERAL SALPINGO OOPHERECTOMY N/A 01/31/2019   Procedure: XI ROBOTIC ASSISTED TOTAL HYSTERECTOMY WITH BILATERAL SALPINGO OOPHORECTOMY;  Surgeon: Everitt Amber, MD;  Location: [facility];  Service: Gynecology;  Laterality: N/A;  . ROUX-EN-Y PROCEDURE  07-25-2007  . SHOULDER ARTHROSCOPY Right 2011  . TMJ ARTHROPLASTY  1990'S   BILATERAL UPPER  Social History   Occupational History  . Not on file  Tobacco Use  . Smoking status: Never Smoker  . Smokeless tobacco: Never Used  Substance and Sexual Activity  . Alcohol use: No  . Drug use: No  . Sexual activity: Not Currently    Partners: Male    Birth control/protection: Surgical    Comment: husband vasectomy-1st intercourse 26 yo-Fewer than 5 partners

## 2019-05-08 ENCOUNTER — Ambulatory Visit (INDEPENDENT_AMBULATORY_CARE_PROVIDER_SITE_OTHER): Payer: 59 | Admitting: Internal Medicine

## 2019-05-08 ENCOUNTER — Encounter: Payer: Self-pay | Admitting: Internal Medicine

## 2019-05-08 ENCOUNTER — Other Ambulatory Visit: Payer: Self-pay

## 2019-05-08 VITALS — BP 120/88 | HR 105 | Temp 97.8°F | Ht 63.0 in | Wt 304.0 lb

## 2019-05-08 DIAGNOSIS — M170 Bilateral primary osteoarthritis of knee: Secondary | ICD-10-CM

## 2019-05-08 DIAGNOSIS — F439 Reaction to severe stress, unspecified: Secondary | ICD-10-CM

## 2019-05-08 DIAGNOSIS — E559 Vitamin D deficiency, unspecified: Secondary | ICD-10-CM

## 2019-05-08 DIAGNOSIS — F419 Anxiety disorder, unspecified: Secondary | ICD-10-CM

## 2019-05-08 DIAGNOSIS — Z0000 Encounter for general adult medical examination without abnormal findings: Secondary | ICD-10-CM

## 2019-05-08 DIAGNOSIS — E039 Hypothyroidism, unspecified: Secondary | ICD-10-CM

## 2019-05-08 DIAGNOSIS — Z9884 Bariatric surgery status: Secondary | ICD-10-CM

## 2019-05-08 DIAGNOSIS — R8290 Unspecified abnormal findings in urine: Secondary | ICD-10-CM | POA: Diagnosis not present

## 2019-05-08 DIAGNOSIS — R7302 Impaired glucose tolerance (oral): Secondary | ICD-10-CM

## 2019-05-08 DIAGNOSIS — F329 Major depressive disorder, single episode, unspecified: Secondary | ICD-10-CM

## 2019-05-08 DIAGNOSIS — Z1159 Encounter for screening for other viral diseases: Secondary | ICD-10-CM

## 2019-05-08 DIAGNOSIS — M059 Rheumatoid arthritis with rheumatoid factor, unspecified: Secondary | ICD-10-CM

## 2019-05-08 DIAGNOSIS — E785 Hyperlipidemia, unspecified: Secondary | ICD-10-CM | POA: Diagnosis not present

## 2019-05-08 LAB — POCT URINALYSIS DIPSTICK
Bilirubin, UA: NEGATIVE
Blood, UA: NEGATIVE
Glucose, UA: NEGATIVE
Ketones, UA: NEGATIVE
Nitrite, UA: NEGATIVE
Protein, UA: POSITIVE — AB
Spec Grav, UA: 1.02 (ref 1.010–1.025)
Urobilinogen, UA: 0.2 E.U./dL
pH, UA: 6 (ref 5.0–8.0)

## 2019-05-08 NOTE — Progress Notes (Signed)
Subjective:    Patient ID: Mary Herman, female    DOB: 11-05-1960, 59 y.o.   MRN: [id number]  HPI  59 year old Female for health maintenance exam and evaluation of medical issues.  History of morbid obesity, rheumatoid arthritis.  History of hypothyroidism, depression, allergic rhinitis, chronic back pain and vitamin D deficiency.  History of TMJ syndrome.  Status post cholecystectomy.  Had C-section 1998.  Had arthroscopic surgery of left knee over 25 years ago.  Codeine causes a rash  She is allergic to cats and has cats in her home.  Had surgery for complex atypical endometrial hyperplasia including Total abdominal hysterectomy BSO with no cancer being found.  Had gastric bypass surgery in 2009 by Dr. Hassell Done. Offered Dr. Migdalia Dk clinic as an option for weight loss and offered to refer her back to Dr. Hassell Done regarding gastric bypass surgery to see if there is additional option for surgery.  She is not really motivated to exercise and/or diet.  Currently working from home on her property in another building as husband is also at home.  Does not think she will be returning to [place] downtown office until late Fall at the earliest.  History of rheumatoid arthritis treated at [facility].  Social history: She is married.  Husband is disabled.  Has a high school education.  Works at [employer].  Does not smoke or consume alcohol.  She has 1 daughter who has had a number of operations on her femur.  Patient has had considerable stress and financial issues over the past few years.  Family history: Father died at age 12 with history of alcoholism but the cause of death not known to patient.  Mother with history of brain aneurysm 10 years prior to her death.  Mother died at age 13 with history of stroke and had lung disease.  No sisters.  1 brother age 61 with history of hypertension.  1 brother in his 35s his health status not known to patient.    Review of  Systems joint pain from RA and deconditioning, chronic musculoskeletal pain in joints and back.  Anxiety and depression.  Situational stress.     Objective:   Physical Exam BP 120/88 pulse 105  Weighed 315 pounds at GYN in January.  Weight today 304 pounds.  BMI 53.85.  Temperature 97.8 degrees pulse oximetry 98%  Skin warm and dry.  Nodes none.  TMs clear.  Neck is supple without thyromegaly.  No carotid bruits.  Chest clear to auscultation without rales or wheezing.  Breast without masses.  Cardiac exam regular rate and rhythm normal S1 and S2 without murmurs.  Abdomen obese soft nondistended without hepatosplenomegaly masses or tenderness.  Extremities without pitting edema.  Neuro no gross focal deficits on brief exam.      Assessment & Plan:  Status post TAH/BSO for complex atypical endometrial hyperplasia with no cancer being found in October 2020  Rheumatoid arthritis-treated by [facility]  Morbid obesity-history of gastric bypass surgery  Anxiety and depression treated with Wellbutrin and Celexa  Chronic musculoskeletal pain  Hypothyroidism-stable on thyroid replacement medication-TSH normal  History of vitamin D deficiency will be treated with weekly vitamin D.  Level is 19 and was 20 in February 2020.  Allergic rhinitis treated with Xyzal and Singulair  Hyperlipidemia treated with Crestor 3 times a week-lipid  Impaired glucose tolerance-hemoglobin A1c 6% -controlled with diet only  Abnormal dipstick UA with small LE but urine culture had no growth  Plan: Continue to encourage diet exercise and weight loss.  Have previously recommended Dr. Migdalia Dk clinic.  Return in 6 months or as needed.  No change in medication regimen.

## 2019-05-09 LAB — URINE CULTURE
MICRO NUMBER:: 10251900
Result:: NO GROWTH
SPECIMEN QUALITY:: ADEQUATE

## 2019-05-09 LAB — COMPLETE METABOLIC PANEL WITH GFR
AG Ratio: 1.3 (calc) (ref 1.0–2.5)
ALT: 17 U/L (ref 6–29)
AST: 16 U/L (ref 10–35)
Albumin: 4 g/dL (ref 3.6–5.1)
Alkaline phosphatase (APISO): 107 U/L (ref 37–153)
BUN: 15 mg/dL (ref 7–25)
CO2: 27 mmol/L (ref 20–32)
Calcium: 9.1 mg/dL (ref 8.6–10.4)
Chloride: 103 mmol/L (ref 98–110)
Creat: 0.78 mg/dL (ref 0.50–1.05)
GFR, Est African American: 98 mL/min/{1.73_m2} (ref >=60)
GFR, Est Non African American: 85 mL/min/{1.73_m2} (ref >=60)
Globulin: 3 g/dL (calc) (ref 1.9–3.7)
Glucose, Bld: 122 mg/dL — ABNORMAL HIGH (ref 65–99)
Potassium: 4.6 mmol/L (ref 3.5–5.3)
Sodium: 140 mmol/L (ref 135–146)
Total Bilirubin: 0.9 mg/dL (ref 0.2–1.2)
Total Protein: 7 g/dL (ref 6.1–8.1)

## 2019-05-09 LAB — CBC WITH DIFFERENTIAL/PLATELET
Absolute Monocytes: 331 cells/uL (ref 200–950)
Basophils Absolute: 17 cells/uL (ref 0–200)
Basophils Relative: 0.3 %
Eosinophils Absolute: 177 cells/uL (ref 15–500)
Eosinophils Relative: 3.1 %
HCT: 46.3 % — ABNORMAL HIGH (ref 35.0–45.0)
Hemoglobin: 15.2 g/dL (ref 11.7–15.5)
Lymphs Abs: 1305 cells/uL (ref 850–3900)
MCH: 30.2 pg (ref 27.0–33.0)
MCHC: 32.8 g/dL (ref 32.0–36.0)
MCV: 91.9 fL (ref 80.0–100.0)
MPV: 10.3 fL (ref 7.5–12.5)
Monocytes Relative: 5.8 %
Neutro Abs: 3870 cells/uL (ref 1500–7800)
Neutrophils Relative %: 67.9 %
Platelets: 251 10*3/uL (ref 140–400)
RBC: 5.04 10*6/uL (ref 3.80–5.10)
RDW: 12.2 % (ref 11.0–15.0)
Total Lymphocyte: 22.9 %
WBC: 5.7 10*3/uL (ref 3.8–10.8)

## 2019-05-09 LAB — LIPID PANEL
Cholesterol: 199 mg/dL (ref <200)
HDL: 95 mg/dL (ref >=50)
LDL Cholesterol (Calc): 82 mg/dL (calc)
Non-HDL Cholesterol (Calc): 104 mg/dL (calc) (ref <130)
Total CHOL/HDL Ratio: 2.1 (calc) (ref <5.0)
Triglycerides: 121 mg/dL (ref <150)

## 2019-05-09 LAB — HEMOGLOBIN A1C
Hgb A1c MFr Bld: 6 % of total Hgb — ABNORMAL HIGH (ref <5.7)
Mean Plasma Glucose: 126 (calc)
eAG (mmol/L): 7 (calc)

## 2019-05-09 LAB — HEPATITIS C ANTIBODY
Hepatitis C Ab: NONREACTIVE
SIGNAL TO CUT-OFF: 0.01 (ref <1.00)

## 2019-05-09 LAB — TSH: TSH: 1.11 mIU/L (ref 0.40–4.50)

## 2019-05-09 LAB — VITAMIN D 25 HYDROXY (VIT D DEFICIENCY, FRACTURES): Vit D, 25-Hydroxy: 19 ng/mL — ABNORMAL LOW (ref 30–100)

## 2019-05-09 MED ORDER — ERGOCALCIFEROL 1.25 MG (50000 UT) PO CAPS: 50000.0000 [IU] | ORAL_CAPSULE | ORAL | 3 refills | Status: DC

## 2019-05-10 ENCOUNTER — Other Ambulatory Visit: Payer: Self-pay | Admitting: Internal Medicine

## 2019-05-11 NOTE — Telephone Encounter (Signed)
Please refill these  

## 2019-05-12 ENCOUNTER — Other Ambulatory Visit: Payer: Self-pay | Admitting: Internal Medicine

## 2019-06-17 NOTE — Patient Instructions (Signed)
It was a pleasure to see you today.  Continue current medications and follow-up in 6 months.  Please work on diet and and weight loss.

## 2019-07-11 ENCOUNTER — Other Ambulatory Visit: Payer: Self-pay

## 2019-07-11 ENCOUNTER — Ambulatory Visit: Payer: 59 | Admitting: Orthopaedic Surgery

## 2019-07-11 ENCOUNTER — Encounter: Payer: Self-pay | Admitting: Orthopaedic Surgery

## 2019-07-11 DIAGNOSIS — M1711 Unilateral primary osteoarthritis, right knee: Secondary | ICD-10-CM | POA: Diagnosis not present

## 2019-07-11 DIAGNOSIS — M25562 Pain in left knee: Secondary | ICD-10-CM | POA: Diagnosis not present

## 2019-07-11 DIAGNOSIS — M1712 Unilateral primary osteoarthritis, left knee: Secondary | ICD-10-CM

## 2019-07-11 DIAGNOSIS — M25561 Pain in right knee: Secondary | ICD-10-CM

## 2019-07-11 DIAGNOSIS — G8929 Other chronic pain: Secondary | ICD-10-CM

## 2019-07-11 MED ORDER — METHYLPREDNISOLONE ACETATE 40 MG/ML IJ SUSP
40.0000 mg | INTRAMUSCULAR | Status: AC | PRN
  Administered 2019-07-11: 40 mg via INTRA_ARTICULAR

## 2019-07-11 MED ORDER — LIDOCAINE HCL 1 % IJ SOLN
3.0000 mL | INTRAMUSCULAR | Status: AC | PRN
  Administered 2019-07-11: 3 mL

## 2019-07-11 NOTE — Progress Notes (Signed)
Office Visit Note   Patient: Mary Herman           Date of Birth: 04-15-1960           MRN: [id number] Visit Date: 07/11/2019              Requested by: Elby Showers, MD [address] [place],  [place] [address] PCP: Elby Showers, MD   Assessment & Plan: Visit Diagnoses:  1. Unilateral primary osteoarthritis, left knee   2. Unilateral primary osteoarthritis, right knee   3. Chronic pain of left knee   4. Chronic pain of right knee     Plan: Per the patient's request I did provide a steroid injection in both knees today which she tolerated well.  I counseled her about weight loss and quad strengthening exercises.  Follow-up will be as needed.  She knows to wait at least 3 months before injections.  When she does come in again for repeat injections I would like to have her weight and BMI calculated.  Follow-Up Instructions: Return if symptoms worsen or fail to improve.   Orders:  Orders Placed This Encounter  Procedures  . Large Joint Inj  . Large Joint Inj   No orders of the defined types were placed in this encounter.     Procedures: Large Joint Inj: L knee on 07/11/2019 8:56 AM Indications: diagnostic evaluation and pain Details: 22 G 1.5 in needle, superolateral approach  Arthrogram: No  Medications: 3 mL lidocaine 1 %; 40 mg methylPREDNISolone acetate 40 MG/ML Outcome: tolerated well, no immediate complications Procedure, treatment alternatives, risks and benefits explained, specific risks discussed. Consent was given by the patient. Immediately prior to procedure a time out was called to verify the correct patient, procedure, equipment, support staff and site/side marked as required. Patient was prepped and draped in the usual sterile fashion.   Large Joint Inj: R knee on 07/11/2019 8:56 AM Indications: diagnostic evaluation and pain Details: 22 G 1.5 in needle, superolateral approach  Arthrogram: No  Medications: 3 mL lidocaine 1 %; 40 mg  methylPREDNISolone acetate 40 MG/ML Outcome: tolerated well, no immediate complications Procedure, treatment alternatives, risks and benefits explained, specific risks discussed. Consent was given by the patient. Immediately prior to procedure a time out was called to verify the correct patient, procedure, equipment, support staff and site/side marked as required. Patient was prepped and draped in the usual sterile fashion.       Clinical Data: No additional findings.   Subjective: Chief Complaint  Patient presents with  . Left Knee - Follow-up  . Right Knee - Follow-up  The patient is well-known to me.  She has known arthritis in both her knees and comes in occasion for steroid injections.  Her knees have flared up again in terms of acute pain and she like to have steroid injections in both knees today.  Is been 3 months since her last injections.  She is someone that is morbidly obese and has been there with a pressure on her knees quite a bit.  She has had no other acute change in her medical status.  HPI  Review of Systems She currently denies any headache, chest pain, shortness of breath, fever, chills, nausea, vomiting  Objective: Vital Signs: LMP 05/25/2014   Physical Exam She is alert and orient x3 and in no acute distress Ortho Exam Examination of both knees shows varus malalignment with pain throughout the arc of motion of both knees.  Both knees  are ligamentously stable. Specialty Comments:  No specialty comments available.  Imaging: No results found.   PMFS History: Patient Active Problem List   Diagnosis Date Noted  . Complex atypical endometrial hyperplasia 01/31/2019  . Morbid obesity ([place]) 10/11/2018  . Rheumatoid arthritis ([place]) 12/19/2016  . OA (osteoarthritis) of knee 04/07/2016  . Hypothyroidism 01/22/2015  . Chronic low back pain 01/22/2015  . Depression 01/22/2015  . Allergic rhinitis 01/22/2015  . Ureteral calculus 12/26/2012  . Personal  history of gastric bypass 11/12/2012  . Hyperlipidemia    Past Medical History:  Diagnosis Date  . Anemia   . Chronic back pain   . Depression   . Dyspnea    with activity  . Environmental allergies   . Family history of adverse reaction to anesthesia    daughter- n/v      . Fibroid   . GERD (gastroesophageal reflux disease)   . Hematuria   . History of cystitis    INTERSTITIAL CYSTITIS--  NO ISSUES FOR YRS  . History of gastroesophageal reflux (GERD)    DENIES ISSUES SINCE GASTRIC BY PASS  . History of hypertension    NO ISSUE SINCE WT LOSS AFTER GASTRIC BYPASS  . History of kidney stones   . History of obstructive sleep apnea    USED CPAP--  NO ISSUE SINCE WT LOSS AFTER GASTRIC BYPASS  . Hypertension   . Pneumonia   . Pre-diabetes    pt. denies  . Rheumatoid arthritis ([place])   . Right ureteral stone   . Sleep apnea    no cpap   . Thyroid disease   . Trochanteric bursitis of both hips     Family History  Problem Relation Age of Onset  . Hypertension Mother   . Stroke Mother   . Hypertension Brother   . Heart disease Maternal Uncle   . Diabetes Maternal Grandmother   . Cancer Maternal Grandmother        THROAT CA  . Cancer Maternal Grandfather        THROAT CA    Past Surgical History:  Procedure Laterality Date  . CARDIAC CATHETERIZATION  12-07-2006  DR [facility]   NORMAL CORONARIES ARTERIES/ NORMAL LVF/ MILD INCREASED END-DIASTOLIC PRESSURE  . CESAREAN SECTION  1998  . CHOLECYSTECTOMY  1980'S  . CYSTOSCOPY WITH RETROGRADE PYELOGRAM, URETEROSCOPY AND STENT PLACEMENT Bilateral 12/26/2012   Procedure: cystoscopy with bilateral retrogrades, bilateral ureteroscopy ;  Surgeon: Bernestine Amass, MD;  Location: [facility];  Service: Urology;  Laterality: Bilateral;  . CYSTOSCOPY WITH STENT PLACEMENT Bilateral 12/26/2012   Procedure: CYSTOSCOPY WITH STENT PLACEMENT;  Surgeon: Bernestine Amass, MD;  Location[facility];  Service: Urology;   Laterality: Bilateral;  . CYSTOSCOPY/ HYDRODISTENTION  YRS AGO  . DILATATION & CURETTAGE/HYSTEROSCOPY WITH MYOSURE N/A 11/25/2018   Procedure: [place];  Surgeon: Anastasio Auerbach, MD;  Location: [place];  Service: Gynecology;  Laterality: N/A;  . HERNIA REPAIR    . HOLMIUM LASER APPLICATION Right 0000000   Procedure: HOLMIUM LASER APPLICATION;  Surgeon: Bernestine Amass, MD;  Location: [facility];  Service: Urology;  Laterality: Right;  . KNEE ARTHROSCOPY Left 1992  . ROBOTIC ASSISTED TOTAL HYSTERECTOMY WITH BILATERAL SALPINGO OOPHERECTOMY N/A 01/31/2019   Procedure: XI ROBOTIC ASSISTED TOTAL HYSTERECTOMY WITH BILATERAL SALPINGO OOPHORECTOMY;  Surgeon: Everitt Amber, MD;  Location: [facility];  Service: Gynecology;  Laterality: N/A;  . ROUX-EN-Y PROCEDURE  07-25-2007  . SHOULDER ARTHROSCOPY  Right 2011  . TMJ ARTHROPLASTY  1990'S   BILATERAL UPPER   Social History   Occupational History  . Not on file  Tobacco Use  . Smoking status: Never Smoker  . Smokeless tobacco: Never Used  Substance and Sexual Activity  . Alcohol use: No  . Drug use: No  . Sexual activity: Not Currently    Partners: Male    Birth control/protection: Surgical    Comment: husband vasectomy-1st intercourse 26 yo-Fewer than 5 partners

## 2019-07-26 ENCOUNTER — Other Ambulatory Visit: Payer: Self-pay | Admitting: Internal Medicine

## 2019-07-28 ENCOUNTER — Other Ambulatory Visit: Payer: Self-pay | Admitting: Internal Medicine

## 2019-07-30 ENCOUNTER — Other Ambulatory Visit: Payer: Self-pay | Admitting: Internal Medicine

## 2019-07-31 ENCOUNTER — Telehealth: Payer: Self-pay | Admitting: Orthopaedic Surgery

## 2019-07-31 MED ORDER — HYDROCODONE-ACETAMINOPHEN 5-325 MG PO TABS: 1.0000 | ORAL_TABLET | Freq: Two times a day (BID) | ORAL | 0 refills | Status: DC | PRN

## 2019-07-31 NOTE — Telephone Encounter (Signed)
Patient called needing Rx refilled Hydrocodone. [facility] uses [facility] in [place].  The number to contact patient is [phone]

## 2019-07-31 NOTE — Telephone Encounter (Signed)
Please advise 

## 2019-08-20 ENCOUNTER — Other Ambulatory Visit: Payer: Self-pay | Admitting: Internal Medicine

## 2019-10-10 ENCOUNTER — Telehealth: Payer: Self-pay

## 2019-10-10 ENCOUNTER — Ambulatory Visit: Payer: 59 | Admitting: Orthopaedic Surgery

## 2019-10-10 ENCOUNTER — Encounter: Payer: Self-pay | Admitting: Orthopaedic Surgery

## 2019-10-10 DIAGNOSIS — G8929 Other chronic pain: Secondary | ICD-10-CM | POA: Diagnosis not present

## 2019-10-10 DIAGNOSIS — M25562 Pain in left knee: Secondary | ICD-10-CM

## 2019-10-10 DIAGNOSIS — M25561 Pain in right knee: Secondary | ICD-10-CM | POA: Diagnosis not present

## 2019-10-10 MED ORDER — LIDOCAINE HCL 1 % IJ SOLN
3.0000 mL | INTRAMUSCULAR | Status: AC | PRN
  Administered 2019-10-10: 3 mL

## 2019-10-10 MED ORDER — TRAMADOL HCL 50 MG PO TABS: 50.0000 mg | ORAL_TABLET | Freq: Three times a day (TID) | ORAL | 0 refills | Status: DC | PRN

## 2019-10-10 MED ORDER — METHYLPREDNISOLONE ACETATE 40 MG/ML IJ SUSP
40.0000 mg | INTRAMUSCULAR | Status: AC | PRN
  Administered 2019-10-10: 40 mg via INTRA_ARTICULAR

## 2019-10-10 NOTE — Progress Notes (Signed)
Office Visit Note   Patient: Mary Herman           Date of Birth: Aug 20, 1960           MRN: [id number] Visit Date: 10/10/2019              Requested by: Elby Showers, MD [address] [place],  [place] [id number] PCP: Elby Showers, MD   Assessment & Plan: Visit Diagnoses:  1. Chronic pain of left knee   2. Chronic pain of right knee     Plan: Per the patient's request I did provide a steroid injection in both knees which she tolerated well.  We can see her back in 3 months for repeat steroid injections.  I still encourage weight loss.  At her next visit I would like her weight and her BMI calculated.  Follow-Up Instructions: Return in about 3 months (around 01/10/2020).   Orders:  Orders Placed This Encounter  Procedures   Large Joint Inj   Large Joint Inj   No orders of the defined types were placed in this encounter.     Procedures: Large Joint Inj: R knee on 10/10/2019 9:17 AM Indications: diagnostic evaluation and pain Details: 22 G 1.5 in needle, superolateral approach  Arthrogram: No  Medications: 3 mL lidocaine 1 %; 40 mg methylPREDNISolone acetate 40 MG/ML Outcome: tolerated well, no immediate complications Procedure, treatment alternatives, risks and benefits explained, specific risks discussed. Consent was given by the patient. Immediately prior to procedure a time out was called to verify the correct patient, procedure, equipment, support staff and site/side marked as required. Patient was prepped and draped in the usual sterile fashion.   Large Joint Inj: L knee on 10/10/2019 9:17 AM Indications: diagnostic evaluation and pain Details: 22 G 1.5 in needle, superolateral approach  Arthrogram: No  Medications: 3 mL lidocaine 1 %; 40 mg methylPREDNISolone acetate 40 MG/ML Outcome: tolerated well, no immediate complications Procedure, treatment alternatives, risks and benefits explained, specific risks discussed. Consent was given by the  patient. Immediately prior to procedure a time out was called to verify the correct patient, procedure, equipment, support staff and site/side marked as required. Patient was prepped and draped in the usual sterile fashion.       Clinical Data: No additional findings.   Subjective: Chief Complaint  Patient presents with   Right Knee - Injections   Left Knee - Injections  The patient comes in today requesting steroid injections in both her knees.  She has end-stage arthritis of both knees.  She is not a diabetic but is morbidly obese.  She has been more sedentary due to the pain in her knees.  She denies any other acute change in medical status but states the left knee has gotten significantly worse and hurts posteriorly after sitting for long period time and gets up.  It pops as well.  HPI  Review of Systems Today she denies any headache, chest pain, shortness of breath, fever, chills, nausea, vomiting  Objective: Vital Signs: LMP 05/25/2014   Physical Exam She is alert and orient x3 and in no acute distress Ortho Exam Examination of both knees shows varus malalignment and significant pain throughout the arc of motion of both knees. Specialty Comments:  No specialty comments available.  Imaging: No results found.   PMFS History: Patient Active Problem List   Diagnosis Date Noted   Complex atypical endometrial hyperplasia 01/31/2019   Morbid obesity ([place]) 10/11/2018   Rheumatoid arthritis ([place])  12/19/2016   OA (osteoarthritis) of knee 04/07/2016   Hypothyroidism 01/22/2015   Chronic low back pain 01/22/2015   Depression 01/22/2015   Allergic rhinitis 01/22/2015   Ureteral calculus 12/26/2012   Personal history of gastric bypass 11/12/2012   Hyperlipidemia    Past Medical History:  Diagnosis Date   Anemia    Chronic back pain    Depression    Dyspnea    with activity   Environmental allergies    Family history of adverse reaction to  anesthesia    daughter- n/v       Fibroid    GERD (gastroesophageal reflux disease)    Hematuria    History of cystitis    INTERSTITIAL CYSTITIS--  NO ISSUES FOR YRS   History of gastroesophageal reflux (GERD)    DENIES ISSUES SINCE GASTRIC BY PASS   History of hypertension    NO ISSUE SINCE WT LOSS AFTER GASTRIC BYPASS   History of kidney stones    History of obstructive sleep apnea    USED CPAP--  NO ISSUE SINCE WT LOSS AFTER GASTRIC BYPASS   Hypertension    Pneumonia    Pre-diabetes    pt. denies   Rheumatoid arthritis ([place])    Right ureteral stone    Sleep apnea    no cpap    Thyroid disease    Trochanteric bursitis of both hips     Family History  Problem Relation Age of Onset   Hypertension Mother    Stroke Mother    Hypertension Brother    Heart disease Maternal Uncle    Diabetes Maternal Grandmother    Cancer Maternal Grandmother        THROAT CA   Cancer Maternal Grandfather        THROAT CA    Past Surgical History:  Procedure Laterality Date   CARDIAC CATHETERIZATION  12-07-2006  DR [facility]   NORMAL CORONARIES ARTERIES/ NORMAL LVF/ MILD INCREASED END-DIASTOLIC PRESSURE   CESAREAN SECTION  1998   CHOLECYSTECTOMY  1980'S   CYSTOSCOPY WITH RETROGRADE PYELOGRAM, URETEROSCOPY AND STENT PLACEMENT Bilateral 12/26/2012   Procedure: cystoscopy with bilateral retrogrades, bilateral ureteroscopy ;  Surgeon: Bernestine Amass, MD;  Location: [facility];  Service: Urology;  Laterality: Bilateral;   CYSTOSCOPY WITH STENT PLACEMENT Bilateral 12/26/2012   Procedure: CYSTOSCOPY WITH STENT PLACEMENT;  Surgeon: Bernestine Amass, MD;  Location: [facility];  Service: Urology;  Laterality: Bilateral;   CYSTOSCOPY/ HYDRODISTENTION  YRS AGO   DILATATION & CURETTAGE/HYSTEROSCOPY WITH MYOSURE N/A 11/25/2018   Procedure: [place];  Surgeon: Anastasio Auerbach, MD;  Location: [place];   Service: Gynecology;  Laterality: N/A;   HERNIA REPAIR     HOLMIUM LASER APPLICATION Right 62/02/3084   Procedure: HOLMIUM LASER APPLICATION;  Surgeon: Bernestine Amass, MD;  Location: [facility];  Service: Urology;  Laterality: Right;   KNEE ARTHROSCOPY Left 1992   ROBOTIC ASSISTED TOTAL HYSTERECTOMY WITH BILATERAL SALPINGO OOPHERECTOMY N/A 01/31/2019   Procedure: XI ROBOTIC ASSISTED TOTAL HYSTERECTOMY WITH BILATERAL SALPINGO OOPHORECTOMY;  Surgeon: Everitt Amber, MD;  Location: [facility];  Service: Gynecology;  Laterality: N/A;   ROUX-EN-Y PROCEDURE  07-25-2007   SHOULDER ARTHROSCOPY Right 2011   TMJ ARTHROPLASTY  1990'S   BILATERAL UPPER   Social History   Occupational History   Not on file  Tobacco Use   Smoking status: Never Smoker   Smokeless tobacco: Never Used  Vaping Use   Vaping  Use: Never used  Substance and Sexual Activity   Alcohol use: No   Drug use: No   Sexual activity: Not Currently    Partners: Male    Birth control/protection: Surgical    Comment: husband vasectomy-1st intercourse 26 yo-Fewer than 5 partners

## 2019-10-10 NOTE — Telephone Encounter (Signed)
Tramadol

## 2019-10-10 NOTE — Telephone Encounter (Signed)
Which medication does she need refilled?

## 2019-10-10 NOTE — Telephone Encounter (Signed)
Patient called in saying she forgot to ask to refill her medication .

## 2019-10-10 NOTE — Telephone Encounter (Signed)
Please advise 

## 2019-11-10 ENCOUNTER — Ambulatory Visit: Payer: No Typology Code available for payment source | Admitting: Internal Medicine

## 2019-11-10 ENCOUNTER — Encounter: Payer: Self-pay | Admitting: Internal Medicine

## 2019-11-10 ENCOUNTER — Other Ambulatory Visit: Payer: Self-pay

## 2019-11-10 DIAGNOSIS — Z23 Encounter for immunization: Secondary | ICD-10-CM

## 2019-11-10 DIAGNOSIS — M059 Rheumatoid arthritis with rheumatoid factor, unspecified: Secondary | ICD-10-CM

## 2019-11-10 DIAGNOSIS — F439 Reaction to severe stress, unspecified: Secondary | ICD-10-CM

## 2019-11-10 DIAGNOSIS — E039 Hypothyroidism, unspecified: Secondary | ICD-10-CM

## 2019-11-10 DIAGNOSIS — R7302 Impaired glucose tolerance (oral): Secondary | ICD-10-CM | POA: Diagnosis not present

## 2019-11-10 DIAGNOSIS — M160 Bilateral primary osteoarthritis of hip: Secondary | ICD-10-CM

## 2019-11-10 DIAGNOSIS — E1169 Type 2 diabetes mellitus with other specified complication: Secondary | ICD-10-CM

## 2019-11-10 DIAGNOSIS — E785 Hyperlipidemia, unspecified: Secondary | ICD-10-CM

## 2019-11-10 DIAGNOSIS — Z9884 Bariatric surgery status: Secondary | ICD-10-CM

## 2019-11-10 MED ORDER — BUPROPION HCL ER (XL) 300 MG PO TB24: 300.0000 mg | ORAL_TABLET | Freq: Every day | ORAL | 0 refills | Status: DC

## 2019-11-10 MED ORDER — HYDROCODONE-ACETAMINOPHEN 5-325 MG PO TABS: ORAL_TABLET | ORAL | 0 refills | Status: DC

## 2019-11-10 NOTE — Progress Notes (Signed)
   Subjective:    Patient ID: Mary Herman, female    DOB: 02-12-1961, 59 y.o.   MRN: [id number]  HPI 59 year old Female for 6 month recheck.Still has issues with obesity. Working from home for [employer] not be going back into the office. Has been told she can stay at home for work which she likes. Husband is to have back surgery next week. Patient has osteoarthritis of both knees but says insurnace will not approve TKA she says  due to elevated BMI. I have asked her to consider revision of gastric bypass. She will consider this but not until husband recovers from back surgery.  Hx Rheumatoid arthritis treated with Enbrel by [facility].This is stable for now she says.  Will prescribe #15 tabs of Norco 5/325 for severe pain which she uses sparingly.  She has a history of hypothyroidism, depression, allergic rhinitis, chronic back pain, rheumatoid arthritis, morbid obesity.  History of TMJ syndrome.  Status post cholecystectomy.  C-section 1998.  Left knee arthroscopic surgery over 25 years ago.  Hysterectomy with BSO for complex atypical endometrial hyperplasia with no cancer being found.  She is allergic to cats and has cats in her home.  A gastric bypass surgery in 2009 by Dr. Hassell Done.  Have offered Dr. Migdalia Dk clinic as an option but has not really been motivated to exercise or diet.    Review of Systems see above     Objective:   Physical Exam  Blood pressure 110/70 pulse 93 temperature 98 degrees orally pulse oximetry 97% weight 318 pounds height 5 feet 3 inches BMI 56.33 No thyromegaly.  No carotid bruits.  Chest clear to auscultation.  Cardiac exam regular rate and rhythm normal S1 and S2.  No lower extremity pitting edema.     Assessment & Plan:  Morbid obesity-unable to exercise due to knee pain.  History of gastric bypass surgery done in 2009 by Dr. Hassell Done.  Would benefit from consultation with Dr. Hassell Done to see if this could be revised to help  with her weight loss but currently declines  Anxiety and depression treated with Wellbutrin and Celexa.  Previously was on Wellbutrin 150 mg twice daily.  Change Wellbutrin to 300 mg XL daily.  Take citalopram 20 mg daily.  May want to consider psychiatric referral if symptoms not improving.  Could benefit from counseling.  Osteoarthritis of the knees followed by Dr. Ninfa Linden  History of rheumatoid arthritis treated by rheumatologist with Enbrel  Hypothyroidism-TSH checked today on thyroid replacement medication  History of vitamin D deficiency treated with weekly vitamin D supplement.  Impaired glucose tolerance-hemoglobin A1c checked  Hyperlipidemia treated with Crestor 5 mg 3 times a week  Allergic rhinitis treated with Xyzal and Singulair  Plan: Have given her small quantity #15 tablets of hydrocodone APAP 5/325 to take for severe musculoskeletal pain only as needed basis sparingly.  Flu vaccine given.  Return in 6 months.

## 2019-11-10 NOTE — Patient Instructions (Addendum)
Flu vaccine given.  Labs drawn and pending.  Consider surgical consultation regarding previous gastric bypass surgery and possible revision.  Take hydrocodone APAP sparingly for severe knee pain.  Prescribed # 15 tablets only.  Consider counseling for anxiety and depression.  Continue Crestor 5 mg 3 times a week.  Watch diet.  Return in 6 months for health maintenance exam.  Continue vitamin D supplement.  Continue thyroid replacement.

## 2019-11-11 ENCOUNTER — Other Ambulatory Visit: Payer: Self-pay | Admitting: Internal Medicine

## 2019-11-11 LAB — HEPATIC FUNCTION PANEL
AG Ratio: 1.4 (calc) (ref 1.0–2.5)
ALT: 16 U/L (ref 6–29)
AST: 17 U/L (ref 10–35)
Albumin: 3.7 g/dL (ref 3.6–5.1)
Alkaline phosphatase (APISO): 114 U/L (ref 37–153)
Bilirubin, Direct: 0.2 mg/dL (ref 0.0–0.2)
Globulin: 2.6 g/dL (calc) (ref 1.9–3.7)
Indirect Bilirubin: 0.9 mg/dL (calc) (ref 0.2–1.2)
Total Bilirubin: 1.1 mg/dL (ref 0.2–1.2)
Total Protein: 6.3 g/dL (ref 6.1–8.1)

## 2019-11-11 LAB — LIPID PANEL
Cholesterol: 175 mg/dL (ref <200)
HDL: 87 mg/dL (ref >=50)
LDL Cholesterol (Calc): 69 mg/dL (calc)
Non-HDL Cholesterol (Calc): 88 mg/dL (calc) (ref <130)
Total CHOL/HDL Ratio: 2 (calc) (ref <5.0)
Triglycerides: 103 mg/dL (ref <150)

## 2019-11-11 LAB — HEMOGLOBIN A1C
Hgb A1c MFr Bld: 6.1 % of total Hgb — ABNORMAL HIGH (ref <5.7)
Mean Plasma Glucose: 128 (calc)
eAG (mmol/L): 7.1 (calc)

## 2019-11-11 LAB — TSH: TSH: 3.1 mIU/L (ref 0.40–4.50)

## 2019-11-11 NOTE — Telephone Encounter (Signed)
These were filled Friday I think. Wellbutrin is now 300 mg XL so this wellbutrin needs to be D/Ced

## 2019-11-12 ENCOUNTER — Other Ambulatory Visit: Payer: Self-pay | Admitting: Internal Medicine

## 2019-11-13 NOTE — Telephone Encounter (Signed)
Only WELLBUTRIN was sent, wellbutrin will be discontinued.

## 2020-01-07 ENCOUNTER — Other Ambulatory Visit: Payer: Self-pay | Admitting: Internal Medicine

## 2020-01-08 ENCOUNTER — Telehealth: Payer: Self-pay | Admitting: Orthopaedic Surgery

## 2020-01-08 NOTE — Telephone Encounter (Signed)
Please advise 

## 2020-01-08 NOTE — Telephone Encounter (Signed)
I am still fine with placing steroid injections in her knees.

## 2020-01-08 NOTE — Telephone Encounter (Signed)
Pt called and advised and stated understanding  

## 2020-01-08 NOTE — Telephone Encounter (Signed)
Pt called stating she has rheumatoid arthritis and has recently stopped taking prednisone but she has an appt for a cortisone injection coming up and wants to make sure it's ok to get the injection since she just stop taking the prednisone? Would like a CB with answer  [phone]

## 2020-01-10 ENCOUNTER — Encounter: Payer: Self-pay | Admitting: Orthopaedic Surgery

## 2020-01-10 ENCOUNTER — Ambulatory Visit: Payer: No Typology Code available for payment source | Admitting: Orthopaedic Surgery

## 2020-01-10 DIAGNOSIS — G8929 Other chronic pain: Secondary | ICD-10-CM

## 2020-01-10 DIAGNOSIS — M25562 Pain in left knee: Secondary | ICD-10-CM | POA: Diagnosis not present

## 2020-01-10 DIAGNOSIS — M1712 Unilateral primary osteoarthritis, left knee: Secondary | ICD-10-CM

## 2020-01-10 DIAGNOSIS — M1711 Unilateral primary osteoarthritis, right knee: Secondary | ICD-10-CM | POA: Diagnosis not present

## 2020-01-10 DIAGNOSIS — M25561 Pain in right knee: Secondary | ICD-10-CM

## 2020-01-10 MED ORDER — LIDOCAINE HCL 1 % IJ SOLN
3.0000 mL | INTRAMUSCULAR | Status: AC | PRN
  Administered 2020-01-10: 3 mL

## 2020-01-10 MED ORDER — METHYLPREDNISOLONE ACETATE 40 MG/ML IJ SUSP
40.0000 mg | INTRAMUSCULAR | Status: AC | PRN
  Administered 2020-01-10: 40 mg via INTRA_ARTICULAR

## 2020-01-10 NOTE — Progress Notes (Signed)
Office Visit Note   Patient: Mary Herman           Date of Birth: 03/27/1960           MRN: [id number] Visit Date: 01/10/2020              Requested by: Elby Showers, MD [address] [place],  [place] [address] PCP: Elby Showers, MD   Assessment & Plan: Visit Diagnoses:  1. Chronic pain of left knee   2. Chronic pain of right knee   3. Unilateral primary osteoarthritis, left knee   4. Unilateral primary osteoarthritis, right knee     Plan: I did counsel her once again about weight loss.  I provide a steroid injection in both knees.  All question concerns were answered and addressed.  We can do this again in 3 months if she would like.  Follow-Up Instructions: Return in about 3 months (around 04/11/2020).   Orders:  Orders Placed This Encounter  Procedures  . Large Joint Inj  . Large Joint Inj   No orders of the defined types were placed in this encounter.     Procedures: Large Joint Inj: R knee on 01/10/2020 9:17 AM Indications: diagnostic evaluation and pain Details: 22 G 1.5 in needle, superolateral approach  Arthrogram: No  Medications: 3 mL lidocaine 1 %; 40 mg methylPREDNISolone acetate 40 MG/ML Outcome: tolerated well, no immediate complications Procedure, treatment alternatives, risks and benefits explained, specific risks discussed. Consent was given by the patient. Immediately prior to procedure a time out was called to verify the correct patient, procedure, equipment, support staff and site/side marked as required. Patient was prepped and draped in the usual sterile fashion.   Large Joint Inj: L knee on 01/10/2020 9:18 AM Indications: diagnostic evaluation and pain Details: 22 G 1.5 in needle, superolateral approach  Arthrogram: No  Medications: 3 mL lidocaine 1 %; 40 mg methylPREDNISolone acetate 40 MG/ML Outcome: tolerated well, no immediate complications Procedure, treatment alternatives, risks and benefits explained, specific risks  discussed. Consent was given by the patient. Immediately prior to procedure a time out was called to verify the correct patient, procedure, equipment, support staff and site/side marked as required. Patient was prepped and draped in the usual sterile fashion.       Clinical Data: No additional findings.   Subjective: Chief Complaint  Patient presents with  . Right Knee - Follow-up  . Left Knee - Follow-up  The patient is very well-known to me.  She has debilitating bilateral knee pain with known osteoarthritis.  She comes in for steroid injections about every 3 months.  That is the only thing that is helping her knees.  We counseled her again about weight loss is much as we can to take pressure off her knees.  She has had no other significant acute change in medical status.  She did fall recently landing on her right knee and has significant bruising of that knee.  She does report some numbness in the knee where she contused her knee.  HPI  Review of Systems Today she denies any headache, chest pain, shortness of breath, fever, chills, nausea, vomiting  Objective: Vital Signs: LMP 05/25/2014   Physical Exam She is alert and oriented x3 and in no acute distress Ortho Exam Examination of both knees shows bruising on the right knee with some numbness around the inferior branch of the saphenous nerve distribution.  Her extensor mechanism is intact on both knees.  She has  varus malalignment with global tenderness. Specialty Comments:  No specialty comments available.  Imaging: No results found.   PMFS History: Patient Active Problem List   Diagnosis Date Noted  . Complex atypical endometrial hyperplasia 01/31/2019  . Morbid obesity ([place]) 10/11/2018  . Rheumatoid arthritis ([place]) 12/19/2016  . OA (osteoarthritis) of knee 04/07/2016  . Hypothyroidism 01/22/2015  . Chronic low back pain 01/22/2015  . Depression 01/22/2015  . Allergic rhinitis 01/22/2015  . Ureteral calculus  12/26/2012  . Personal history of gastric bypass 11/12/2012  . Hyperlipidemia    Past Medical History:  Diagnosis Date  . Anemia   . Chronic back pain   . Depression   . Dyspnea    with activity  . Environmental allergies   . Family history of adverse reaction to anesthesia    daughter- n/v      . Fibroid   . GERD (gastroesophageal reflux disease)   . Hematuria   . History of cystitis    INTERSTITIAL CYSTITIS--  NO ISSUES FOR YRS  . History of gastroesophageal reflux (GERD)    DENIES ISSUES SINCE GASTRIC BY PASS  . History of hypertension    NO ISSUE SINCE WT LOSS AFTER GASTRIC BYPASS  . History of kidney stones   . History of obstructive sleep apnea    USED CPAP--  NO ISSUE SINCE WT LOSS AFTER GASTRIC BYPASS  . Hypertension   . Pneumonia   . Pre-diabetes    pt. denies  . Rheumatoid arthritis ([place])   . Right ureteral stone   . Sleep apnea    no cpap   . Thyroid disease   . Trochanteric bursitis of both hips     Family History  Problem Relation Age of Onset  . Hypertension Mother   . Stroke Mother   . Hypertension Brother   . Heart disease Maternal Uncle   . Diabetes Maternal Grandmother   . Cancer Maternal Grandmother        THROAT CA  . Cancer Maternal Grandfather        THROAT CA    Past Surgical History:  Procedure Laterality Date  . CARDIAC CATHETERIZATION  12-07-2006  [facility]   NORMAL CORONARIES ARTERIES/ NORMAL LVF/ MILD INCREASED END-DIASTOLIC PRESSURE  . CESAREAN SECTION  1998  . CHOLECYSTECTOMY  1980'S  . CYSTOSCOPY WITH RETROGRADE PYELOGRAM, URETEROSCOPY AND STENT PLACEMENT Bilateral 12/26/2012   Procedure: cystoscopy with bilateral retrogrades, bilateral ureteroscopy ;  Surgeon: Bernestine Amass, MD;  Location: [facility];  Service: Urology;  Laterality: Bilateral;  . CYSTOSCOPY WITH STENT PLACEMENT Bilateral 12/26/2012   Procedure: CYSTOSCOPY WITH STENT PLACEMENT;  Surgeon: Bernestine Amass, MD;  Location: [facility];   Service: Urology;  Laterality: Bilateral;  . CYSTOSCOPY/ HYDRODISTENTION  YRS AGO  . DILATATION & CURETTAGE/HYSTEROSCOPY WITH MYOSURE N/A 11/25/2018   Procedure: [place];  Surgeon: Anastasio Auerbach, MD;  Location: [place];  Service: Gynecology;  Laterality: N/A;  . HERNIA REPAIR    . HOLMIUM LASER APPLICATION Right 95/07/2128   Procedure: HOLMIUM LASER APPLICATION;  Surgeon: Bernestine Amass, MD;  Location: [facility];  Service: Urology;  Laterality: Right;  . KNEE ARTHROSCOPY Left 1992  . ROBOTIC ASSISTED TOTAL HYSTERECTOMY WITH BILATERAL SALPINGO OOPHERECTOMY N/A 01/31/2019   Procedure: XI ROBOTIC ASSISTED TOTAL HYSTERECTOMY WITH BILATERAL SALPINGO OOPHORECTOMY;  Surgeon: Everitt Amber, MD;  Location: [facility];  Service: Gynecology;  Laterality: N/A;  . ROUX-EN-Y PROCEDURE  07-25-2007  .  SHOULDER ARTHROSCOPY Right 2011  . TMJ ARTHROPLASTY  1990'S   BILATERAL UPPER   Social History   Occupational History  . Not on file  Tobacco Use  . Smoking status: Never Smoker  . Smokeless tobacco: Never Used  Vaping Use  . Vaping Use: Never used  Substance and Sexual Activity  . Alcohol use: No  . Drug use: No  . Sexual activity: Not Currently    Partners: Male    Birth control/protection: Surgical    Comment: husband vasectomy-1st intercourse 26 yo-Fewer than 5 partners

## 2020-01-27 ENCOUNTER — Other Ambulatory Visit: Payer: Self-pay | Admitting: Internal Medicine

## 2020-02-11 ENCOUNTER — Other Ambulatory Visit: Payer: Self-pay | Admitting: Internal Medicine

## 2020-02-24 ENCOUNTER — Other Ambulatory Visit: Payer: Self-pay | Admitting: Internal Medicine

## 2020-03-27 ENCOUNTER — Other Ambulatory Visit: Payer: Self-pay | Admitting: Internal Medicine

## 2020-03-27 DIAGNOSIS — E559 Vitamin D deficiency, unspecified: Secondary | ICD-10-CM

## 2020-04-15 ENCOUNTER — Ambulatory Visit (INDEPENDENT_AMBULATORY_CARE_PROVIDER_SITE_OTHER): Payer: No Typology Code available for payment source | Admitting: Orthopaedic Surgery

## 2020-04-15 ENCOUNTER — Encounter: Payer: Self-pay | Admitting: Orthopaedic Surgery

## 2020-04-15 DIAGNOSIS — M1711 Unilateral primary osteoarthritis, right knee: Secondary | ICD-10-CM

## 2020-04-15 DIAGNOSIS — G8929 Other chronic pain: Secondary | ICD-10-CM

## 2020-04-15 DIAGNOSIS — M1712 Unilateral primary osteoarthritis, left knee: Secondary | ICD-10-CM | POA: Diagnosis not present

## 2020-04-15 DIAGNOSIS — M25562 Pain in left knee: Secondary | ICD-10-CM

## 2020-04-15 DIAGNOSIS — M25561 Pain in right knee: Secondary | ICD-10-CM | POA: Diagnosis not present

## 2020-04-15 MED ORDER — LIDOCAINE HCL 1 % IJ SOLN
3.0000 mL | INTRAMUSCULAR | Status: AC | PRN
  Administered 2020-04-15: 3 mL

## 2020-04-15 MED ORDER — METHYLPREDNISOLONE ACETATE 40 MG/ML IJ SUSP
40.0000 mg | INTRAMUSCULAR | Status: AC | PRN
  Administered 2020-04-15: 40 mg via INTRA_ARTICULAR

## 2020-04-15 NOTE — Progress Notes (Signed)
Office Visit Note   Patient: Mary Herman           Date of Birth: [birth year]           MRN: [id number] Visit Date: 04/15/2020              Requested by: Elby Showers, MD [address] [place],   [id number] PCP: Elby Showers, MD   Assessment & Plan: Visit Diagnoses:  1. Chronic pain of left knee   2. Chronic pain of right knee   3. Unilateral primary osteoarthritis, left knee   4. Unilateral primary osteoarthritis, right knee     Plan: I once again counseled her significantly about weight loss.  We then place steroid injections in both knees which she tolerated well.  We can do this again in 3 months.  At her next visit I would like a weight and BMI calculation.  Follow-Up Instructions: Return in about 3 months (around 07/13/2020).   Orders:  Orders Placed This Encounter  Procedures  . Large Joint Inj  . Large Joint Inj   No orders of the defined types were placed in this encounter.     Procedures: Large Joint Inj: R knee on 04/15/2020 9:20 AM Indications: diagnostic evaluation and pain Details: 22 G 1.5 in needle, superolateral approach  Arthrogram: No  Medications: 3 mL lidocaine 1 %; 40 mg methylPREDNISolone acetate 40 MG/ML Outcome: tolerated well, no immediate complications Procedure, treatment alternatives, risks and benefits explained, specific risks discussed. Consent was given by the patient. Immediately prior to procedure a time out was called to verify the correct patient, procedure, equipment, support staff and site/side marked as required. Patient was prepped and draped in the usual sterile fashion.   Large Joint Inj: L knee on 04/15/2020 9:20 AM Indications: diagnostic evaluation and pain Details: 22 G 1.5 in needle, superolateral approach  Arthrogram: No  Medications: 3 mL lidocaine 1 %; 40 mg methylPREDNISolone acetate 40 MG/ML Outcome: tolerated well, no immediate complications Procedure, treatment alternatives, risks and  benefits explained, specific risks discussed. Consent was given by the patient. Immediately prior to procedure a time out was called to verify the correct patient, procedure, equipment, support staff and site/side marked as required. Patient was prepped and draped in the usual sterile fashion.       Clinical Data: No additional findings.   Subjective: Chief Complaint  Patient presents with  . Left Knee - Follow-up  . Right Knee - Follow-up  The patient is well-known to me.  She has bilateral knee osteoarthritis which is quite significant.  Her BMI has been well over 40 and she comes in every 3 months for steroid injections in her knees.  She has not been able to lose weight.  She is not a diabetic.  Her knee pain is definitely affecting her mobility, her quality of life, and her actives daily living.  HPI  Review of Systems She currently denies any headache, chest pain, shortness of breath, fever, chills, nausea, vomiting  Objective: Vital Signs: LMP 05/25/2014   Physical Exam She is alert and orient x3 and in no acute distress.  She is morbidly obese and walks slowly. Ortho Exam Both knees have significant varus malalignment and global tenderness throughout the arc of motion.  Both knees have significant patellofemoral crepitation. Specialty Comments:  No specialty comments available.  Imaging: No results found.   PMFS History: Patient Active Problem List   Diagnosis Date Noted  . Complex atypical endometrial hyperplasia  01/31/2019  . Morbid obesity ([place]) 10/11/2018  . Rheumatoid arthritis ([place]) 12/19/2016  . OA (osteoarthritis) of knee 04/07/2016  . Hypothyroidism 01/22/2015  . Chronic low back pain 01/22/2015  . Depression 01/22/2015  . Allergic rhinitis 01/22/2015  . Ureteral calculus 12/26/2012  . Personal history of gastric bypass 11/12/2012  . Hyperlipidemia    Past Medical History:  Diagnosis Date  . Anemia   . Chronic back pain   . Depression   .  Dyspnea    with activity  . Environmental allergies   . Family history of adverse reaction to anesthesia    daughter- n/v      . Fibroid   . GERD (gastroesophageal reflux disease)   . Hematuria   . History of cystitis    INTERSTITIAL CYSTITIS--  NO ISSUES FOR YRS  . History of gastroesophageal reflux (GERD)    DENIES ISSUES SINCE GASTRIC BY PASS  . History of hypertension    NO ISSUE SINCE WT LOSS AFTER GASTRIC BYPASS  . History of kidney stones   . History of obstructive sleep apnea    USED CPAP--  NO ISSUE SINCE WT LOSS AFTER GASTRIC BYPASS  . Hypertension   . Pneumonia   . Pre-diabetes    pt. denies  . Rheumatoid arthritis ([place])   . Right ureteral stone   . Sleep apnea    no cpap   . Thyroid disease   . Trochanteric bursitis of both hips     Family History  Problem Relation Age of Onset  . Hypertension Mother   . Stroke Mother   . Hypertension Brother   . Heart disease Maternal Uncle   . Diabetes Maternal Grandmother   . Cancer Maternal Grandmother        THROAT CA  . Cancer Maternal Grandfather        THROAT CA    Past Surgical History:  Procedure Laterality Date  . CARDIAC CATHETERIZATION  12-07-2006  DR [facility]   NORMAL CORONARIES ARTERIES/ NORMAL LVF/ MILD INCREASED END-DIASTOLIC PRESSURE  . CESAREAN SECTION  1998  . CHOLECYSTECTOMY  1980'S  . CYSTOSCOPY WITH RETROGRADE PYELOGRAM, URETEROSCOPY AND STENT PLACEMENT Bilateral 12/26/2012   Procedure: cystoscopy with bilateral retrogrades, bilateral ureteroscopy ;  Surgeon: Bernestine Amass, MD;  Location: [facility];  Service: Urology;  Laterality: Bilateral;  . CYSTOSCOPY WITH STENT PLACEMENT Bilateral 12/26/2012   Procedure: CYSTOSCOPY WITH STENT PLACEMENT;  Surgeon: Bernestine Amass, MD;  Location: [facility];  Service: Urology;  Laterality: Bilateral;  . CYSTOSCOPY/ HYDRODISTENTION  YRS AGO  . DILATATION & CURETTAGE/HYSTEROSCOPY WITH MYOSURE N/A 11/25/2018   Procedure: [place];  Surgeon: Anastasio Auerbach, MD;  Location: [place];  Service: Gynecology;  Laterality: N/A;  . HERNIA REPAIR    . HOLMIUM LASER APPLICATION Right 23/06/5730   Procedure: HOLMIUM LASER APPLICATION;  Surgeon: Bernestine Amass, MD;  Location: [facility];  Service: Urology;  Laterality: Right;  . KNEE ARTHROSCOPY Left 1992  . ROBOTIC ASSISTED TOTAL HYSTERECTOMY WITH BILATERAL SALPINGO OOPHERECTOMY N/A 01/31/2019   Procedure: XI ROBOTIC ASSISTED TOTAL HYSTERECTOMY WITH BILATERAL SALPINGO OOPHORECTOMY;  Surgeon: Everitt Amber, MD;  Location: [facility];  Service: Gynecology;  Laterality: N/A;  . ROUX-EN-Y PROCEDURE  07-25-2007  . SHOULDER ARTHROSCOPY Right 2011  . TMJ ARTHROPLASTY  1990'S   BILATERAL UPPER   Social History   Occupational History  . Not on file  Tobacco Use  . Smoking status: Never Smoker  .  Smokeless tobacco: Never Used  Vaping Use  . Vaping Use: Never used  Substance and Sexual Activity  . Alcohol use: No  . Drug use: No  . Sexual activity: Not Currently    Partners: Male    Birth control/protection: Surgical    Comment: husband vasectomy-1st intercourse 26 yo-Fewer than 5 partners

## 2020-05-10 ENCOUNTER — Ambulatory Visit (INDEPENDENT_AMBULATORY_CARE_PROVIDER_SITE_OTHER): Payer: No Typology Code available for payment source | Admitting: Internal Medicine

## 2020-05-10 ENCOUNTER — Other Ambulatory Visit: Payer: Self-pay

## 2020-05-10 ENCOUNTER — Encounter: Payer: Self-pay | Admitting: Internal Medicine

## 2020-05-10 VITALS — BP 120/90 | HR 96 | Ht 63.0 in | Wt 315.0 lb

## 2020-05-10 DIAGNOSIS — F439 Reaction to severe stress, unspecified: Secondary | ICD-10-CM

## 2020-05-10 DIAGNOSIS — F419 Anxiety disorder, unspecified: Secondary | ICD-10-CM

## 2020-05-10 DIAGNOSIS — E039 Hypothyroidism, unspecified: Secondary | ICD-10-CM | POA: Diagnosis not present

## 2020-05-10 DIAGNOSIS — R8290 Unspecified abnormal findings in urine: Secondary | ICD-10-CM

## 2020-05-10 DIAGNOSIS — Z0000 Encounter for general adult medical examination without abnormal findings: Secondary | ICD-10-CM | POA: Diagnosis not present

## 2020-05-10 DIAGNOSIS — R7302 Impaired glucose tolerance (oral): Secondary | ICD-10-CM

## 2020-05-10 DIAGNOSIS — E1169 Type 2 diabetes mellitus with other specified complication: Secondary | ICD-10-CM | POA: Diagnosis not present

## 2020-05-10 DIAGNOSIS — E559 Vitamin D deficiency, unspecified: Secondary | ICD-10-CM

## 2020-05-10 DIAGNOSIS — M160 Bilateral primary osteoarthritis of hip: Secondary | ICD-10-CM

## 2020-05-10 DIAGNOSIS — F32A Depression, unspecified: Secondary | ICD-10-CM

## 2020-05-10 DIAGNOSIS — M170 Bilateral primary osteoarthritis of knee: Secondary | ICD-10-CM

## 2020-05-10 DIAGNOSIS — Z9884 Bariatric surgery status: Secondary | ICD-10-CM

## 2020-05-10 DIAGNOSIS — M059 Rheumatoid arthritis with rheumatoid factor, unspecified: Secondary | ICD-10-CM

## 2020-05-10 DIAGNOSIS — E785 Hyperlipidemia, unspecified: Secondary | ICD-10-CM

## 2020-05-10 DIAGNOSIS — J3081 Allergic rhinitis due to animal (cat) (dog) hair and dander: Secondary | ICD-10-CM

## 2020-05-10 LAB — POCT URINALYSIS DIPSTICK
Bilirubin, UA: NEGATIVE
Blood, UA: NEGATIVE
Glucose, UA: NEGATIVE
Ketones, UA: NEGATIVE
Nitrite, UA: NEGATIVE
Protein, UA: NEGATIVE
Spec Grav, UA: 1.015 (ref 1.010–1.025)
Urobilinogen, UA: 0.2 E.U./dL
pH, UA: 6 (ref 5.0–8.0)

## 2020-05-10 MED ORDER — DIAZEPAM 5 MG PO TABS
5.0000 mg | ORAL_TABLET | Freq: Every evening | ORAL | 0 refills | Status: DC | PRN
Start: 2020-05-10 — End: 2020-08-12

## 2020-05-10 NOTE — Patient Instructions (Signed)
It was a pleasure to see you today.  Please try to work on diet.  We realize exercise is limited due to your choice issues.  Please give consideration to revision of gastric bypass.  This would help your overall health tremendously.  Fasting labs are drawn and are pending.  Follow-up in 6 months.

## 2020-05-10 NOTE — Progress Notes (Signed)
Subjective:    Patient ID: Mary Herman, female    DOB: May 02, 1960, 60 y.o.   MRN: [id number]  HPI  60 year old Female here for health maintenance exam and evaluation of medical issues.  She has a history of rheumatoid arthritis, hypothyroidism, depression, allergic rhinitis, chronic back pain, morbid obesity and vitamin D deficiency.  History of TMJ syndrome.  Status post cholecystectomy.  Had C-section in 1998.  Had arthroscopic surgery of her left knee over 25 years ago.  Had surgery for complex atypical endometrial hyperplasia including total abdominal hysterectomy BSO with no cancer in December 2020.  Sees Dr. Ninfa [place] regarding chronic pain of left knee.  Had steroid injections both knees April 15, 2020.  Diagnosed with primary osteoarthritis of both knees.  Codeine causes a rash  She is allergic to cats and has cats in her home.  Rheumatoid arthritis is treated at [facility].  She takes Enbrel.  She takes Celebrex.  She takes high-dose vitamin D weekly.  Had gastric bypass surgery in 2009 by Dr. Lamount Cohen.  Have offered Dr. Migdalia Dk clinic as an option for weight loss.  Also offered to refer her back to Dr. Hassell Done regarding gastric bypass surgery to see if there is additional option for surgery.  Currently working from home building his husband is also at home.  He works for [employer].  Social history: She is married.  Husband is disabled.  She has a high school education.  Does not smoke or consume alcohol.  She has 1 daughter who has had a number of operations over femur.  Family history: Father died at age 42 with history of alcoholism but cause of death not known to patient.  Brother with history of brain aneurysm 10 years prior to her death.  Mother died at age 44 with history of stroke and had lung disease.  No sisters.  1 brother age 44 with hypertension.  1 brother in his 78s his health status not known to patient.    Review of Systems   Constitutional: Positive for fatigue.  Respiratory: Negative.   Cardiovascular: Negative for chest pain and palpitations.  Genitourinary: Negative.   Musculoskeletal:       Bilateral osteoarthritis of both knees  Allergic/Immunologic:       Allergic to cats  Psychiatric/Behavioral:       History of anxiety and depression and situational stress       Objective:   Physical Exam Blood pressure 120/90 pulse 96 pulse oximetry 96% weight 315 pounds BMI 55.80 pulse oximetry 96%  Skin is warm and dry.  No cervical adenopathy.  No thyromegaly.  TMs are clear.  Neck is supple.  No carotid bruits.  Chest is clear to auscultation.  Breasts are pendulous without masses.  Cardiac exam: Regular rate and rhythm without murmurs.  Abdomen is obese soft nondistended without appreciable hepatosplenomegaly masses or tenderness.  No lower extremity pitting edema.  Affect thought and judgment are normal.  Neuro: No gross focal deficits on brief neurological exam.  No hot or swollen joints.       Assessment & Plan:  Rheumatoid arthritis treated by rheumatologist with DMARD  Primary osteoarthritis of both knees treated by Dr. Ninfa [place] with steroid injections  Morbid obesity with history of gastric bypass surgery.  BMI 55.80  Anxiety and depression treated with Wellbutrin and Celexa  Chronic musculoskeletal pain  Hypothyroidism stable on thyroid replacement medication  History of vitamin D deficiency.  Vitamin D level is pending  Allergic rhinitis treated with Xyzal and Singulair  Hyperlipidemia treated with Crestor  Impaired glucose tolerance treated with diet only-hemoglobin A1c pending  Abnormal urine dipstick-culture is pending.  LE noted on urine dipstick.  Asymptomatic with regards to possible UTI.  Plan: Labs to be reviewed as they are pending and drawn today.  Further recommendations to follow.  Has return appointment in 6 months.  Patient had mammogram in March.  Colonoscopy discussed  briefly.  She has had 3 COVID vaccines.  Had flu vaccine September 2021.  Had tetanus immunization in 2014.

## 2020-05-11 LAB — MICROALBUMIN / CREATININE URINE RATIO
Creatinine, Urine: 115 mg/dL (ref 20–275)
Microalb Creat Ratio: 8 mcg/mg creat (ref <30)
Microalb, Ur: 0.9 mg/dL

## 2020-05-11 LAB — COMPLETE METABOLIC PANEL WITH GFR
AG Ratio: 1.5 (calc) (ref 1.0–2.5)
ALT: 16 U/L (ref 6–29)
AST: 18 U/L (ref 10–35)
Albumin: 3.9 g/dL (ref 3.6–5.1)
Alkaline phosphatase (APISO): 109 U/L (ref 37–153)
BUN: 13 mg/dL (ref 7–25)
CO2: 27 mmol/L (ref 20–32)
Calcium: 9.2 mg/dL (ref 8.6–10.4)
Chloride: 103 mmol/L (ref 98–110)
Creat: 0.8 mg/dL (ref 0.50–1.05)
GFR, Est African American: 95 mL/min/{1.73_m2} (ref >=60)
GFR, Est Non African American: 82 mL/min/{1.73_m2} (ref >=60)
Globulin: 2.6 g/dL (calc) (ref 1.9–3.7)
Glucose, Bld: 132 mg/dL — ABNORMAL HIGH (ref 65–99)
Potassium: 4.9 mmol/L (ref 3.5–5.3)
Sodium: 141 mmol/L (ref 135–146)
Total Bilirubin: 1.1 mg/dL (ref 0.2–1.2)
Total Protein: 6.5 g/dL (ref 6.1–8.1)

## 2020-05-11 LAB — URINE CULTURE
MICRO NUMBER:: 11665077
SPECIMEN QUALITY:: ADEQUATE

## 2020-05-11 LAB — HEMOGLOBIN A1C
Hgb A1c MFr Bld: 6.1 % of total Hgb — ABNORMAL HIGH (ref <5.7)
Mean Plasma Glucose: 128 mg/dL
eAG (mmol/L): 7.1 mmol/L

## 2020-05-11 LAB — CBC WITH DIFFERENTIAL/PLATELET
Absolute Monocytes: 328 cells/uL (ref 200–950)
Basophils Absolute: 10 cells/uL (ref 0–200)
Basophils Relative: 0.2 %
Eosinophils Absolute: 187 cells/uL (ref 15–500)
Eosinophils Relative: 3.6 %
HCT: 46.9 % — ABNORMAL HIGH (ref 35.0–45.0)
Hemoglobin: 15.6 g/dL — ABNORMAL HIGH (ref 11.7–15.5)
Lymphs Abs: 1446 cells/uL (ref 850–3900)
MCH: 30.3 pg (ref 27.0–33.0)
MCHC: 33.3 g/dL (ref 32.0–36.0)
MCV: 91.1 fL (ref 80.0–100.0)
MPV: 10.4 fL (ref 7.5–12.5)
Monocytes Relative: 6.3 %
Neutro Abs: 3229 cells/uL (ref 1500–7800)
Neutrophils Relative %: 62.1 %
Platelets: 256 10*3/uL (ref 140–400)
RBC: 5.15 10*6/uL — ABNORMAL HIGH (ref 3.80–5.10)
RDW: 12.2 % (ref 11.0–15.0)
Total Lymphocyte: 27.8 %
WBC: 5.2 10*3/uL (ref 3.8–10.8)

## 2020-05-11 LAB — URINALYSIS, MICROSCOPIC ONLY
Bacteria, UA: NONE SEEN /HPF
Hyaline Cast: NONE SEEN /LPF
RBC / HPF: NONE SEEN /HPF (ref 0–2)

## 2020-05-11 LAB — LIPID PANEL
Cholesterol: 178 mg/dL (ref <200)
HDL: 85 mg/dL (ref >=50)
LDL Cholesterol (Calc): 73 mg/dL (calc)
Non-HDL Cholesterol (Calc): 93 mg/dL (calc) (ref <130)
Total CHOL/HDL Ratio: 2.1 (calc) (ref <5.0)
Triglycerides: 123 mg/dL (ref <150)

## 2020-05-11 LAB — VITAMIN D 25 HYDROXY (VIT D DEFICIENCY, FRACTURES): Vit D, 25-Hydroxy: 64 ng/mL (ref 30–100)

## 2020-05-11 LAB — TSH: TSH: 2.31 mIU/L (ref 0.40–4.50)

## 2020-06-22 ENCOUNTER — Other Ambulatory Visit: Payer: Self-pay | Admitting: Internal Medicine

## 2020-07-16 ENCOUNTER — Encounter: Payer: Self-pay | Admitting: Orthopaedic Surgery

## 2020-07-16 ENCOUNTER — Ambulatory Visit: Payer: No Typology Code available for payment source | Admitting: Orthopaedic Surgery

## 2020-07-16 VITALS — Ht 63.0 in | Wt 327.6 lb

## 2020-07-16 DIAGNOSIS — Z6841 Body Mass Index (BMI) 40.0 and over, adult: Secondary | ICD-10-CM | POA: Diagnosis not present

## 2020-07-16 DIAGNOSIS — M25561 Pain in right knee: Secondary | ICD-10-CM | POA: Diagnosis not present

## 2020-07-16 DIAGNOSIS — G8929 Other chronic pain: Secondary | ICD-10-CM

## 2020-07-16 DIAGNOSIS — M25562 Pain in left knee: Secondary | ICD-10-CM | POA: Diagnosis not present

## 2020-07-16 MED ORDER — METHYLPREDNISOLONE ACETATE 40 MG/ML IJ SUSP
40.0000 mg | INTRAMUSCULAR | Status: AC | PRN
Start: 2020-07-16 — End: 2020-07-16
  Administered 2020-07-16: 40 mg via INTRA_ARTICULAR

## 2020-07-16 MED ORDER — LIDOCAINE HCL 1 % IJ SOLN
3.0000 mL | INTRAMUSCULAR | Status: AC | PRN
  Administered 2020-07-16: 3 mL

## 2020-07-16 MED ORDER — METHYLPREDNISOLONE ACETATE 40 MG/ML IJ SUSP
40.0000 mg | INTRAMUSCULAR | Status: AC | PRN
  Administered 2020-07-16: 40 mg via INTRA_ARTICULAR

## 2020-07-16 NOTE — Progress Notes (Signed)
Office Visit Note   Patient: Mary Herman           Date of Birth: [birth year]           MRN: [id number] Visit Date: 07/16/2020              Requested by: Elby Showers, MD [address] [place],  [place] [address] PCP: Elby Showers, MD   Assessment & Plan: Visit Diagnoses:  1. Chronic pain of left knee   2. Chronic pain of right knee   3. Morbid (severe) obesity due to excess calories ([place])     Plan: Per the patient's request I did place a steroid injection in both knees.  The risk and benefits of injections were described in detail.  We can always do this in 3 to 4 months again if needed.  Follow-Up Instructions: Return if symptoms worsen or fail to improve.   Orders:  Orders Placed This Encounter  Procedures  . Large Joint Inj  . Large Joint Inj   No orders of the defined types were placed in this encounter.     Procedures: Large Joint Inj: R knee on 07/16/2020 8:51 AM Indications: diagnostic evaluation and pain Details: 22 G 1.5 in needle, superolateral approach  Arthrogram: No  Medications: 3 mL lidocaine 1 %; 40 mg methylPREDNISolone acetate 40 MG/ML Outcome: tolerated well, no immediate complications Procedure, treatment alternatives, risks and benefits explained, specific risks discussed. Consent was given by the patient. Immediately prior to procedure a time out was called to verify the correct patient, procedure, equipment, support staff and site/side marked as required. Patient was prepped and draped in the usual sterile fashion.   Large Joint Inj: L knee on 07/16/2020 8:51 AM Indications: diagnostic evaluation and pain Details: 22 G 1.5 in needle, superolateral approach  Arthrogram: No  Medications: 3 mL lidocaine 1 %; 40 mg methylPREDNISolone acetate 40 MG/ML Outcome: tolerated well, no immediate complications Procedure, treatment alternatives, risks and benefits explained, specific risks discussed. Consent was given by the patient.  Immediately prior to procedure a time out was called to verify the correct patient, procedure, equipment, support staff and site/side marked as required. Patient was prepped and draped in the usual sterile fashion.       Clinical Data: No additional findings.   Subjective: Chief Complaint  Patient presents with  . Right Knee - Pain  . Left Knee - Pain  The patient is well-known to me.  She has debilitating arthritis of both her knees and comes in from time to time for steroid injections in both knees to try to calm down the pain that she gets in both knees.  She has had no acute change in medical status.  Her knees hurt significantly.  She has a BMI today of 58.05 and she understands that she is not a surgical candidate due to his weight.  I counseled her again about weight loss.  Since it has been over 74-month since her last steroid injections I am fine with placing this in both knees.  There is been no other acute change in her medical status.  HPI  Review of Systems She currently denies any headache, chest pain, shortness of breath, fever, chills, nausea, vomiting  Objective: Vital Signs: Ht 5\' 3"  (1.6 m)   Wt (!) 327 lb 9.6 oz (148.6 kg)   LMP 05/25/2014   BMI 58.03 kg/m   Physical Exam She is alert and oriented x3 and in no acute distress Ortho  Exam Examination of both knees shows varus malalignment and global tenderness throughout the arc of motion.  There is medial lateral joint line tenderness on both knees with patellofemoral crepitation. Specialty Comments:  No specialty comments available.  Imaging: No results found.   PMFS History: Patient Active Problem List   Diagnosis Date Noted  . Complex atypical endometrial hyperplasia 01/31/2019  . Morbid obesity ([place]) 10/11/2018  . Rheumatoid arthritis ([place]) 12/19/2016  . OA (osteoarthritis) of knee 04/07/2016  . Hypothyroidism 01/22/2015  . Chronic low back pain 01/22/2015  . Depression 01/22/2015  . Allergic  rhinitis 01/22/2015  . Ureteral calculus 12/26/2012  . Personal history of gastric bypass 11/12/2012  . Hyperlipidemia    Past Medical History:  Diagnosis Date  . Anemia   . Chronic back pain   . Depression   . Dyspnea    with activity  . Environmental allergies   . Family history of adverse reaction to anesthesia    daughter- n/v      . Fibroid   . GERD (gastroesophageal reflux disease)   . Hematuria   . History of cystitis    INTERSTITIAL CYSTITIS--  NO ISSUES FOR YRS  . History of gastroesophageal reflux (GERD)    DENIES ISSUES SINCE GASTRIC BY PASS  . History of hypertension    NO ISSUE SINCE WT LOSS AFTER GASTRIC BYPASS  . History of kidney stones   . History of obstructive sleep apnea    USED CPAP--  NO ISSUE SINCE WT LOSS AFTER GASTRIC BYPASS  . Hypertension   . Pneumonia   . Pre-diabetes    pt. denies  . Rheumatoid arthritis ([place])   . Right ureteral stone   . Sleep apnea    no cpap   . Thyroid disease   . Trochanteric bursitis of both hips     Family History  Problem Relation Age of Onset  . Hypertension Mother   . Stroke Mother   . Hypertension Brother   . Heart disease Maternal Uncle   . Diabetes Maternal Grandmother   . Cancer Maternal Grandmother        THROAT CA  . Cancer Maternal Grandfather        THROAT CA    Past Surgical History:  Procedure Laterality Date  . CARDIAC CATHETERIZATION  12-07-2006  [facility]   NORMAL CORONARIES ARTERIES/ NORMAL LVF/ MILD INCREASED END-DIASTOLIC PRESSURE  . CESAREAN SECTION  1998  . CHOLECYSTECTOMY  1980'S  . CYSTOSCOPY WITH RETROGRADE PYELOGRAM, URETEROSCOPY AND STENT PLACEMENT Bilateral 12/26/2012   Procedure: cystoscopy with bilateral retrogrades, bilateral ureteroscopy ;  Surgeon: Bernestine Amass, MD;  Location: [facility];  Service: Urology;  Laterality: Bilateral;  . CYSTOSCOPY WITH STENT PLACEMENT Bilateral 12/26/2012   Procedure: CYSTOSCOPY WITH STENT PLACEMENT;  Surgeon: Bernestine Amass,  MD;  Location: [facility];  Service: Urology;  Laterality: Bilateral;  . CYSTOSCOPY/ HYDRODISTENTION  YRS AGO  . DILATATION & CURETTAGE/HYSTEROSCOPY WITH MYOSURE N/A 11/25/2018   Procedure: [place];  Surgeon: Anastasio Auerbach, MD;  Location: [facility];  Service: Gynecology;  Laterality: N/A;  . HERNIA REPAIR    . HOLMIUM LASER APPLICATION Right 34/02/9377   Procedure: HOLMIUM LASER APPLICATION;  Surgeon: Bernestine Amass, MD;  Location: [facility];  Service: Urology;  Laterality: Right;  . KNEE ARTHROSCOPY Left 1992  . ROBOTIC ASSISTED TOTAL HYSTERECTOMY WITH BILATERAL SALPINGO OOPHERECTOMY N/A 01/31/2019   Procedure: XI ROBOTIC ASSISTED TOTAL HYSTERECTOMY WITH BILATERAL  SALPINGO OOPHORECTOMY;  Surgeon: Everitt Amber, MD;  Location: [facility];  Service: Gynecology;  Laterality: N/A;  . ROUX-EN-Y PROCEDURE  07-25-2007  . SHOULDER ARTHROSCOPY Right 2011  . TMJ ARTHROPLASTY  1990'S   BILATERAL UPPER   Social History   Occupational History  . Not on file  Tobacco Use  . Smoking status: Never Smoker  . Smokeless tobacco: Never Used  Vaping Use  . Vaping Use: Never used  Substance and Sexual Activity  . Alcohol use: No  . Drug use: No  . Sexual activity: Not Currently    Partners: Male    Birth control/protection: Surgical    Comment: husband vasectomy-1st intercourse 26 yo-Fewer than 5 partners

## 2020-07-31 ENCOUNTER — Telehealth: Payer: Self-pay | Admitting: Orthopaedic Surgery

## 2020-07-31 MED ORDER — TRAMADOL HCL 50 MG PO TABS: 100.0000 mg | ORAL_TABLET | Freq: Four times a day (QID) | ORAL | 0 refills | Status: DC | PRN

## 2020-07-31 MED ORDER — ACETAMINOPHEN-CODEINE #3 300-30 MG PO TABS: 1.0000 | ORAL_TABLET | Freq: Three times a day (TID) | ORAL | 0 refills | Status: DC | PRN

## 2020-07-31 NOTE — Telephone Encounter (Signed)
Pt called stating Dr. Ninfa Linden said he would call in a pain rx when she was getting ready to go on vacation and she would like a CB from [place] to discuss the rx.   [phone]

## 2020-07-31 NOTE — Telephone Encounter (Signed)
I sent in some Tylenol 3 with codeine.

## 2020-07-31 NOTE — Telephone Encounter (Signed)
She's allergic to codeine  Tramadol?

## 2020-07-31 NOTE — Telephone Encounter (Signed)
Patient called. Says she is allergic to codeine. She can only take Tramadol. Her call back number is [phone]

## 2020-07-31 NOTE — Telephone Encounter (Signed)
I sent some in 

## 2020-07-31 NOTE — Telephone Encounter (Signed)
Called and informed.

## 2020-07-31 NOTE — Telephone Encounter (Signed)
See other message

## 2020-08-11 ENCOUNTER — Other Ambulatory Visit: Payer: Self-pay | Admitting: Internal Medicine

## 2020-08-12 ENCOUNTER — Other Ambulatory Visit: Payer: Self-pay | Admitting: Internal Medicine

## 2020-08-12 DIAGNOSIS — R8290 Unspecified abnormal findings in urine: Secondary | ICD-10-CM

## 2020-09-13 ENCOUNTER — Other Ambulatory Visit: Payer: Self-pay | Admitting: Internal Medicine

## 2020-09-28 ENCOUNTER — Other Ambulatory Visit: Payer: Self-pay | Admitting: Internal Medicine

## 2020-10-16 ENCOUNTER — Encounter: Payer: Self-pay | Admitting: Orthopaedic Surgery

## 2020-10-16 ENCOUNTER — Ambulatory Visit: Payer: No Typology Code available for payment source | Admitting: Orthopaedic Surgery

## 2020-10-16 DIAGNOSIS — M1712 Unilateral primary osteoarthritis, left knee: Secondary | ICD-10-CM

## 2020-10-16 DIAGNOSIS — G8929 Other chronic pain: Secondary | ICD-10-CM | POA: Diagnosis not present

## 2020-10-16 DIAGNOSIS — M1711 Unilateral primary osteoarthritis, right knee: Secondary | ICD-10-CM

## 2020-10-16 DIAGNOSIS — M25561 Pain in right knee: Secondary | ICD-10-CM | POA: Diagnosis not present

## 2020-10-16 DIAGNOSIS — M25562 Pain in left knee: Secondary | ICD-10-CM | POA: Diagnosis not present

## 2020-10-16 MED ORDER — METHYLPREDNISOLONE ACETATE 40 MG/ML IJ SUSP
40.0000 mg | INTRAMUSCULAR | Status: AC | PRN
  Administered 2020-10-16: 40 mg via INTRA_ARTICULAR

## 2020-10-16 MED ORDER — LIDOCAINE HCL 1 % IJ SOLN
3.0000 mL | INTRAMUSCULAR | Status: AC | PRN
  Administered 2020-10-16: 3 mL

## 2020-10-16 NOTE — Progress Notes (Signed)
Office Visit Note   Patient: Mary Herman           Date of Birth: 1960-07-18           MRN: [id number] Visit Date: 10/16/2020              Requested by: Elby Showers, MD [address] [place],  [place] [address] PCP: Elby Showers, MD   Assessment & Plan: Visit Diagnoses:  1. Chronic pain of left knee   2. Chronic pain of right knee   3. Morbid (severe) obesity due to excess calories ([place])   4. Unilateral primary osteoarthritis, left knee   5. Unilateral primary osteoarthritis, right knee     Plan: I did provide a steroid injection of both knees per her request today.  We can do this again in 3 months.  At the next visit it would be nice to have a new weight and BMI calculation.  No x-rays are needed.  Follow-Up Instructions: Return in about 3 months (around 01/16/2021).   Orders:  Orders Placed This Encounter  Procedures   Large Joint Inj   Large Joint Inj   No orders of the defined types were placed in this encounter.     Procedures: Large Joint Inj: R knee on 10/16/2020 10:26 AM Indications: diagnostic evaluation and pain Details: 22 G 1.5 in needle, superolateral approach  Arthrogram: No  Medications: 3 mL lidocaine 1 %; 40 mg methylPREDNISolone acetate 40 MG/ML Outcome: tolerated well, no immediate complications Procedure, treatment alternatives, risks and benefits explained, specific risks discussed. Consent was given by the patient. Immediately prior to procedure a time out was called to verify the correct patient, procedure, equipment, support staff and site/side marked as required. Patient was prepped and draped in the usual sterile fashion.    Large Joint Inj: L knee on 10/16/2020 10:27 AM Indications: diagnostic evaluation and pain Details: 22 G 1.5 in needle, superolateral approach  Arthrogram: No  Medications: 3 mL lidocaine 1 %; 40 mg methylPREDNISolone acetate 40 MG/ML Outcome: tolerated well, no immediate complications Procedure,  treatment alternatives, risks and benefits explained, specific risks discussed. Consent was given by the patient. Immediately prior to procedure a time out was called to verify the correct patient, procedure, equipment, support staff and site/side marked as required. Patient was prepped and draped in the usual sterile fashion.      Clinical Data: No additional findings.   Subjective: Chief Complaint  Patient presents with   Right Knee - Follow-up   Left Knee - Follow-up  The patient comes in today for repeat steroid injections in both her knees.  She has severe end-stage arthritis in both knees.  She has a lot of comorbidities that are preventing her from having knee replacement surgery and her BMI is well over 40.  It has been 3 months since we injected her knees before.  They do help for several weeks.  She is having a dental procedure with teeth pulled next week.  She has a lot of pain with weightbearing of both knees and well-documented end-stage arthritis of both knees.  We have seen her for many years now.  HPI  Review of Systems There is currently listed no fever, chills, nausea, vomiting  Objective: Vital Signs: LMP 05/25/2014   Physical Exam She is alert and orient x3 and in no acute distress Ortho Exam Both knees have a large soft tissue envelope with painful range of motion.  Both knees have varus malalignment. Specialty  Comments:  No specialty comments available.  Imaging: No results found.   PMFS History: Patient Active Problem List   Diagnosis Date Noted   Complex atypical endometrial hyperplasia 01/31/2019   Morbid obesity ([place]) 10/11/2018   Rheumatoid arthritis ([place]) 12/19/2016   OA (osteoarthritis) of knee 04/07/2016   Hypothyroidism 01/22/2015   Chronic low back pain 01/22/2015   Depression 01/22/2015   Allergic rhinitis 01/22/2015   Ureteral calculus 12/26/2012   Personal history of gastric bypass 11/12/2012   Hyperlipidemia    Past Medical  History:  Diagnosis Date   Anemia    Chronic back pain    Depression    Dyspnea    with activity   Environmental allergies    Family history of adverse reaction to anesthesia    daughter- n/v       Fibroid    GERD (gastroesophageal reflux disease)    Hematuria    History of cystitis    INTERSTITIAL CYSTITIS--  NO ISSUES FOR YRS   History of gastroesophageal reflux (GERD)    DENIES ISSUES SINCE GASTRIC BY PASS   History of hypertension    NO ISSUE SINCE WT LOSS AFTER GASTRIC BYPASS   History of kidney stones    History of obstructive sleep apnea    USED CPAP--  NO ISSUE SINCE WT LOSS AFTER GASTRIC BYPASS   Hypertension    Pneumonia    Pre-diabetes    pt. denies   Rheumatoid arthritis ([place])    Right ureteral stone    Sleep apnea    no cpap    Thyroid disease    Trochanteric bursitis of both hips     Family History  Problem Relation Age of Onset   Hypertension Mother    Stroke Mother    Hypertension Brother    Heart disease Maternal Uncle    Diabetes Maternal Grandmother    Cancer Maternal Grandmother        THROAT CA   Cancer Maternal Grandfather        THROAT CA    Past Surgical History:  Procedure Laterality Date   CARDIAC CATHETERIZATION  12-07-2006  DR [facility]   NORMAL CORONARIES ARTERIES/ NORMAL LVF/ MILD INCREASED END-DIASTOLIC PRESSURE   CESAREAN SECTION  1998   CHOLECYSTECTOMY  1980'S   CYSTOSCOPY WITH RETROGRADE PYELOGRAM, URETEROSCOPY AND STENT PLACEMENT Bilateral 12/26/2012   Procedure: cystoscopy with bilateral retrogrades, bilateral ureteroscopy ;  Surgeon: Bernestine Amass, MD;  Location: [facility];  Service: Urology;  Laterality: Bilateral;   CYSTOSCOPY WITH STENT PLACEMENT Bilateral 12/26/2012   Procedure: CYSTOSCOPY WITH STENT PLACEMENT;  Surgeon: Bernestine Amass, MD;  Location: [facility];  Service: Urology;  Laterality: Bilateral;   CYSTOSCOPY/ HYDRODISTENTION  YRS AGO   DILATATION & CURETTAGE/HYSTEROSCOPY WITH  MYOSURE N/A 11/25/2018   Procedure: [place];  Surgeon: Anastasio Auerbach, MD;  Location: [facility];  Service: Gynecology;  Laterality: N/A;   HERNIA REPAIR     HOLMIUM LASER APPLICATION Right 0000000   Procedure: HOLMIUM LASER APPLICATION;  Surgeon: Bernestine Amass, MD;  Location: [facility];  Service: Urology;  Laterality: Right;   KNEE ARTHROSCOPY Left 1992   ROBOTIC ASSISTED TOTAL HYSTERECTOMY WITH BILATERAL SALPINGO OOPHERECTOMY N/A 01/31/2019   Procedure: XI ROBOTIC ASSISTED TOTAL HYSTERECTOMY WITH BILATERAL SALPINGO OOPHORECTOMY;  Surgeon: Everitt Amber, MD;  Location: [facility];  Service: Gynecology;  Laterality: N/A;   ROUX-EN-Y PROCEDURE  07-25-2007   SHOULDER ARTHROSCOPY Right 2011  TMJ ARTHROPLASTY  1990'S   BILATERAL UPPER   Social History   Occupational History   Not on file  Tobacco Use   Smoking status: Never   Smokeless tobacco: Never  Vaping Use   Vaping Use: Never used  Substance and Sexual Activity   Alcohol use: No   Drug use: No   Sexual activity: Not Currently    Partners: Male    Birth control/protection: Surgical    Comment: husband vasectomy-1st intercourse 26 yo-Fewer than 5 partners

## 2020-11-05 ENCOUNTER — Encounter: Payer: Self-pay | Admitting: Internal Medicine

## 2020-11-05 ENCOUNTER — Other Ambulatory Visit: Payer: Self-pay

## 2020-11-05 ENCOUNTER — Ambulatory Visit: Payer: No Typology Code available for payment source | Admitting: Internal Medicine

## 2020-11-05 VITALS — BP 116/80 | HR 102 | Ht 63.0 in | Wt 300.0 lb

## 2020-11-05 DIAGNOSIS — F419 Anxiety disorder, unspecified: Secondary | ICD-10-CM | POA: Diagnosis not present

## 2020-11-05 DIAGNOSIS — Z23 Encounter for immunization: Secondary | ICD-10-CM

## 2020-11-05 DIAGNOSIS — M059 Rheumatoid arthritis with rheumatoid factor, unspecified: Secondary | ICD-10-CM

## 2020-11-05 DIAGNOSIS — R7302 Impaired glucose tolerance (oral): Secondary | ICD-10-CM

## 2020-11-05 DIAGNOSIS — E039 Hypothyroidism, unspecified: Secondary | ICD-10-CM | POA: Diagnosis not present

## 2020-11-05 DIAGNOSIS — M170 Bilateral primary osteoarthritis of knee: Secondary | ICD-10-CM

## 2020-11-05 DIAGNOSIS — Z9884 Bariatric surgery status: Secondary | ICD-10-CM

## 2020-11-05 DIAGNOSIS — F32A Depression, unspecified: Secondary | ICD-10-CM

## 2020-11-05 LAB — TSH: TSH: 0.53 mIU/L (ref 0.40–4.50)

## 2020-11-05 NOTE — Progress Notes (Signed)
   Subjective:    Patient ID: Mary Herman, female    DOB: 1960-09-05, 60 y.o.   MRN: [id number]  HPI  Here for 6  month follow up appt. Now on Humira  per Rheumatologist in [place] for Rheumatoid arthritis.  MTX was added and had allergic reaction. [place]ill on [place]. Still on folic acid.  She has a history of hypothyroidism, depression, allergic rhinitis, chronic back pain, morbid obesity, vitamin D deficiency.  Has chronic pain of left knee seen by Dr. Ninfa Linden and had steroid injections in both knees in February 2022.  History of gastric bypass surgery in 2009 by Dr. Hassell Done.  We have talked about Dr. [facility] as an option for weight loss but she has not really been motivated to diet or exercise.  Hysterectomy with BSO for complex atypical endometrial hyperplasia with no cancer being found.  Still working from home. Advise Covid booster for October. Has not had Covid-19 virus infection.   In March weighed 315 pounds and now weighs 300 pounds but had dental extractions 10 teeth maxillary area. Will get denture for upper mouth in the first of year.  Flu vaccine given.  Review of Systems see above     Objective:   Physical Exam  BP 116/80 pulse 102 pulse 92% weight 300 pounds  Neck is supple without thyromegaly.  Chest is clear to auscultation.  Cardiac exam: Regular rate and rhythm.  No lower extremity pitting edema.     Assessment & Plan:  Morbid obesity-unable to exercise due to knee pain  History of gastric bypass surgery done in 2009 by Dr. Hassell Done.  Perhaps revision could be considered.  Anxiety and depression treated with Wellbutrin and Celexa.  Osteoarthritis of knees followed by Dr. Ninfa Linden.  History of rheumatoid arthritis treated by rheumatology  Hypothyroidism-stable on thyroid replacement medication.  TSH is 0.53  History of vitamin D deficiency treated with high-dose weekly vitamin D supplement  Impaired glucose tolerance-hemoglobin A1c  6.1% when checked in March  Plan: Continue to encourage her to be more motivated to diet and exercise.  May want to speak with Dr. Hassell Done regarding gastric bypass surgery and if revision is an option.  Continue current medications and follow-up in 6 months.  Flu vaccine given.

## 2020-11-22 ENCOUNTER — Encounter: Payer: Self-pay | Admitting: Internal Medicine

## 2020-11-22 NOTE — Patient Instructions (Signed)
Continue to work with exercise and diet if possible.  May want to consider consultation with Dr. Hassell Done regarding gastric bypass surgery to see if revision is an option.  Continue current medications and follow-up in 6 months.  Flu vaccine given.  Hemoglobin A1c stable at 6.1%.  Continue weekly vitamin D supplement and thyroid replacement medication.  Continue to be treated for rheumatoid arthritis by [facility] rheumatology.

## 2020-11-27 ENCOUNTER — Other Ambulatory Visit: Payer: Self-pay | Admitting: Internal Medicine

## 2020-11-27 DIAGNOSIS — R8290 Unspecified abnormal findings in urine: Secondary | ICD-10-CM

## 2020-12-05 ENCOUNTER — Other Ambulatory Visit: Payer: Self-pay | Admitting: Internal Medicine

## 2021-01-21 ENCOUNTER — Ambulatory Visit: Payer: No Typology Code available for payment source | Admitting: Orthopaedic Surgery

## 2021-01-22 ENCOUNTER — Ambulatory Visit: Payer: No Typology Code available for payment source | Admitting: Orthopaedic Surgery

## 2021-01-22 ENCOUNTER — Encounter: Payer: Self-pay | Admitting: Orthopaedic Surgery

## 2021-01-22 DIAGNOSIS — M25561 Pain in right knee: Secondary | ICD-10-CM

## 2021-01-22 DIAGNOSIS — G8929 Other chronic pain: Secondary | ICD-10-CM | POA: Diagnosis not present

## 2021-01-22 DIAGNOSIS — M25562 Pain in left knee: Secondary | ICD-10-CM | POA: Diagnosis not present

## 2021-01-22 MED ORDER — LIDOCAINE HCL 1 % IJ SOLN
3.0000 mL | INTRAMUSCULAR | Status: AC | PRN
Start: 2021-01-22 — End: 2021-01-22
  Administered 2021-01-22: 3 mL

## 2021-01-22 MED ORDER — METHYLPREDNISOLONE ACETATE 40 MG/ML IJ SUSP
40.0000 mg | INTRAMUSCULAR | Status: AC | PRN
  Administered 2021-01-22: 40 mg via INTRA_ARTICULAR

## 2021-01-22 MED ORDER — LIDOCAINE HCL 1 % IJ SOLN
3.0000 mL | INTRAMUSCULAR | Status: AC | PRN
  Administered 2021-01-22: 3 mL

## 2021-01-22 MED ORDER — METHYLPREDNISOLONE ACETATE 40 MG/ML IJ SUSP
40.0000 mg | INTRAMUSCULAR | Status: AC | PRN
Start: 2021-01-22 — End: 2021-01-22
  Administered 2021-01-22: 40 mg via INTRA_ARTICULAR

## 2021-01-22 NOTE — Progress Notes (Signed)
Office Visit Note   Patient: Mary Herman           Date of Birth: 06/01/1960           MRN: [id number] Visit Date: 01/22/2021              Requested by: Elby Showers, MD [address] [place],  [place] [id number] PCP: Elby Showers, MD   Assessment & Plan: Visit Diagnoses:  1. Chronic pain of left knee   2. Chronic pain of right knee     Plan: Per the patient's request I did place a steroid injection in both knees today that she tolerated well.  We will see her back in 3 months.  At that visit I would like a weight and BMI calculation.  Follow-Up Instructions: Return in about 3 months (around 04/22/2021).   Orders:  Orders Placed This Encounter  Procedures   Large Joint Inj   Large Joint Inj   No orders of the defined types were placed in this encounter.     Procedures: Large Joint Inj: R knee on 01/22/2021 9:34 AM Indications: diagnostic evaluation and pain Details: 22 G 1.5 in needle, superolateral approach  Arthrogram: No  Medications: 3 mL lidocaine 1 %; 40 mg methylPREDNISolone acetate 40 MG/ML Outcome: tolerated well, no immediate complications Procedure, treatment alternatives, risks and benefits explained, specific risks discussed. Consent was given by the patient. Immediately prior to procedure a time out was called to verify the correct patient, procedure, equipment, support staff and site/side marked as required. Patient was prepped and draped in the usual sterile fashion.    Large Joint Inj: L knee on 01/22/2021 9:34 AM Indications: diagnostic evaluation and pain Details: 22 G 1.5 in needle, superolateral approach  Arthrogram: No  Medications: 3 mL lidocaine 1 %; 40 mg methylPREDNISolone acetate 40 MG/ML Outcome: tolerated well, no immediate complications Procedure, treatment alternatives, risks and benefits explained, specific risks discussed. Consent was given by the patient. Immediately prior to procedure a time out was called to verify  the correct patient, procedure, equipment, support staff and site/side marked as required. Patient was prepped and draped in the usual sterile fashion.      Clinical Data: No additional findings.   Subjective: Chief Complaint  Patient presents with   Left Knee - Pain, Follow-up   Right Knee - Pain, Follow-up  The patient is well-known to me.  She has end-stage arthritis of both her knees and comes in about every 3 months for steroid injections in both her knees.  She is not a diabetic.  She has been morbidly obese.  She is actually lost weight having had her teeth removed.  She has had no other acute change in her medical status.  HPI  Review of Systems There is currently no fever, chills, nausea, vomiting  Objective: Vital Signs: LMP 05/25/2014   Physical Exam She is alert and orient x3 and in no acute distress Ortho Exam Both knees have varus malalignment and significant medial and lateral joint line tenderness.  Both knees have significant patellofemoral crepitation throughout the arc of motion.  Her soft tissue envelope around both knees has gotten significantly less. Specialty Comments:  No specialty comments available.  Imaging: No results found.   PMFS History: Patient Active Problem List   Diagnosis Date Noted   Complex atypical endometrial hyperplasia 01/31/2019   Morbid obesity ([place]) 10/11/2018   Rheumatoid arthritis ([place]) 12/19/2016   OA (osteoarthritis) of knee 04/07/2016   Hypothyroidism  01/22/2015   Chronic low back pain 01/22/2015   Depression 01/22/2015   Allergic rhinitis 01/22/2015   Ureteral calculus 12/26/2012   Personal history of gastric bypass 11/12/2012   Hyperlipidemia    Past Medical History:  Diagnosis Date   Anemia    Chronic back pain    Depression    Dyspnea    with activity   Environmental allergies    Family history of adverse reaction to anesthesia    daughter- n/v       Fibroid    GERD (gastroesophageal reflux disease)     Hematuria    History of cystitis    INTERSTITIAL CYSTITIS--  NO ISSUES FOR YRS   History of gastroesophageal reflux (GERD)    DENIES ISSUES SINCE GASTRIC BY PASS   History of hypertension    NO ISSUE SINCE WT LOSS AFTER GASTRIC BYPASS   History of kidney stones    History of obstructive sleep apnea    USED CPAP--  NO ISSUE SINCE WT LOSS AFTER GASTRIC BYPASS   Hypertension    Pneumonia    Pre-diabetes    pt. denies   Rheumatoid arthritis ([place])    Right ureteral stone    Sleep apnea    no cpap    Thyroid disease    Trochanteric bursitis of both hips     Family History  Problem Relation Age of Onset   Hypertension Mother    Stroke Mother    Hypertension Brother    Heart disease Maternal Uncle    Diabetes Maternal Grandmother    Cancer Maternal Grandmother        THROAT CA   Cancer Maternal Grandfather        THROAT CA    Past Surgical History:  Procedure Laterality Date   CARDIAC CATHETERIZATION  12-07-2006  [facility]   NORMAL CORONARIES ARTERIES/ NORMAL LVF/ MILD INCREASED END-DIASTOLIC PRESSURE   CESAREAN SECTION  1998   CHOLECYSTECTOMY  1980'S   CYSTOSCOPY WITH RETROGRADE PYELOGRAM, URETEROSCOPY AND STENT PLACEMENT Bilateral 12/26/2012   Procedure: cystoscopy with bilateral retrogrades, bilateral ureteroscopy ;  Surgeon: Bernestine Amass, MD;  Location: [facility];  Service: Urology;  Laterality: Bilateral;   CYSTOSCOPY WITH STENT PLACEMENT Bilateral 12/26/2012   Procedure: CYSTOSCOPY WITH STENT PLACEMENT;  Surgeon: Bernestine Amass, MD;  Location: [facility];  Service: Urology;  Laterality: Bilateral;   CYSTOSCOPY/ HYDRODISTENTION  YRS AGO   DILATATION & CURETTAGE/HYSTEROSCOPY WITH MYOSURE N/A 11/25/2018   Procedure: [place];  Surgeon: Anastasio Auerbach, MD;  Location: [facility];  Service: Gynecology;  Laterality: N/A;   HERNIA REPAIR     HOLMIUM LASER APPLICATION Right 50/03/7739   Procedure: HOLMIUM  LASER APPLICATION;  Surgeon: Bernestine Amass, MD;  Location: [facility];  Service: Urology;  Laterality: Right;   KNEE ARTHROSCOPY Left 1992   ROBOTIC ASSISTED TOTAL HYSTERECTOMY WITH BILATERAL SALPINGO OOPHERECTOMY N/A 01/31/2019   Procedure: XI ROBOTIC ASSISTED TOTAL HYSTERECTOMY WITH BILATERAL SALPINGO OOPHORECTOMY;  Surgeon: Everitt Amber, MD;  Location: [facility];  Service: Gynecology;  Laterality: N/A;   ROUX-EN-Y PROCEDURE  07-25-2007   SHOULDER ARTHROSCOPY Right 2011   TMJ ARTHROPLASTY  1990'S   BILATERAL UPPER   Social History   Occupational History   Not on file  Tobacco Use   Smoking status: Never   Smokeless tobacco: Never  Vaping Use   Vaping Use: Never used  Substance and Sexual Activity   Alcohol use: No  Drug use: No   Sexual activity: Not Currently    Partners: Male    Birth control/protection: Surgical    Comment: husband vasectomy-1st intercourse 26 yo-Fewer than 5 partners

## 2021-02-03 ENCOUNTER — Other Ambulatory Visit: Payer: Self-pay | Admitting: Internal Medicine

## 2021-02-11 ENCOUNTER — Ambulatory Visit (INDEPENDENT_AMBULATORY_CARE_PROVIDER_SITE_OTHER): Payer: No Typology Code available for payment source

## 2021-02-11 ENCOUNTER — Other Ambulatory Visit: Payer: Self-pay

## 2021-02-11 ENCOUNTER — Ambulatory Visit: Payer: No Typology Code available for payment source | Admitting: Physician Assistant

## 2021-02-11 ENCOUNTER — Encounter: Payer: Self-pay | Admitting: Physician Assistant

## 2021-02-11 VITALS — Ht 63.0 in | Wt 312.2 lb

## 2021-02-11 DIAGNOSIS — M79605 Pain in left leg: Secondary | ICD-10-CM

## 2021-02-11 MED ORDER — HYDROCODONE-ACETAMINOPHEN 5-325 MG PO TABS: 1.0000 | ORAL_TABLET | Freq: Four times a day (QID) | ORAL | 0 refills | Status: DC | PRN

## 2021-02-11 NOTE — Progress Notes (Signed)
Office Visit Note   Patient: Mary Herman           Date of Birth: 07/15/1960           MRN: [id number] Visit Date: 02/11/2021              Requested by: Elby Showers, MD [address] [place],  [place] [address] PCP: Elby Showers, MD   Assessment & Plan: Visit Diagnoses:  1. Pain in left leg     Plan: Discussed with the patient that her arthritis is definitely became worse since last imaged particularly patellofemoral arthritic changes.  Recommend continued weight loss discussed with her sending her to weight loss clinic she defers.  She did asked to be sent to pain clinic and asked for some pain medication to get her through until she can get a pain clinic.  I did give her some hydrocodone which she is to take sparingly.  Recommended use of Voltaren gel on the knee.  Questions were encouraged and answered  Follow-Up Instructions: No follow-ups on file.   Orders:  Orders Placed This Encounter  Procedures   XR Tibia/Fibula Left   No orders of the defined types were placed in this encounter.     Procedures: No procedures performed   Clinical Data: No additional findings.   Subjective: Chief Complaint  Patient presents with   Left Leg - Pain    HPI Mary Herman returns today for left knee pain and swelling.  She was last seen by Dr. Ninfa Linden on 01/22/2021.  She has known osteoarthritis end-stage both knees.  She had injections on 01/22/2021.  She states over the last week that the left knee pains became worse.  She has had no new injuries.  She is tried heat and ice Tylenol.  Pain is worse with the knee bent. Review of Systems See HPI.  Objective: Vital Signs: Ht 5\' 3"  (1.6 m)    Wt (!) 312 lb 3.2 oz (141.6 kg)    LMP 05/25/2014    BMI 55.30 kg/m   Physical Exam General: Well-developed well-nourished female seated in wheelchair no acute distress. Ortho Exam Left knee she has significant patellofemoral crepitus and tenderness around the  peripatellar region.  No gross instability valgus varus stressing no abnormal warmth erythema.  She has full active extension of the left knee and flexion to approximately 90 degrees. Specialty Comments:  No specialty comments available.  Imaging: XR Tibia/Fibula Left  Result Date: 02/11/2021 Left tib-fib 2 views: No acute fractures.  Severe end-stage arthritis and patella near bone-on-bone medial compartment.  Otherwise no bony abnormalities noted.    PMFS History: Patient Active Problem List   Diagnosis Date Noted   Complex atypical endometrial hyperplasia 01/31/2019   Morbid obesity ([place]) 10/11/2018   Rheumatoid arthritis ([place]) 12/19/2016   OA (osteoarthritis) of knee 04/07/2016   Hypothyroidism 01/22/2015   Chronic low back pain 01/22/2015   Depression 01/22/2015   Allergic rhinitis 01/22/2015   Ureteral calculus 12/26/2012   Personal history of gastric bypass 11/12/2012   Hyperlipidemia    Past Medical History:  Diagnosis Date   Anemia    Chronic back pain    Depression    Dyspnea    with activity   Environmental allergies    Family history of adverse reaction to anesthesia    daughter- n/v       Fibroid    GERD (gastroesophageal reflux disease)    Hematuria    History of cystitis  INTERSTITIAL CYSTITIS--  NO ISSUES FOR YRS   History of gastroesophageal reflux (GERD)    DENIES ISSUES SINCE GASTRIC BY PASS   History of hypertension    NO ISSUE SINCE WT LOSS AFTER GASTRIC BYPASS   History of kidney stones    History of obstructive sleep apnea    USED CPAP--  NO ISSUE SINCE WT LOSS AFTER GASTRIC BYPASS   Hypertension    Pneumonia    Pre-diabetes    pt. denies   Rheumatoid arthritis ([place])    Right ureteral stone    Sleep apnea    no cpap    Thyroid disease    Trochanteric bursitis of both hips     Family History  Problem Relation Age of Onset   Hypertension Mother    Stroke Mother    Hypertension Brother    Heart disease Maternal Uncle    Diabetes  Maternal Grandmother    Cancer Maternal Grandmother        THROAT CA   Cancer Maternal Grandfather        THROAT CA    Past Surgical History:  Procedure Laterality Date   CARDIAC CATHETERIZATION  12-07-2006  [facility]   NORMAL CORONARIES ARTERIES/ NORMAL LVF/ MILD INCREASED END-DIASTOLIC PRESSURE   CESAREAN SECTION  1998   CHOLECYSTECTOMY  1980'S   CYSTOSCOPY WITH RETROGRADE PYELOGRAM, URETEROSCOPY AND STENT PLACEMENT Bilateral 12/26/2012   Procedure: cystoscopy with bilateral retrogrades, bilateral ureteroscopy ;  Surgeon: Bernestine Amass, MD;  Location[facility];  Service: Urology;  Laterality: Bilateral;   CYSTOSCOPY WITH STENT PLACEMENT Bilateral 12/26/2012   Procedure: CYSTOSCOPY WITH STENT PLACEMENT;  Surgeon: Bernestine Amass, MD;  Location: [facility];  Service: Urology;  Laterality: Bilateral;   CYSTOSCOPY/ HYDRODISTENTION  YRS AGO   DILATATION & CURETTAGE/HYSTEROSCOPY WITH MYOSURE N/A 11/25/2018   Procedure: [place];  Surgeon: Anastasio Auerbach, MD;  Location: [facility];  Service: Gynecology;  Laterality: N/A;   HERNIA REPAIR     HOLMIUM LASER APPLICATION Right 45/04/6466   Procedure: HOLMIUM LASER APPLICATION;  Surgeon: Bernestine Amass, MD;  Location: [facility];  Service: Urology;  Laterality: Right;   KNEE ARTHROSCOPY Left 1992   ROBOTIC ASSISTED TOTAL HYSTERECTOMY WITH BILATERAL SALPINGO OOPHERECTOMY N/A 01/31/2019   Procedure: XI ROBOTIC ASSISTED TOTAL HYSTERECTOMY WITH BILATERAL SALPINGO OOPHORECTOMY;  Surgeon: Everitt Amber, MD;  Location[facility];  Service: Gynecology;  Laterality: N/A;   ROUX-EN-Y PROCEDURE  07-25-2007   SHOULDER ARTHROSCOPY Right 2011   TMJ ARTHROPLASTY  1990'S   BILATERAL UPPER   Social History   Occupational History   Not on file  Tobacco Use   Smoking status: Never   Smokeless tobacco: Never  Vaping Use   Vaping Use: Never used  Substance and Sexual Activity    Alcohol use: No   Drug use: No   Sexual activity: Not Currently    Partners: Male    Birth control/protection: Surgical    Comment: husband vasectomy-1st intercourse 26 yo-Fewer than 5 partners

## 2021-02-12 NOTE — Addendum Note (Signed)
Addended by: Robyne Peers on: 02/12/2021 10:27 AM   Modules accepted: Orders

## 2021-03-05 ENCOUNTER — Other Ambulatory Visit: Payer: Self-pay | Admitting: Internal Medicine

## 2021-03-05 DIAGNOSIS — E559 Vitamin D deficiency, unspecified: Secondary | ICD-10-CM

## 2021-03-15 ENCOUNTER — Other Ambulatory Visit: Payer: Self-pay | Admitting: Internal Medicine

## 2021-03-15 DIAGNOSIS — R8290 Unspecified abnormal findings in urine: Secondary | ICD-10-CM

## 2021-03-29 ENCOUNTER — Other Ambulatory Visit: Payer: Self-pay | Admitting: Internal Medicine

## 2021-04-23 ENCOUNTER — Encounter: Payer: Self-pay | Admitting: Orthopaedic Surgery

## 2021-04-23 ENCOUNTER — Ambulatory Visit: Payer: No Typology Code available for payment source | Admitting: Orthopaedic Surgery

## 2021-04-23 VITALS — Ht 63.0 in | Wt 309.8 lb

## 2021-04-23 DIAGNOSIS — M25562 Pain in left knee: Secondary | ICD-10-CM | POA: Diagnosis not present

## 2021-04-23 DIAGNOSIS — G8929 Other chronic pain: Secondary | ICD-10-CM | POA: Diagnosis not present

## 2021-04-23 DIAGNOSIS — M25561 Pain in right knee: Secondary | ICD-10-CM | POA: Diagnosis not present

## 2021-04-23 MED ORDER — METHYLPREDNISOLONE ACETATE 40 MG/ML IJ SUSP
40.0000 mg | INTRAMUSCULAR | Status: AC | PRN
  Administered 2021-04-23: 40 mg via INTRA_ARTICULAR

## 2021-04-23 MED ORDER — LIDOCAINE HCL 1 % IJ SOLN
3.0000 mL | INTRAMUSCULAR | Status: AC | PRN
  Administered 2021-04-23: 3 mL

## 2021-04-23 NOTE — Progress Notes (Signed)
? ?Office Visit Note ?  ?Patient: Mary Herman           ?Date of Birth: 1960/12/09           ?MRN: [id number] ?Visit Date: 04/23/2021 ?             ?Requested by: Elby Showers, MD ?[address] ?[place],  [place] [id number] ?PCP: Elby Showers, MD ? ? ?Assessment & Plan: ?Visit Diagnoses:  ?1. Chronic pain of left knee   ?2. Chronic pain of right knee   ?3. Morbid (severe) obesity due to excess calories ([place])   ? ? ?Plan: Per the patient's request I did provide a steroid injection in both knees today which she tolerated well.  She knows to wait at least 3 to 4 months between steroid injections.  Of counseled her again about weight loss. ? ?Follow-Up Instructions: Return if symptoms worsen or fail to improve.  ? ?Orders:  ?Orders Placed This Encounter  ?Procedures  ? Large Joint Inj  ? Large Joint Inj  ? ?No orders of the defined types were placed in this encounter. ? ? ? ? Procedures: ?Large Joint Inj: R knee on 04/23/2021 9:09 AM ?Indications: diagnostic evaluation and pain ?Details: 22 G 1.5 in needle, superolateral approach ? ?Arthrogram: No ? ?Medications: 3 mL lidocaine 1 %; 40 mg methylPREDNISolone acetate 40 MG/ML ?Outcome: tolerated well, no immediate complications ?Procedure, treatment alternatives, risks and benefits explained, specific risks discussed. Consent was given by the patient. Immediately prior to procedure a time out was called to verify the correct patient, procedure, equipment, support staff and site/side marked as required. Patient was prepped and draped in the usual sterile fashion.  ? ? ?Large Joint Inj: L knee on 04/23/2021 9:09 AM ?Indications: diagnostic evaluation and pain ?Details: 22 G 1.5 in needle, superolateral approach ? ?Arthrogram: No ? ?Medications: 3 mL lidocaine 1 %; 40 mg methylPREDNISolone acetate 40 MG/ML ?Outcome: tolerated well, no immediate complications ?Procedure, treatment alternatives, risks and benefits explained, specific risks discussed. Consent was given  by the patient. Immediately prior to procedure a time out was called to verify the correct patient, procedure, equipment, support staff and site/side marked as required. Patient was prepped and draped in the usual sterile fashion.  ? ? ? ? ?Clinical Data: ?No additional findings. ? ? ?Subjective: ?Chief Complaint  ?Patient presents with  ? Left Knee - Follow-up  ? Right Knee - Pain  ?The patient is well-known to me.  She comes in requesting bilateral knee steroid injections due to severe end-stage arthritis of both her knees.  She is still not a surgical candidate given her BMI today is almost 55.  She has had no acute change in her medical status.  She is not a diabetic.  She has had steroid injections before.  It has been 3 months since her last injections. ? ?HPI ? ?Review of Systems ?There is no listed fever, chills, nausea, vomiting ? ?Objective: ?Vital Signs: Ht 5\' 3"  (1.6 m)   Wt (!) 309 lb 12.8 oz (140.5 kg)   LMP 05/25/2014   BMI 54.88 kg/m?  ? ?Physical Exam ?She is alert and orient x3 and in no acute distress ?Ortho Exam ?She does walk with a limp favoring both knees.  Both knees have varus malalignment but no significant effusion today.  Both knees have global tenderness.  We have x-rayed them before in the past showing severe arthritis. ?Specialty Comments:  ?No specialty comments available. ? ?Imaging: ?No results  found. ? ? ?PMFS History: ?Patient Active Problem List  ? Diagnosis Date Noted  ? Complex atypical endometrial hyperplasia 01/31/2019  ? Morbid obesity ([place]) 10/11/2018  ? Rheumatoid arthritis ([place]) 12/19/2016  ? OA (osteoarthritis) of knee 04/07/2016  ? Hypothyroidism 01/22/2015  ? Chronic low back pain 01/22/2015  ? Depression 01/22/2015  ? Allergic rhinitis 01/22/2015  ? Ureteral calculus 12/26/2012  ? Personal history of gastric bypass 11/12/2012  ? Hyperlipidemia   ? ?Past Medical History:  ?Diagnosis Date  ? Anemia   ? Chronic back pain   ? Depression   ? Dyspnea   ? with activity  ?  Environmental allergies   ? Family history of adverse reaction to anesthesia   ? daughter- n/v      ? Fibroid   ? GERD (gastroesophageal reflux disease)   ? Hematuria   ? History of cystitis   ? INTERSTITIAL CYSTITIS--  NO ISSUES FOR YRS  ? History of gastroesophageal reflux (GERD)   ? DENIES ISSUES SINCE GASTRIC BY PASS  ? History of hypertension   ? NO ISSUE SINCE WT LOSS AFTER GASTRIC BYPASS  ? History of kidney stones   ? History of obstructive sleep apnea   ? USED CPAP--  NO ISSUE SINCE WT LOSS AFTER GASTRIC BYPASS  ? Hypertension   ? Pneumonia   ? Pre-diabetes   ? pt. denies  ? Rheumatoid arthritis ([place])   ? Right ureteral stone   ? Sleep apnea   ? no cpap   ? Thyroid disease   ? Trochanteric bursitis of both hips   ?  ?Family History  ?Problem Relation Age of Onset  ? Hypertension Mother   ? Stroke Mother   ? Hypertension Brother   ? Heart disease Maternal Uncle   ? Diabetes Maternal Grandmother   ? Cancer Maternal Grandmother   ?     THROAT CA  ? Cancer Maternal Grandfather   ?     THROAT CA  ?  ?Past Surgical History:  ?Procedure Laterality Date  ? CARDIAC CATHETERIZATION  12-07-2006  DR [facility]  ? NORMAL CORONARIES ARTERIES/ NORMAL LVF/ MILD INCREASED END-DIASTOLIC PRESSURE  ? [place]  ? CHOLECYSTECTOMY  1980'S  ? CYSTOSCOPY WITH RETROGRADE PYELOGRAM, URETEROSCOPY AND STENT PLACEMENT Bilateral 12/26/2012  ? Procedure: cystoscopy with bilateral retrogrades, bilateral ureteroscopy ;  Surgeon: Bernestine Amass, MD;  Location: [facility];  Service: Urology;  Laterality: Bilateral;  ? CYSTOSCOPY WITH STENT PLACEMENT Bilateral 12/26/2012  ? Procedure: CYSTOSCOPY WITH STENT PLACEMENT;  Surgeon: Bernestine Amass, MD;  Location: [facility];  Service: Urology;  Laterality: Bilateral;  ? CYSTOSCOPY/ HYDRODISTENTION  YRS AGO  ? DILATATION & CURETTAGE/HYSTEROSCOPY WITH MYOSURE N/A 11/25/2018  ? Procedure: [place];  Surgeon: Anastasio Auerbach, MD;  Location: [facility];  Service: Gynecology;  Laterality: N/A;  ? HERNIA REPAIR    ? HOLMIUM LASER APPLICATION Right 40/10/7351  ? Procedure: HOLMIUM LASER APPLICATION;  Surgeon: Bernestine Amass, MD;  Location: [facility];  Service: Urology;  Laterality: Right;  ? KNEE ARTHROSCOPY Left 1992  ? ROBOTIC ASSISTED TOTAL HYSTERECTOMY WITH BILATERAL SALPINGO OOPHERECTOMY N/A 01/31/2019  ? Procedure: XI ROBOTIC ASSISTED TOTAL HYSTERECTOMY WITH BILATERAL SALPINGO OOPHORECTOMY;  Surgeon: Everitt Amber, MD;  Location: [facility];  Service: Gynecology;  Laterality: N/A;  ? ROUX-EN-Y PROCEDURE  07-25-2007  ? SHOULDER ARTHROSCOPY Right 2011  ? TMJ ARTHROPLASTY  1990'S  ? BILATERAL UPPER  ? ?  Social History  ? ?Occupational History  ? Not on file  ?Tobacco Use  ? Smoking status: Never  ? Smokeless tobacco: Never  ?Vaping Use  ? Vaping Use: Never used  ?Substance and Sexual Activity  ? Alcohol use: No  ? Drug use: No  ? Sexual activity: Not Currently  ?  Partners: Male  ?  Birth control/protection: Surgical  ?  Comment: husband vasectomy-1st intercourse 58 yo-Fewer than 5 partners  ? ? ? ? ? ? ?

## 2021-05-13 ENCOUNTER — Other Ambulatory Visit: Payer: No Typology Code available for payment source

## 2021-05-13 ENCOUNTER — Encounter: Payer: Self-pay | Admitting: Internal Medicine

## 2021-05-13 ENCOUNTER — Other Ambulatory Visit: Payer: Self-pay

## 2021-05-13 ENCOUNTER — Ambulatory Visit (INDEPENDENT_AMBULATORY_CARE_PROVIDER_SITE_OTHER): Payer: No Typology Code available for payment source | Admitting: Internal Medicine

## 2021-05-13 VITALS — BP 128/88 | HR 90 | Temp 97.9°F | Ht 63.0 in | Wt 303.5 lb

## 2021-05-13 DIAGNOSIS — E039 Hypothyroidism, unspecified: Secondary | ICD-10-CM

## 2021-05-13 DIAGNOSIS — Z0000 Encounter for general adult medical examination without abnormal findings: Secondary | ICD-10-CM

## 2021-05-13 DIAGNOSIS — Z1213 Encounter for screening for malignant neoplasm of small intestine: Secondary | ICD-10-CM

## 2021-05-13 DIAGNOSIS — E559 Vitamin D deficiency, unspecified: Secondary | ICD-10-CM | POA: Diagnosis not present

## 2021-05-13 DIAGNOSIS — Z23 Encounter for immunization: Secondary | ICD-10-CM

## 2021-05-13 DIAGNOSIS — R7302 Impaired glucose tolerance (oral): Secondary | ICD-10-CM

## 2021-05-13 DIAGNOSIS — E1169 Type 2 diabetes mellitus with other specified complication: Secondary | ICD-10-CM | POA: Diagnosis not present

## 2021-05-13 DIAGNOSIS — Z1211 Encounter for screening for malignant neoplasm of colon: Secondary | ICD-10-CM

## 2021-05-13 DIAGNOSIS — M170 Bilateral primary osteoarthritis of knee: Secondary | ICD-10-CM

## 2021-05-13 DIAGNOSIS — F419 Anxiety disorder, unspecified: Secondary | ICD-10-CM

## 2021-05-13 DIAGNOSIS — Z9884 Bariatric surgery status: Secondary | ICD-10-CM

## 2021-05-13 DIAGNOSIS — E785 Hyperlipidemia, unspecified: Secondary | ICD-10-CM

## 2021-05-13 DIAGNOSIS — M059 Rheumatoid arthritis with rheumatoid factor, unspecified: Secondary | ICD-10-CM

## 2021-05-13 DIAGNOSIS — E119 Type 2 diabetes mellitus without complications: Secondary | ICD-10-CM

## 2021-05-13 DIAGNOSIS — F32A Depression, unspecified: Secondary | ICD-10-CM

## 2021-05-13 DIAGNOSIS — M160 Bilateral primary osteoarthritis of hip: Secondary | ICD-10-CM

## 2021-05-13 LAB — POCT URINALYSIS DIPSTICK
Bilirubin, UA: NEGATIVE
Blood, UA: NEGATIVE
Glucose, UA: NEGATIVE
Ketones, UA: NEGATIVE
Leukocytes, UA: NEGATIVE
Nitrite, UA: NEGATIVE
Protein, UA: NEGATIVE
Spec Grav, UA: 1.015 (ref 1.010–1.025)
Urobilinogen, UA: 0.2 E.U./dL
pH, UA: 6.5 (ref 5.0–8.0)

## 2021-05-13 MED ORDER — SITAGLIPTIN PHOSPHATE 50 MG PO TABS: 50.0000 mg | ORAL_TABLET | Freq: Every day | ORAL | 2 refills | Status: DC

## 2021-05-13 MED ORDER — AZITHROMYCIN 250 MG PO TABS: ORAL_TABLET | ORAL | 0 refills | Status: AC

## 2021-05-13 MED ORDER — FLUCONAZOLE 150 MG PO TABS: 150.0000 mg | ORAL_TABLET | Freq: Once | ORAL | 0 refills | Status: AC

## 2021-05-13 NOTE — Progress Notes (Signed)
Lab only 

## 2021-05-13 NOTE — Progress Notes (Signed)
? ?Subjective:  ? ? Patient ID: Mary Herman, female    DOB: [birth year], 61 y.o.   MRN: [id number] ? ?HPI 61 year old Female seen for health maintenance exam and evaluation of medical issues including rheumatoid arthritis, hypothyroidism, depression, allergic rhinitis, chronic back pain, morbid obesity and history of Vitamin D deficiency. ? ?History of cholecystectomy.  Had C-section 1998.  Had arthroscopic surgery of her left knee over 25 years ago.  Had surgery for complex atypical endometrial hyperplasia including total abdominal hysterectomy BSO with no cancer in December 2020. ? ?Has seen Dr. Ninfa Linden regarding chronic pain of left knee and has had steroid injections of both knees in February 2022.  Has been diagnosed with primary osteoarthritis of both knees. ? ?Codeine causes a rash. ? ?She is allergic to cats.  Has cats in her home. ? ?History of rheumatoid arthritis treated by [facility].  She has been prescribed DMARD. ? ?She continues to work from home for [employer].  She sits a lot.  Not very active.  Not interested in gastric bypass surgery.  We tried getting her some assistance with dietary counseling through [facility] but apparently her insurance does not participate with [facility].  We will get her a dietary referral.  She prefers something not during work hours.  I am not sure if virtual dietary counseling is available at [facility]. ? ?Her fasting glucose is 130 and was 132 last year.  TSH is normal.  BUN and creatinine are normal.  Lipid panel is normal. ? ?Had gastric bypass surgery in 2009.  Have offered Dr. Migdalia Dk clinic as an option for weight loss but she wants something during evening hours and not during work hours.  She had gastric bypass surgery in 2009 by Dr. Hassell Done.  I offered to refer her back to Dr. Hassell Done regarding gastric bypass surgery to see if there is an additional option.  She does not seem interested. ? ?Social history: She is married.  She works from the  building on her property near her home for [employer].  Husband is disabled.  She has a high school education.  Does not smoke or consume alcohol.  She has 1 daughter. ? ?Family history: Father died at age 25 with history of alcoholism but cause of death not known to patient.  Mother with history of brain aneurysm occurring 10 years prior to her dad.  Mother died at age 16 with stroke and had lung disease.  1 brother with history of hypertension.  Another brother who is health status is not known to patient.  No sisters. ? ?Review of Systems has fatigue and arthralgias.  Seems a bit depressed. ? ?Fasting serum glucose is 130, vitamin D level is 98.    She is taking high-dose vitamin D weekly.  In 2021 her vitamin D level was 19.  Hemoglobin A1c is 5.9%. ?I am placing her on Januvia 50 mg daily.  She will follow-up in 6 weeks. ? ?TSH is 0.57 on thyroid replacement medication.   ?   ?Objective:  ? Physical Exam ?Blood pressure 128/88 pulse 90 temperature 97.9 degrees pulse oximetry 97% weight is 303 pounds 8 ounces BMI 53.76.  She has lost 12 pounds since last year. ?Skin: Warm and dry.  No cervical adenopathy.  No thyromegaly and no carotid bruits.  TMs are clear.  Neck is supple.  Chest is clear to auscultation without rales or wheezing.  Breast are pendulous without masses.  Cardiac exam: Regular rate and  rhythm without murmurs.  Abdomen: Obese, soft, nondistended.  No appreciable hepatosplenomegaly masses or tenderness.  No lower extremity pitting edema.  Affect, thought, and judgment are normal.  On brief neurological exam there are no gross focal deficits.  No erythematous or swollen joints noted today. ? ? ?   ?Assessment & Plan:  ?History of rheumatoid arthritis on DMARD by [facility] ? ?Primary osteoarthritis of both knees treated by Dr. Ninfa Linden with steroid injections ? ?Morbid obesity with history of gastric bypass surgery but she has lost 12 pounds since last year.  I would like  dietitian to see her. ? ?Anxiety and depression treated with Wellbutrin and Celexa ? ?Chronic musculoskeletal pain ? ?Hypothyroidism stable on thyroid replacement medication ? ?History of vitamin D deficiency-continue weekly vitamin D supplement ? ?Allergic rhinitis treated with Singulair ? ?Musculoskeletal pain treated with Celebrex and Flexeril. ? ?Insomnia and anxiety treated with Valium 5 mg at bedtime if needed. ? ?Plan: She will follow-up in 6 weeks regarding diabetic management, weight loss and hemoglobin A1c.  Mammogram is due.  Colonoscopy discussed.  She was given pneumococcal 20 vaccine today.  Tetanus immunization due in 2024.  She has had 4 COVID vaccines.  Got flu vaccine in September 2022. ?

## 2021-05-13 NOTE — Patient Instructions (Addendum)
Start Januvia 50 mg daily with referral to Endocrinology. Pneumococcal 20 given. Colonoscopy discussed. ?

## 2021-05-13 NOTE — Progress Notes (Deleted)
? ?  Subjective:  ? ? Patient ID: Mary Herman, female    DOB: 1960/11/03, 61 y.o.   MRN: [id number] ? ?HPI 61 year old Female seen for health maintenance exam and evaluation of medical issues. Does not know how many calories eating daily. ? ? ? ?Review of Systems ? ?   ?Objective:  ? Physical Exam ? ? ? ? ?   ?Assessment & Plan:  ?Health maintenance. Colonoscopy discussed. Patient not ready yet. Pneumonia 20 vaccine given. ? ? ?DM- Referral to Endocrine. See if [facility] can reach out to her also. ? ?

## 2021-05-14 LAB — COMPLETE METABOLIC PANEL WITH GFR
AG Ratio: 1.5 (calc) (ref 1.0–2.5)
ALT: 18 U/L (ref 6–29)
AST: 19 U/L (ref 10–35)
Albumin: 3.9 g/dL (ref 3.6–5.1)
Alkaline phosphatase (APISO): 115 U/L (ref 37–153)
BUN: 13 mg/dL (ref 7–25)
CO2: 28 mmol/L (ref 20–32)
Calcium: 9.1 mg/dL (ref 8.6–10.4)
Chloride: 105 mmol/L (ref 98–110)
Creat: 0.91 mg/dL (ref 0.50–1.03)
Globulin: 2.6 g/dL (calc) (ref 1.9–3.7)
Glucose, Bld: 130 mg/dL — ABNORMAL HIGH (ref 65–99)
Potassium: 5.1 mmol/L (ref 3.5–5.3)
Sodium: 144 mmol/L (ref 135–146)
Total Bilirubin: 1.2 mg/dL (ref 0.2–1.2)
Total Protein: 6.5 g/dL (ref 6.1–8.1)
eGFR: 73 mL/min/{1.73_m2} (ref >=60)

## 2021-05-14 LAB — CBC WITH DIFFERENTIAL/PLATELET
Absolute Monocytes: 371 cells/uL (ref 200–950)
Basophils Absolute: 12 cells/uL (ref 0–200)
Basophils Relative: 0.3 %
Eosinophils Absolute: 140 cells/uL (ref 15–500)
Eosinophils Relative: 3.6 %
HCT: 45.2 % — ABNORMAL HIGH (ref 35.0–45.0)
Hemoglobin: 14.6 g/dL (ref 11.7–15.5)
Lymphs Abs: 1053 cells/uL (ref 850–3900)
MCH: 29.1 pg (ref 27.0–33.0)
MCHC: 32.3 g/dL (ref 32.0–36.0)
MCV: 90.2 fL (ref 80.0–100.0)
MPV: 11.4 fL (ref 7.5–12.5)
Monocytes Relative: 9.5 %
Neutro Abs: 2324 cells/uL (ref 1500–7800)
Neutrophils Relative %: 59.6 %
Platelets: 216 10*3/uL (ref 140–400)
RBC: 5.01 10*6/uL (ref 3.80–5.10)
RDW: 12.2 % (ref 11.0–15.0)
Total Lymphocyte: 27 %
WBC: 3.9 10*3/uL (ref 3.8–10.8)

## 2021-05-14 LAB — LIPID PANEL
Cholesterol: 170 mg/dL (ref <200)
HDL: 88 mg/dL (ref >=50)
LDL Cholesterol (Calc): 63 mg/dL (calc)
Non-HDL Cholesterol (Calc): 82 mg/dL (calc) (ref <130)
Total CHOL/HDL Ratio: 1.9 (calc) (ref <5.0)
Triglycerides: 102 mg/dL (ref <150)

## 2021-05-14 LAB — TSH: TSH: 0.57 mIU/L (ref 0.40–4.50)

## 2021-05-14 LAB — HEMOGLOBIN A1C
Hgb A1c MFr Bld: 5.9 % of total Hgb — ABNORMAL HIGH (ref <5.7)
Mean Plasma Glucose: 123 mg/dL
eAG (mmol/L): 6.8 mmol/L

## 2021-05-14 LAB — VITAMIN D 25 HYDROXY (VIT D DEFICIENCY, FRACTURES): Vit D, 25-Hydroxy: 98 ng/mL (ref 30–100)

## 2021-05-16 NOTE — Progress Notes (Signed)
Referral request for dietician sent to [facility]. Patient has appt in 6 weeks and medication at pharmacy.  ?

## 2021-05-18 ENCOUNTER — Other Ambulatory Visit: Payer: Self-pay | Admitting: Internal Medicine

## 2021-06-04 ENCOUNTER — Other Ambulatory Visit: Payer: Self-pay | Admitting: Internal Medicine

## 2021-06-23 DIAGNOSIS — R7303 Prediabetes: Secondary | ICD-10-CM

## 2021-06-23 HISTORY — DX: Prediabetes: R73.03

## 2021-07-07 ENCOUNTER — Encounter: Payer: Self-pay | Admitting: Internal Medicine

## 2021-07-07 ENCOUNTER — Ambulatory Visit: Payer: No Typology Code available for payment source | Admitting: Internal Medicine

## 2021-07-07 VITALS — BP 124/86 | HR 107 | Temp 97.8°F | Ht 63.0 in | Wt 295.8 lb

## 2021-07-07 DIAGNOSIS — M059 Rheumatoid arthritis with rheumatoid factor, unspecified: Secondary | ICD-10-CM

## 2021-07-07 DIAGNOSIS — E039 Hypothyroidism, unspecified: Secondary | ICD-10-CM | POA: Diagnosis not present

## 2021-07-07 DIAGNOSIS — Z6841 Body Mass Index (BMI) 40.0 and over, adult: Secondary | ICD-10-CM

## 2021-07-07 DIAGNOSIS — E1165 Type 2 diabetes mellitus with hyperglycemia: Secondary | ICD-10-CM

## 2021-07-07 DIAGNOSIS — Z9884 Bariatric surgery status: Secondary | ICD-10-CM

## 2021-07-07 NOTE — Patient Instructions (Addendum)
We will see if continuous glucose monitor is an option. Recheck Hgb AIC today.  Consult regarding gastric bypass surgery done in 2009 by Dr. Hassell Done.Is there a newer procedure?   Consider increasing Januvia to 100 mg daily. Has had diabetic eye exam at [facility] on [address]- request report for our records.  Follow-up September 2023. ?

## 2021-07-07 NOTE — Progress Notes (Signed)
? ?  Subjective:  ? ? Patient ID: Mary Herman, female    DOB: 07-19-1960, 61 y.o.   MRN: [id number] ? ?HPI 61 year old Female here for OV and follow up on Type 2 Diabetes mellitus.was seen May 13, 2021 for health maintenance exam and evaluation of medical issues.  At that time was started on and is currently on Januvia 50 mg daily. Hgb AIC pending today to see if Januvia has helped. Had eye exam at [facility] on [address].  We will request report. ? ?She had gastric bypass surgery by Dr. Hassell Done in 2009.  She says she is unable to exercise due to arthritis of her knees.  Has sedentary job with [employer] and works from home.  Referral back to [facility] to see if another procedure is more appropriate.  She is agreeable to this today. ? ?Her social and family history please see dictation 05/13/2021. ? ?I recommended dietary counseling. ? ?She had arthroscopic surgery of her left knee over 25 years ago.  History of cholecystectomy.  C-section 1998.  Surgery for complex atypical endometrial hyperplasia including total abdominal hysterectomy BSO with no cancer being found in December 2020. ? ?Sees Dr. Ninfa Linden regarding chronic left knee pain and has had steroid injections of both knees.  Diagnosed with primary osteoarthritis of both knees. ? ?History of rheumatoid arthritis treated by [facility] rheumatology.  Has been prescribed DMARD medication. ? ?Fasting glucose in March was 130. ? ? ? ?Review of Systems ?Not checking accuchecks- does not have meter.  We will see if continuous glucose monitoring is an option ?   ?Objective:  ? Physical Exam ?Blood pressure 124/86 pulse 107 regular pulse oximetry 97% weight 295 pounds BMI 52.39. ? ?In March she weighed 303 pounds 8 ounces with a BMI of 53.76.  This represents some improvement with an 8 pound weight loss. ?  ?Continues with chronic knee pain. ? ?   ?Assessment & Plan:  ?BMI 52.39-has lost 8 pounds since last visit which is good ? ?Type  2 diabetes mellitus-currently on Januvia 50 mg daily.  Either needs home glucose monitor or continuous glucose monitoring.  Not sure if her insurance will cover continuous glucose monitoring.  Hemoglobin A1c today on Januvia 50 mg daily is pending. ? ?Referral back to [facility] to see if additional surgery is warranted or revision of current surgery.  Limitations with exercise due to rheumatoid arthritis and chronic knee osteoarthritis. ? ?History of rheumatoid arthritis treated by [facility] rheumatology.  History of rheumatoid arthritis involving multiple sites with positive rheumatoid factor.  Has been on Enbrel.  Takes Celebrex and Tramadol. ? ?

## 2021-07-08 LAB — HEMOGLOBIN A1C
Hgb A1c MFr Bld: 5.9 % of total Hgb — ABNORMAL HIGH (ref <5.7)
Mean Plasma Glucose: 123 mg/dL
eAG (mmol/L): 6.8 mmol/L

## 2021-07-08 LAB — MICROALBUMIN, URINE: Microalb, Ur: 11.8 mg/dL

## 2021-07-20 ENCOUNTER — Other Ambulatory Visit: Payer: Self-pay | Admitting: Internal Medicine

## 2021-07-28 ENCOUNTER — Ambulatory Visit: Payer: No Typology Code available for payment source | Admitting: Orthopaedic Surgery

## 2021-07-31 ENCOUNTER — Other Ambulatory Visit: Payer: Self-pay | Admitting: Internal Medicine

## 2021-08-06 ENCOUNTER — Other Ambulatory Visit: Payer: Self-pay | Admitting: Orthopaedic Surgery

## 2021-08-06 ENCOUNTER — Ambulatory Visit: Payer: No Typology Code available for payment source | Admitting: Orthopaedic Surgery

## 2021-08-06 ENCOUNTER — Telehealth: Payer: Self-pay | Admitting: Physician Assistant

## 2021-08-06 ENCOUNTER — Encounter: Payer: Self-pay | Admitting: Orthopaedic Surgery

## 2021-08-06 ENCOUNTER — Telehealth: Payer: Self-pay

## 2021-08-06 VITALS — Wt 297.0 lb

## 2021-08-06 DIAGNOSIS — M1712 Unilateral primary osteoarthritis, left knee: Secondary | ICD-10-CM

## 2021-08-06 DIAGNOSIS — M1711 Unilateral primary osteoarthritis, right knee: Secondary | ICD-10-CM

## 2021-08-06 MED ORDER — HYDROCODONE-ACETAMINOPHEN 5-325 MG PO TABS: 1.0000 | ORAL_TABLET | Freq: Four times a day (QID) | ORAL | 0 refills | Status: DC | PRN

## 2021-08-06 MED ORDER — METHYLPREDNISOLONE ACETATE 40 MG/ML IJ SUSP
40.0000 mg | INTRAMUSCULAR | Status: AC | PRN
  Administered 2021-08-06: 40 mg via INTRA_ARTICULAR

## 2021-08-06 MED ORDER — LIDOCAINE HCL 1 % IJ SOLN
3.0000 mL | INTRAMUSCULAR | Status: AC | PRN
  Administered 2021-08-06: 3 mL

## 2021-08-06 NOTE — Progress Notes (Signed)
Office Visit Note   Patient: Mary Herman           Date of Birth: October 30, 2000           MRN: [id number] Visit Date: 08/06/2021              Requested by: Elby Showers, MD [address] [place],  [place] [id number] PCP: Elby Showers, MD   Assessment & Plan: Visit Diagnoses:  1. Unilateral primary osteoarthritis, left knee   2. Unilateral primary osteoarthritis, right knee     Plan: We will again refer to pain management for her knee pain.  She will take the Norco that she is given today sparingly.  She will follow-up with [place] on an as-needed basis.  She knows to wait least 3 months between injections.  Questions encouraged and answered at length.  Follow-Up Instructions: Return if symptoms worsen or fail to improve.   Orders:  Orders Placed This Encounter  Procedures   Large Joint Inj   Meds ordered this encounter  Medications   HYDROcodone-acetaminophen (NORCO/VICODIN) 5-325 MG tablet    Sig: Take 1 tablet by mouth every 6 (six) hours as needed for moderate pain.    Dispense:  30 tablet    Refill:  0      Procedures: Large Joint Inj: bilateral knee on 08/06/2021 10:08 AM Indications: pain Details: 22 G 1.5 in needle, lateral approach  Arthrogram: No  Medications (Right): 3 mL lidocaine 1 %; 40 mg methylPREDNISolone acetate 40 MG/ML Medications (Left): 3 mL lidocaine 1 %; 40 mg methylPREDNISolone acetate 40 MG/ML Outcome: tolerated well, no immediate complications Procedure, treatment alternatives, risks and benefits explained, specific risks discussed. Consent was given by the patient. Immediately prior to procedure a time out was called to verify the correct patient, procedure, equipment, support staff and site/side marked as required. Patient was prepped and draped in the usual sterile fashion.       Clinical Data: No additional findings.   Subjective: Chief Complaint  Patient presents with   Left Knee - Follow-up   Right Knee - Follow-up     HPI [name] comes in today for follow-up bilateral knees.  She was last seen on 04/24/2018 and was given cortisone injections both knees.  She states the injections helped some for least a few months.  She states the pain is not back to what it was prior to her first set of injections.  She is diabetic at this point.  However last hemoglobin A1c was 5.9.  She is trying to work on weight loss.  She states she is due to get back to see Dr. Hassell Done who performed bariatric surgery on her in the past.  Unfortunately no one called her from the weight loss clinic pain management clinic.  She is asking for refill on Norco which she had in December.  She notes no injury to either knee.  Review of Systems  Constitutional:  Negative for chills and fever.     Objective: Vital Signs: Wt 297 lb (134.7 kg)   LMP 05/25/2014   BMI 52.61 kg/m   Physical Exam General: Well-developed well-nourished female no acute distress mood affect appropriate Ortho Exam Bilateral knees she walks with a slight antalgic gait without any assistive device.  Varus malalignment both knees.  No abnormal warmth erythema or effusion.  Global tenderness both knees.  Tello femoral crepitus with passive range of motion both knees. Specialty Comments:  No specialty comments available.  Imaging: No  results found.   PMFS History: Patient Active Problem List   Diagnosis Date Noted   Complex atypical endometrial hyperplasia 01/31/2019   Morbid obesity ([place]) 10/11/2018   Rheumatoid arthritis ([place]) 12/19/2016   OA (osteoarthritis) of knee 04/07/2016   Hypothyroidism 01/22/2015   Chronic low back pain 01/22/2015   Depression 01/22/2015   Allergic rhinitis 01/22/2015   Ureteral calculus 12/26/2012   Personal history of gastric bypass 11/12/2012   Hyperlipidemia    Past Medical History:  Diagnosis Date   Anemia    Chronic back pain    Depression    Dyspnea    with activity   Environmental allergies    Family  history of adverse reaction to anesthesia    daughter- n/v       Fibroid    GERD (gastroesophageal reflux disease)    Hematuria    History of cystitis    INTERSTITIAL CYSTITIS--  NO ISSUES FOR YRS   History of gastroesophageal reflux (GERD)    DENIES ISSUES SINCE GASTRIC BY PASS   History of hypertension    NO ISSUE SINCE WT LOSS AFTER GASTRIC BYPASS   History of kidney stones    History of obstructive sleep apnea    USED CPAP--  NO ISSUE SINCE WT LOSS AFTER GASTRIC BYPASS   Hypertension    Pneumonia    Pre-diabetes    pt. denies   Rheumatoid arthritis ([place])    Right ureteral stone    Sleep apnea    no cpap    Thyroid disease    Trochanteric bursitis of both hips     Family History  Problem Relation Age of Onset   Hypertension Mother    Stroke Mother    Hypertension Brother    Heart disease Maternal Uncle    Diabetes Maternal Grandmother    Cancer Maternal Grandmother        THROAT CA   Cancer Maternal Grandfather        THROAT CA    Past Surgical History:  Procedure Laterality Date   CARDIAC CATHETERIZATION  12-07-2006  [facility]   NORMAL CORONARIES ARTERIES/ NORMAL LVF/ MILD INCREASED END-DIASTOLIC PRESSURE   CESAREAN SECTION  1998   CHOLECYSTECTOMY  1980'S   CYSTOSCOPY WITH RETROGRADE PYELOGRAM, URETEROSCOPY AND STENT PLACEMENT Bilateral 12/26/2012   Procedure: cystoscopy with bilateral retrogrades, bilateral ureteroscopy ;  Surgeon: Bernestine Amass, MD;  Location: [facility];  Service: Urology;  Laterality: Bilateral;   CYSTOSCOPY WITH STENT PLACEMENT Bilateral 12/26/2012   Procedure: CYSTOSCOPY WITH STENT PLACEMENT;  Surgeon: Bernestine Amass, MD;  Location: [facility];  Service: Urology;  Laterality: Bilateral;   CYSTOSCOPY/ HYDRODISTENTION  YRS AGO   DILATATION & CURETTAGE/HYSTEROSCOPY WITH MYOSURE N/A 11/25/2018   Procedure: DILATATION & CURETTAGE/HYSTEROSCOPY WITH MYOSURE;  Surgeon: Anastasio Auerbach, MD;  Location: [facility];   Service: Gynecology;  Laterality: N/A;   HERNIA REPAIR     HOLMIUM LASER APPLICATION Right 63/87/5643   Procedure: HOLMIUM LASER APPLICATION;  Surgeon: Bernestine Amass, MD;  Location: [facility];  Service: Urology;  Laterality: Right;   KNEE ARTHROSCOPY Left 1992   ROBOTIC ASSISTED TOTAL HYSTERECTOMY WITH BILATERAL SALPINGO OOPHERECTOMY N/A 01/31/2019   Procedure: XI ROBOTIC ASSISTED TOTAL HYSTERECTOMY WITH BILATERAL SALPINGO OOPHORECTOMY;  Surgeon: Everitt Amber, MD;  Location: [facility];  Service: Gynecology;  Laterality: N/A;   ROUX-EN-Y PROCEDURE  07/25/2007   SHOULDER ARTHROSCOPY Right 2011   TMJ ARTHROPLASTY  1990'S   BILATERAL UPPER  TOTAL ABDOMINAL HYSTERECTOMY     Social History   Occupational History   Not on file  Tobacco Use   Smoking status: Never   Smokeless tobacco: Never  Vaping Use   Vaping Use: Never used  Substance and Sexual Activity   Alcohol use: No   Drug use: No   Sexual activity: Not Currently    Partners: Male    Birth control/protection: Surgical    Comment: husband vasectomy-1st intercourse 8 yo-Fewer than 5 partners

## 2021-08-06 NOTE — Telephone Encounter (Signed)
Mary Herman wanted me to check on pain mgmt referral months ago. It looks like it was denied, can we send her somewhere else?

## 2021-08-06 NOTE — Telephone Encounter (Signed)
Patient called. Her pharmacy does not have hydrocodone. Would like it called in to [facility] in [place]. Their number is [phone]

## 2021-08-07 ENCOUNTER — Other Ambulatory Visit: Payer: Self-pay

## 2021-08-07 DIAGNOSIS — M1712 Unilateral primary osteoarthritis, left knee: Secondary | ICD-10-CM

## 2021-08-07 DIAGNOSIS — M1711 Unilateral primary osteoarthritis, right knee: Secondary | ICD-10-CM

## 2021-08-13 DIAGNOSIS — M2550 Pain in unspecified joint: Secondary | ICD-10-CM | POA: Insufficient documentation

## 2021-08-13 DIAGNOSIS — G9332 Myalgic encephalomyelitis/chronic fatigue syndrome: Secondary | ICD-10-CM | POA: Insufficient documentation

## 2021-09-11 ENCOUNTER — Other Ambulatory Visit: Payer: Self-pay | Admitting: Internal Medicine

## 2021-09-11 ENCOUNTER — Telehealth: Payer: No Typology Code available for payment source | Admitting: Family Medicine

## 2021-09-11 DIAGNOSIS — J069 Acute upper respiratory infection, unspecified: Secondary | ICD-10-CM

## 2021-09-11 MED ORDER — BENZONATATE 100 MG PO CAPS: 100.0000 mg | ORAL_CAPSULE | Freq: Two times a day (BID) | ORAL | 0 refills | Status: DC | PRN

## 2021-09-11 MED ORDER — IPRATROPIUM BROMIDE 0.03 % NA SOLN: 2.0000 | Freq: Two times a day (BID) | NASAL | 0 refills | Status: DC

## 2021-09-11 MED ORDER — FLUTICASONE PROPIONATE 50 MCG/ACT NA SUSP: 2.0000 | Freq: Every day | NASAL | 0 refills | Status: DC

## 2021-09-11 NOTE — Addendum Note (Signed)
Addended by: Perlie Mayo on: 09/11/2021 10:15 AM   Modules accepted: Orders

## 2021-09-11 NOTE — Progress Notes (Signed)
E-Visit for Upper Respiratory Infection   We are sorry you are not feeling well.  Here is how we plan to help!  Based on what you have shared with me, it looks like you may have a viral upper respiratory infection.  Upper respiratory infections are caused by a large number of viruses; however, rhinovirus is the most common cause.   Symptoms vary from person to person, with common symptoms including sore throat, cough, fatigue or lack of energy and feeling of general discomfort.  A low-grade fever of up to 100.4 may present, but is often uncommon.  Symptoms vary however, and are closely related to a person's age or underlying illnesses.  The most common symptoms associated with an upper respiratory infection are nasal discharge or congestion, cough, sneezing, headache and pressure in the ears and face.  These symptoms usually persist for about 3 to 10 days, but can last up to 2 weeks.  It is important to know that upper respiratory infections do not cause serious illness or complications in most cases.    Upper respiratory infections can be transmitted from person to person, with the most common method of transmission being a person's hands.  The virus is able to live on the skin and can infect other persons for up to 2 hours after direct contact.  Also, these can be transmitted when someone coughs or sneezes; thus, it is important to cover the mouth to reduce this risk.  To keep the spread of the illness at [place], good hand hygiene is very important.  This is an infection that is most likely caused by a virus. There are no specific treatments other than to help you with the symptoms until the infection runs its course.  We are sorry you are not feeling well.  Here is how we plan to help!   For nasal congestion, you may use an oral decongestants such as Mucinex D or if you have glaucoma or high blood pressure use plain Mucinex.  Saline nasal spray or nasal drops can help and can safely be used as often as  needed for congestion.  For your congestion, I have prescribed Fluticasone nasal spray one spray in each nostril twice a day  If you do not have a history of heart disease, hypertension, diabetes or thyroid disease, prostate/bladder issues or glaucoma, you may also use Sudafed to treat nasal congestion.  It is highly recommended that you consult with a pharmacist or your primary care physician to ensure this medication is safe for you to take.     If you have a cough, you may use cough suppressants such as Delsym and Robitussin.  If you have glaucoma or high blood pressure, you can also use Coricidin HBP.   For cough- if you develop one, I have prescribed for you A prescription cough medication called Tessalon Perles 100 mg. You may take 1-2 capsules every 8 hours as needed for cough  If you have a sore or scratchy throat, use a saltwater gargle-  to  teaspoon of salt dissolved in a 4-ounce to 8-ounce glass of warm water.  Gargle the solution for approximately 15-30 seconds and then spit.  It is important not to swallow the solution.  You can also use throat lozenges/cough drops and Chloraseptic spray to help with throat pain or discomfort.  Warm or cold liquids can also be helpful in relieving throat pain.  For headache, pain or general discomfort, you can use Ibuprofen or Tylenol as directed.  Some authorities believe that zinc sprays or the use of Echinacea may shorten the course of your symptoms.   HOME CARE Only take medications as instructed by your medical team. Be sure to drink plenty of fluids. Water is fine as well as fruit juices, sodas and electrolyte beverages. You may want to stay away from caffeine or alcohol. If you are nauseated, try taking small sips of liquids. How do you know if you are getting enough fluid? Your urine should be a pale yellow or almost colorless. Get rest. Taking a steamy shower or using a humidifier may help nasal congestion and ease sore throat pain. You can  place a towel over your head and breathe in the steam from hot water coming from a faucet. Using a saline nasal spray works much the same way. Cough drops, hard candies and sore throat lozenges may ease your cough. Avoid close contacts especially the very young and the elderly Cover your mouth if you cough or sneeze Always remember to wash your hands.   GET HELP RIGHT AWAY IF: You develop worsening fever. If your symptoms do not improve within 10 days You develop yellow or green discharge from your nose over 3 days. You have coughing fits You develop a severe head ache or visual changes. You develop shortness of breath, difficulty breathing or start having chest pain Your symptoms persist after you have completed your treatment plan  MAKE SURE YOU  Understand these instructions. Will watch your condition. Will get help right away if you are not doing well or get worse.  Thank you for choosing an e-visit.  Your e-visit answers were reviewed by a board certified advanced clinical practitioner to complete your personal care plan. Depending upon the condition, your plan could have included both over the counter or prescription medications.  Please review your pharmacy choice. Make sure the pharmacy is open so you can pick up prescription now. If there is a problem, you may contact your provider through [employer] and have the prescription routed to another pharmacy.  Your safety is important to [place]. If you have drug allergies check your prescription carefully.   For the next 24 hours you can use MyChart to ask questions about today's visit, request a non-urgent call back, or ask for a work or school excuse. You will get an email in the next two days asking about your experience. I hope that your e-visit has been valuable and will speed your recovery.    I provided 5 minutes of non face-to-face time during this encounter for chart review, medication and order placement, as well as  and documentation.

## 2021-09-12 NOTE — Progress Notes (Deleted)
Cardiology Office Note:    Date:  09/12/2021   ID:  Mary Herman, DOB 1960/10/14, MRN [id number]  PCP:  Elby Showers, MD   [place] Providers Cardiologist:  None { Click to update primary MD,subspecialty MD or APP then REFRESH:1}    Referring MD: Elby Showers, MD   No chief complaint on file. ***  History of Present Illness:    Mary Herman is a 61 y.o. female with a hx of ***  Past Medical History:  Diagnosis Date   Anemia    Chronic back pain    Depression    Dyspnea    with activity   Environmental allergies    Family history of adverse reaction to anesthesia    daughter- n/v       Fibroid    GERD (gastroesophageal reflux disease)    Hematuria    History of cystitis    INTERSTITIAL CYSTITIS--  NO ISSUES FOR YRS   History of gastroesophageal reflux (GERD)    DENIES ISSUES SINCE GASTRIC BY PASS   History of hypertension    NO ISSUE SINCE WT LOSS AFTER GASTRIC BYPASS   History of kidney stones    History of obstructive sleep apnea    USED CPAP--  NO ISSUE SINCE WT LOSS AFTER GASTRIC BYPASS   Hypertension    Pneumonia    Pre-diabetes    pt. denies   Rheumatoid arthritis ([place])    Right ureteral stone    Sleep apnea    no cpap    Thyroid disease    Trochanteric bursitis of both hips     Past Surgical History:  Procedure Laterality Date   CARDIAC CATHETERIZATION  12-07-2006  DR [facility]   NORMAL CORONARIES ARTERIES/ NORMAL LVF/ MILD INCREASED END-DIASTOLIC PRESSURE   CESAREAN SECTION  1998   CHOLECYSTECTOMY  1980'S   CYSTOSCOPY WITH RETROGRADE PYELOGRAM, URETEROSCOPY AND STENT PLACEMENT Bilateral 12/26/2012   Procedure: cystoscopy with bilateral retrogrades, bilateral ureteroscopy ;  Surgeon: Bernestine Amass, MD;  Location: [facility];  Service: Urology;  Laterality: Bilateral;   CYSTOSCOPY WITH STENT PLACEMENT Bilateral 12/26/2012   Procedure: CYSTOSCOPY WITH STENT PLACEMENT;  Surgeon: Bernestine Amass, MD;  Location:  [facility];  Service: Urology;  Laterality: Bilateral;   CYSTOSCOPY/ HYDRODISTENTION  YRS AGO   DILATATION & CURETTAGE/HYSTEROSCOPY WITH MYOSURE N/A 11/25/2018   Procedure: DILATATION & CURETTAGE/HYSTEROSCOPY WITH MYOSURE;  Surgeon: Anastasio Auerbach, MD;  Location: [place];  Service: Gynecology;  Laterality: N/A;   HERNIA REPAIR     HOLMIUM LASER APPLICATION Right 34/19/3790   Procedure: HOLMIUM LASER APPLICATION;  Surgeon: Bernestine Amass, MD;  Location: [facility];  Service: Urology;  Laterality: Right;   KNEE ARTHROSCOPY Left 1992   ROBOTIC ASSISTED TOTAL HYSTERECTOMY WITH BILATERAL SALPINGO OOPHERECTOMY N/A 01/31/2019   Procedure: XI ROBOTIC ASSISTED TOTAL HYSTERECTOMY WITH BILATERAL SALPINGO OOPHORECTOMY;  Surgeon: Everitt Amber, MD;  Location: [facility];  Service: Gynecology;  Laterality: N/A;   ROUX-EN-Y PROCEDURE  07/25/2007   SHOULDER ARTHROSCOPY Right 2011   TMJ ARTHROPLASTY  1990'S   BILATERAL UPPER   TOTAL ABDOMINAL HYSTERECTOMY      Current Medications: No outpatient medications have been marked as taking for the 09/17/21 encounter (Appointment) with Freada Bergeron, MD.     Allergies:   Codeine and Methotrexate   Social History   Socioeconomic History   Marital status: Married    Spouse name: Not on file  Number of children: Not on file   Years of education: Not on file   Highest education level: Not on file  Occupational History   Not on file  Tobacco Use   Smoking status: Never   Smokeless tobacco: Never  Vaping Use   Vaping Use: Never used  Substance and Sexual Activity   Alcohol use: No   Drug use: No   Sexual activity: Not Currently    Partners: Male    Birth control/protection: Surgical    Comment: husband vasectomy-1st intercourse 26 yo-Fewer than 5 partners  Other Topics Concern   Not on file  Social History Narrative   Not on file   Social Determinants of Health   Financial Resource Strain: Not on file  Food  Insecurity: Not on file  Transportation Needs: Not on file  Physical Activity: Not on file  Stress: Not on file  Social Connections: Not on file     Family History: The patient's ***family history includes Cancer in her maternal grandfather and maternal grandmother; Diabetes in her maternal grandmother; Heart disease in her maternal uncle; Hypertension in her brother and mother; Stroke in her mother.  ROS:   Please see the history of present illness.    *** All other systems reviewed and are negative.  EKGs/Labs/Other Studies Reviewed:    The following studies were reviewed today: ***  EKG:  EKG is *** ordered today.  The ekg ordered today demonstrates ***  Recent Labs: 05/13/2021: ALT 18; BUN 13; Creat 0.91; Hemoglobin 14.6; Platelets 216; Potassium 5.1; Sodium 144; TSH 0.57  Recent Lipid Panel    Component Value Date/Time   CHOL 170 05/13/2021 1052   TRIG 102 05/13/2021 1052   HDL 88 05/13/2021 1052   CHOLHDL 1.9 05/13/2021 1052   VLDL 22 02/07/2013 1211   LDLCALC 63 05/13/2021 1052     Risk Assessment/Calculations:   {Does this patient have ATRIAL FIBRILLATION?:[phone]}       Physical Exam:    VS:  LMP 05/25/2014     Wt Readings from Last 3 Encounters:  08/06/21 297 lb (134.7 kg)  07/07/21 295 lb 12 oz (134.2 kg)  05/13/21 (!) 303 lb 8 oz (137.7 kg)     GEN: *** Well nourished, well developed in no acute distress HEENT: Normal NECK: No JVD; No carotid bruits LYMPHATICS: No lymphadenopathy CARDIAC: ***RRR, no murmurs, rubs, gallops RESPIRATORY:  Clear to auscultation without rales, wheezing or rhonchi  ABDOMEN: Soft, non-tender, non-distended MUSCULOSKELETAL:  No edema; No deformity  SKIN: Warm and dry NEUROLOGIC:  Alert and oriented x 3 PSYCHIATRIC:  Normal affect   ASSESSMENT:    No diagnosis found. PLAN:    In order of problems listed above:  ***      {Are you ordering a CV Procedure (e.g. stress test, cath, DCCV, TEE, etc)?   Press F2         :[id number]}    Medication Adjustments/Labs and Tests Ordered: Current medicines are reviewed at length with the patient today.  Concerns regarding medicines are outlined above.  No orders of the defined types were placed in this encounter.  No orders of the defined types were placed in this encounter.   There are no Patient Instructions on file for this visit.   Signed, Freada Bergeron, MD  09/12/2021 10:34 AM    [place]

## 2021-09-14 ENCOUNTER — Other Ambulatory Visit: Payer: Self-pay | Admitting: Internal Medicine

## 2021-09-17 ENCOUNTER — Ambulatory Visit: Payer: No Typology Code available for payment source | Admitting: Cardiology

## 2021-09-22 ENCOUNTER — Telehealth: Payer: Self-pay

## 2021-09-22 NOTE — Telephone Encounter (Signed)
scheduled

## 2021-09-22 NOTE — Telephone Encounter (Signed)
Patient called asking to be seen on Wednesday. She has been sick for 2 weeks. Started with laryngitis and a wet cough, now has a dry cough and still no voice. Her husband had it too and he is already almost over it. She was seen at urgent care and put on Amoxicillin and nose spray. She has finished and is not any better. Please advise. Wednesday is her only day off of work and she has no PTO left.

## 2021-09-24 ENCOUNTER — Other Ambulatory Visit: Payer: Self-pay | Admitting: Internal Medicine

## 2021-09-24 ENCOUNTER — Ambulatory Visit: Payer: No Typology Code available for payment source | Admitting: Internal Medicine

## 2021-09-24 ENCOUNTER — Encounter: Payer: Self-pay | Admitting: Internal Medicine

## 2021-09-24 VITALS — BP 130/90 | HR 95 | Temp 97.3°F

## 2021-09-24 DIAGNOSIS — R8290 Unspecified abnormal findings in urine: Secondary | ICD-10-CM

## 2021-09-24 DIAGNOSIS — J22 Unspecified acute lower respiratory infection: Secondary | ICD-10-CM | POA: Diagnosis not present

## 2021-09-24 DIAGNOSIS — E1165 Type 2 diabetes mellitus with hyperglycemia: Secondary | ICD-10-CM | POA: Diagnosis not present

## 2021-09-24 DIAGNOSIS — M059 Rheumatoid arthritis with rheumatoid factor, unspecified: Secondary | ICD-10-CM

## 2021-09-24 LAB — POC COVID19 BINAXNOW: SARS Coronavirus 2 Ag: NEGATIVE

## 2021-09-24 MED ORDER — CEFTRIAXONE SODIUM 1 G IJ SOLR
1.0000 g | Freq: Once | INTRAMUSCULAR | Status: AC
  Administered 2021-09-24: 1 g via INTRAMUSCULAR

## 2021-09-24 MED ORDER — AZITHROMYCIN 250 MG PO TABS: ORAL_TABLET | ORAL | 0 refills | Status: DC

## 2021-09-24 MED ORDER — METHYLPREDNISOLONE 4 MG PO TABS: ORAL_TABLET | ORAL | 0 refills | Status: DC

## 2021-09-24 MED ORDER — HYDROCODONE BIT-HOMATROP MBR 5-1.5 MG/5ML PO SOLN: 5.0000 mL | Freq: Three times a day (TID) | ORAL | 0 refills | Status: DC | PRN

## 2021-09-24 NOTE — Patient Instructions (Addendum)
1 g IM Rocephin given in office today.  Take Zithromax Z-PAK as directed 2 tablets day 1 followed by 1 tablet days 2 through 5.  Take Medrol Dosepak in the tapering course as directed for 6 days.  Hycodan 1 teaspoon every 6-8 hours as needed for cough.  Rest and drink fluids.  Call if not better in 10 days or sooner if worse.  COVID-19 test is negative

## 2021-09-24 NOTE — Progress Notes (Signed)
   Subjective:    Patient ID: Mary Herman, female    DOB: 24-Mar-1960, 61 y.o.   MRN: [id number]  HPI 61 year old Female seen with protracted respiratory infection symptoms for several weeks.  Patient had a visit on July 20 for which Flomax, over-the-counter decongestant, and Mucinex were recommended.  She has not improved.  She does have history of diabetes mellitus, rheumatoid arthritis, hypothyroidism, DMARD therapy with Arava, hypertension, hyperlipidemia obesity, anxiety and depression.  Hemoglobin A1c in March of this year was stable at 5.9% and was 5.9% in May.  She has been out of work some because of fatigue cough and congestion.  Has used up all of her time off.    Review of Systems fatigue, cough sometimes productive, chest and head congestion.     Objective:   Physical Exam Blood pressure 130/90 temperature 97.3 degrees by ear thermometer pulse 95 regular pulse oximetry 98% on room air  Skin: Warm and dry.  Pharynx is injected without exudate.  TMs basically clear.  Neck is supple without adenopathy.  Chest is clear to auscultation but she sounds congested when she speaks and has a deep congested cough.       Assessment & Plan:  Acute lower respiratory infection.  I do not hear pneumonia on chest exam.  I do not think a chest x-ray would be helpful at this point.  Rheumatoid arthritis on DMARD therapy  Morbid obesity  Type 2 diabetes mellitus treated with Januvia  Hypothyroidism treated with levothyroxine  Hypertension treated with Toprol  History of Roux-en-Y gastric bypass  Depression  History of vitamin D deficiency  Allergic rhinitis  Hyperlipidemia treated with Crestor  Plan: Zithromax Z-PAK 2 tabs day 1 followed by 1 tab days 2 through 5.  Hycodan 1 teaspoon every 8 hours as needed for cough.  Rocephin 1 g IM given in office today.  Medrol 4 mg tablets to take in tapering course as directed starting with 6 tablets day 1 and decreasing by 1 tablet  daily.  Rest and drink fluids.  COVID-19 test is negative.  Respiratory virus panel taken and is pending.  Plan: She will take Hycodan 1 teaspoon every 8 hours as needed for cough.  She will take Zithromax Z-PAK 2 tabs day 1 followed by 1 tab days 2 through 5.  She was given 1 g IM Rocephin.  She will take Medrol Dosepak in tapering course as directed starting with 6 x4 mg tablets and decreasing by 1 tablet daily.    Addendum: contacted pharmacy and there is a new warning for Citalopram and azithromycin with potential for prolonged QT interval. Change to Levaquin 500 mg daily for 7 days. Verbal order given and written order sent.

## 2021-09-25 ENCOUNTER — Other Ambulatory Visit: Payer: Self-pay | Admitting: Internal Medicine

## 2021-09-25 ENCOUNTER — Other Ambulatory Visit: Payer: Self-pay

## 2021-09-25 DIAGNOSIS — J22 Unspecified acute lower respiratory infection: Secondary | ICD-10-CM

## 2021-09-25 MED ORDER — DOXYCYCLINE HYCLATE 100 MG PO TABS: 100.0000 mg | ORAL_TABLET | Freq: Two times a day (BID) | ORAL | 0 refills | Status: AC

## 2021-09-25 NOTE — Telephone Encounter (Signed)
Pharmacy states that the azithromycin interacts with citalopram causing QT prolongation and arrhthymias. Do you want them to fill it or change medication?

## 2021-09-25 NOTE — Telephone Encounter (Signed)
Pharmacy comment: Alternative Requested:COULD WE GET A DIFFERENT ABX PLEASE PER PATIENT REQUEST DUE TO DDI.

## 2021-10-13 ENCOUNTER — Telehealth: Payer: Self-pay | Admitting: Internal Medicine

## 2021-10-13 NOTE — Telephone Encounter (Signed)
After talking with Dr Renold Genta she said to schedule Video visit

## 2021-10-13 NOTE — Telephone Encounter (Signed)
Mary Herman [phone]  Juliann Pulse called to say she has a rash under her breast and on her belly, she would like to get that  medication for yeast that you call in for her. I let her know you would need some type of visit.

## 2021-10-14 ENCOUNTER — Telehealth: Payer: No Typology Code available for payment source | Admitting: Internal Medicine

## 2021-10-14 ENCOUNTER — Encounter: Payer: Self-pay | Admitting: Internal Medicine

## 2021-10-14 ENCOUNTER — Telehealth: Payer: Self-pay | Admitting: Internal Medicine

## 2021-10-14 DIAGNOSIS — L304 Erythema intertrigo: Secondary | ICD-10-CM

## 2021-10-14 DIAGNOSIS — R490 Dysphonia: Secondary | ICD-10-CM | POA: Diagnosis not present

## 2021-10-14 MED ORDER — FLUCONAZOLE 150 MG PO TABS: 150.0000 mg | ORAL_TABLET | Freq: Once | ORAL | 1 refills | Status: AC

## 2021-10-14 NOTE — Telephone Encounter (Signed)
Whitney from [facility] called to inquire if it was still okay for patient to get fluconazole (DIFLUCAN) 150 MG tablet with her taking citalopram (CELEXA) 20 MG tablet because it can cause abnormal heart rhythm. She said please call back at [phone] if okay to dispense.

## 2021-10-14 NOTE — Telephone Encounter (Signed)
Called and spoke with Whitney and she will give patient medication, I let her know you approved it.

## 2021-10-14 NOTE — Progress Notes (Signed)
   Subjective:    Patient ID: Mary Herman, female    DOB: 06-29-1960, 61 y.o.   MRN: [id number]  HPI She was seen on August 2 with an acute lower respiratory infection.  She was treated with Zithromax, Hycodan, tapering course of Medrol over 6 days.  She was given 1 g IM Rocephin.  She says she has now been hoarse for 5 weeks.  This is unusual for her.  She has a history of allergic rhinitis, vitamin D deficiency, depression, history of Roux-en-Y gastric bypass, hypertension and hypothyroidism.  She has history of Rheumatoid arthritis and is on DMARD therapy.    Review of Systems tends to get intertrigo when she takes steroids Is asking for Diflucan for that    Objective:   Physical Exam  She sounds hoarse when she speaks.  Does not have productive cough.  Just remains hoarse.  Has issues now with intertrigo under breast and under folds in her abdomen      Assessment & Plan:  Intertrigo  Persistent hoarseness for 5 weeks  Plan: I am concerned about this prolonged hoarseness.  She has never had hoarseness for this amount of time in the past she says.  I would like for her to see ENT physician.  In the meantime I am sending in Diflucan 150 mg tablet for intertrigo with 1 refill.

## 2021-10-18 NOTE — Progress Notes (Unsigned)
Cardiology Office Note:    Date:  10/18/2021   ID:  Mary Herman, DOB [birth year], MRN [id number]  PCP:  Elby Showers, MD   [place] Providers Cardiologist:  None { Click to update primary MD,subspecialty MD or APP then REFRESH:1}    Referring MD: Elby Showers, MD   No chief complaint on file. ***  History of Present Illness:    Mary Herman is a 61 y.o. female with a hx of ***  Past Medical History:  Diagnosis Date   Anemia    Chronic back pain    Depression    Dyspnea    with activity   Environmental allergies    Family history of adverse reaction to anesthesia    daughter- n/v       Fibroid    GERD (gastroesophageal reflux disease)    Hematuria    History of cystitis    INTERSTITIAL CYSTITIS--  NO ISSUES FOR YRS   History of gastroesophageal reflux (GERD)    DENIES ISSUES SINCE GASTRIC BY PASS   History of hypertension    NO ISSUE SINCE WT LOSS AFTER GASTRIC BYPASS   History of kidney stones    History of obstructive sleep apnea    USED CPAP--  NO ISSUE SINCE WT LOSS AFTER GASTRIC BYPASS   Hypertension    Pneumonia    Pre-diabetes    pt. denies   Rheumatoid arthritis ([place])    Right ureteral stone    Sleep apnea    no cpap    Thyroid disease    Trochanteric bursitis of both hips     Past Surgical History:  Procedure Laterality Date   CARDIAC CATHETERIZATION  12-07-2006  DR [facility]   NORMAL CORONARIES ARTERIES/ NORMAL LVF/ MILD INCREASED END-DIASTOLIC PRESSURE   CESAREAN SECTION  1998   CHOLECYSTECTOMY  1980'S   CYSTOSCOPY WITH RETROGRADE PYELOGRAM, URETEROSCOPY AND STENT PLACEMENT Bilateral 12/26/2012   Procedure: cystoscopy with bilateral retrogrades, bilateral ureteroscopy ;  Surgeon: Bernestine Amass, MD;  Location: [facility]  Service: Urology;  Laterality: Bilateral;   CYSTOSCOPY WITH STENT PLACEMENT Bilateral 12/26/2012   Procedure: CYSTOSCOPY WITH STENT PLACEMENT;  Surgeon: Bernestine Amass, MD;  Location:  [place];  Service: Urology;  Laterality: Bilateral;   CYSTOSCOPY/ HYDRODISTENTION  YRS AGO   DILATATION & CURETTAGE/HYSTEROSCOPY WITH MYOSURE N/A 11/25/2018   Procedure: DILATATION & CURETTAGE/HYSTEROSCOPY WITH MYOSURE;  Surgeon: Anastasio Auerbach, MD;  Location: [place];  Service: Gynecology;  Laterality: N/A;   HERNIA REPAIR     HOLMIUM LASER APPLICATION Right 29/93/7169   Procedure: HOLMIUM LASER APPLICATION;  Surgeon: Bernestine Amass, MD;  Location: [facility];  Service: Urology;  Laterality: Right;   KNEE ARTHROSCOPY Left 1992   ROBOTIC ASSISTED TOTAL HYSTERECTOMY WITH BILATERAL SALPINGO OOPHERECTOMY N/A 01/31/2019   Procedure: XI ROBOTIC ASSISTED TOTAL HYSTERECTOMY WITH BILATERAL SALPINGO OOPHORECTOMY;  Surgeon: Everitt Amber, MD;  Location: [facility];  Service: Gynecology;  Laterality: N/A;   ROUX-EN-Y PROCEDURE  07/25/2007   SHOULDER ARTHROSCOPY Right 2011   TMJ ARTHROPLASTY  1990'S   BILATERAL UPPER   TOTAL ABDOMINAL HYSTERECTOMY      Current Medications: No outpatient medications have been marked as taking for the 10/22/21 encounter (Appointment) with Freada Bergeron, MD.     Allergies:   Codeine and Methotrexate   Social History   Socioeconomic History   Marital status: Married    Spouse name: Not on file  Number of children: Not on file   Years of education: Not on file   Highest education level: Not on file  Occupational History   Not on file  Tobacco Use   Smoking status: Never   Smokeless tobacco: Never  Vaping Use   Vaping Use: Never used  Substance and Sexual Activity   Alcohol use: No   Drug use: No   Sexual activity: Not Currently    Partners: Male    Birth control/protection: Surgical    Comment: husband vasectomy-1st intercourse 26 yo-Fewer than 5 partners  Other Topics Concern   Not on file  Social History Narrative   Not on file   Social Determinants of Health   Financial Resource Strain: Not on file  Food  Insecurity: Not on file  Transportation Needs: Not on file  Physical Activity: Not on file  Stress: Not on file  Social Connections: Not on file     Family History: The patient's ***family history includes Cancer in her maternal grandfather and maternal grandmother; Diabetes in her maternal grandmother; Heart disease in her maternal uncle; Hypertension in her brother and mother; Stroke in her mother.  ROS:   Please see the history of present illness.    *** All other systems reviewed and are negative.  EKGs/Labs/Other Studies Reviewed:    The following studies were reviewed today: ***  EKG:  EKG is *** ordered today.  The ekg ordered today demonstrates ***  Recent Labs: 05/13/2021: ALT 18; BUN 13; Creat 0.91; Hemoglobin 14.6; Platelets 216; Potassium 5.1; Sodium 144; TSH 0.57  Recent Lipid Panel    Component Value Date/Time   CHOL 170 05/13/2021 1052   TRIG 102 05/13/2021 1052   HDL 88 05/13/2021 1052   CHOLHDL 1.9 05/13/2021 1052   VLDL 22 02/07/2013 1211   LDLCALC 63 05/13/2021 1052     Risk Assessment/Calculations:   {Does this patient have ATRIAL FIBRILLATION?:[phone]}  No BP recorded.  {Refresh Note OR Click here to enter BP  :1}***         Physical Exam:    VS:  LMP 05/25/2014     Wt Readings from Last 3 Encounters:  08/06/21 297 lb (134.7 kg)  07/07/21 295 lb 12 oz (134.2 kg)  05/13/21 (!) 303 lb 8 oz (137.7 kg)     GEN: *** Well nourished, well developed in no acute distress HEENT: Normal NECK: No JVD; No carotid bruits LYMPHATICS: No lymphadenopathy CARDIAC: ***RRR, no murmurs, rubs, gallops RESPIRATORY:  Clear to auscultation without rales, wheezing or rhonchi  ABDOMEN: Soft, non-tender, non-distended MUSCULOSKELETAL:  No edema; No deformity  SKIN: Warm and dry NEUROLOGIC:  Alert and oriented x 3 PSYCHIATRIC:  Normal affect   ASSESSMENT:    No diagnosis found. PLAN:    In order of problems listed above:  ***      {Are you ordering  a CV Procedure (e.g. stress test, cath, DCCV, TEE, etc)?   Press F2        :[id number]}    Medication Adjustments/Labs and Tests Ordered: Current medicines are reviewed at length with the patient today.  Concerns regarding medicines are outlined above.  No orders of the defined types were placed in this encounter.  No orders of the defined types were placed in this encounter.   There are no Patient Instructions on file for this visit.   Signed, Freada Bergeron, MD  10/18/2021 2:15 PM    [place]

## 2021-10-20 ENCOUNTER — Other Ambulatory Visit: Payer: Self-pay | Admitting: Internal Medicine

## 2021-10-22 ENCOUNTER — Encounter: Payer: Self-pay | Admitting: Cardiology

## 2021-10-22 ENCOUNTER — Ambulatory Visit: Payer: No Typology Code available for payment source | Attending: Cardiology | Admitting: Cardiology

## 2021-10-22 VITALS — BP 128/82 | HR 107 | Ht 63.0 in | Wt 286.2 lb

## 2021-10-22 DIAGNOSIS — I10 Essential (primary) hypertension: Secondary | ICD-10-CM

## 2021-10-22 DIAGNOSIS — R9431 Abnormal electrocardiogram [ECG] [EKG]: Secondary | ICD-10-CM

## 2021-10-22 DIAGNOSIS — E782 Mixed hyperlipidemia: Secondary | ICD-10-CM | POA: Diagnosis not present

## 2021-10-22 NOTE — Patient Instructions (Signed)
Medication Instructions:   Your physician recommends that you continue on your current medications as directed. Please refer to the Current Medication list given to you today.  *If you need a refill on your cardiac medications before your next appointment, please call your pharmacy*   Testing/Procedures:  Your physician has requested that you have an echocardiogram. Echocardiography is a painless test that uses sound waves to create images of your heart. It provides your doctor with information about the size and shape of your heart and how well your heart's chambers and valves are working. This procedure takes approximately one hour. There are no restrictions for this procedure.    How to Prepare for Your Cardiac PET/CT Stress Test:  1. Please do not take these medications before your test:   Medications that may interfere with the cardiac pharmacological stress agent (ex. nitrates - including erectile dysfunction medications or beta-blockers) the day of the exam. (Erectile dysfunction medication should be held for at least 72 hrs prior to test) HOLD YOUR METOPROLOL 72 HOURS PRIOR TO THE TEST Your remaining medications may be taken with water.  2. Nothing to eat or drink, except water, 3 hours prior to arrival time.   NO caffeine/decaffeinated products, or chocolate 12 hours prior to arrival.  3. NO perfume, cologne or lotion  4. Total time is 1 to 2 hours; you may want to bring reading material for the waiting time.  5. Please report to Admitting at the [facility] Main Entrance 60 minutes early for your test.  [place], [place] [address]  Diabetic Preparation:  Hold oral medications. You may take NPH and Lantus insulin. Do not take Humalog or Humulin R (Regular Insulin) the day of your test. Check blood sugars prior to leaving the house. If able to eat breakfast prior to 3 hour fasting, you may take all medications, including your insulin, Do not  worry if you miss your breakfast dose of insulin - start at your next meal.  IF YOU THINK YOU MAY BE PREGNANT, OR ARE NURSING PLEASE INFORM THE TECHNOLOGIST.  In preparation for your appointment, medication and supplies will be purchased.  Appointment availability is limited, so if you need to cancel or reschedule, please call the Radiology Department at [phone]  24 hours in advance to avoid a cancellation fee of $100.00  What to Expect After you Arrive:  Once you arrive and check in for your appointment, you will be taken to a preparation room within the Radiology Department.  A technologist or Nurse will obtain your medical history, verify that you are correctly prepped for the exam, and explain the procedure.  Afterwards,  an IV will be started in your arm and electrodes will be placed on your skin for EKG monitoring during the stress portion of the exam. Then you will be escorted to the PET/CT scanner.  There, staff will get you positioned on the scanner and obtain a blood pressure and EKG.  During the exam, you will continue to be connected to the EKG and blood pressure machines.  A small, safe amount of a radioactive tracer will be injected in your IV to obtain a series of pictures of your heart along with an injection of a stress agent.    After your Exam:  It is recommended that you eat a meal and drink a caffeinated beverage to counter act any effects of the stress agent.  Drink plenty of fluids for the remainder of the day and  urinate frequently for the first couple of hours after the exam.  Your doctor will inform you of your test results within 7-10 business days.  For questions about your test or how to prepare for your test, please call: Marchia Bond, Cardiac Imaging Nurse Navigator  Gordy Clement, Cardiac Imaging Nurse Navigator Office: [phone]    Follow-Up: At [facility], you and your health needs are our priority.  As part of our continuing mission to  provide you with exceptional heart care, we have created designated Provider Care Teams.  These Care Teams include your primary Cardiologist (physician) and Advanced Practice Providers (APPs -  Physician Assistants and Nurse Practitioners) who all work together to provide you with the care you need, when you need it.  We recommend signing up for the patient portal called "MyChart".  Sign up information is provided on this After Visit Summary.  MyChart is used to connect with patients for Virtual Visits (Telemedicine).  Patients are able to view lab/test results, encounter notes, upcoming appointments, etc.  Non-urgent messages can be sent to your provider as well.   To learn more about what you can do with MyChart, go to [email or web address].    Your next appointment:   6 month(s)  The format for your next appointment:   In Person  Provider:   DR. Johney Frame  Important Information About Sugar

## 2021-10-22 NOTE — Progress Notes (Signed)
Cardiology Office Note:    Date:  10/22/2021   ID:  Mary Herman, DOB [birth year], MRN [id number]  PCP:  Mary Showers, MD   [place] Providers Cardiologist:  None {   Referring MD: Mary Showers, MD    History of Present Illness:    Mary Herman is a 61 y.o. female with a hx of hypothyroidism, RA, depression, and prior gastric bypass who was referred by Dr. Renold Herman for further evaluation of abnormal ECG.  On 08/20/2021 her ECG showed sinus tachycardia, low voltage in precordial leads, old inferior infarct.  Today, the patient states that she overall feels okay. She has chronic low energy and is DOE which is stable. She mostly attributes her symptoms to her weight and deconditioning.  She notes that she was diagnosed with diabetes about 3 months ago. She was started on Januvia. Since her diagnosis she has stopped drinking sweet tea and has noticed some weight loss. She admits that her diet is not the best as she does not enjoy cooking. Frequently she may have frozen dinners such as lasagna or macaroni and beef. Previously she underwent bariatric surgery and had lost 115 lbs. She is trying to work on weight loss again. ments.   In her family, her mother had a brain aneurysm. She has a great uncle who had heart issues. One of her siblings has been on antihypertensives for a long time. No other known family history of heart disease.  She denies any palpitations, chest pain, or peripheral edema. No headaches, syncope, orthopnea, or PND.   Past Medical History:  Diagnosis Date   Anemia    Chronic back pain    Depression    Dyspnea    with activity   Environmental allergies    Family history of adverse reaction to anesthesia    daughter- n/v       Fibroid    GERD (gastroesophageal reflux disease)    Hematuria    History of cystitis    INTERSTITIAL CYSTITIS--  NO ISSUES FOR YRS   History of gastroesophageal reflux (GERD)    DENIES ISSUES SINCE GASTRIC BY PASS    History of hypertension    NO ISSUE SINCE WT LOSS AFTER GASTRIC BYPASS   History of kidney stones    History of obstructive sleep apnea    USED CPAP--  NO ISSUE SINCE WT LOSS AFTER GASTRIC BYPASS   Hypertension    Pneumonia    Pre-diabetes    pt. denies   Rheumatoid arthritis ([place])    Right ureteral stone    Sleep apnea    no cpap    Thyroid disease    Trochanteric bursitis of both hips     Past Surgical History:  Procedure Laterality Date   CARDIAC CATHETERIZATION  12-07-2006  DR [facility]   NORMAL CORONARIES ARTERIES/ NORMAL LVF/ MILD INCREASED END-DIASTOLIC PRESSURE   CESAREAN SECTION  1998   CHOLECYSTECTOMY  1980'S   CYSTOSCOPY WITH RETROGRADE PYELOGRAM, URETEROSCOPY AND STENT PLACEMENT Bilateral 12/26/2012   Procedure: cystoscopy with bilateral retrogrades, bilateral ureteroscopy ;  Surgeon: Mary Amass, MD;  Location: [facility];  Service: Urology;  Laterality: Bilateral;   CYSTOSCOPY WITH STENT PLACEMENT Bilateral 12/26/2012   Procedure: CYSTOSCOPY WITH STENT PLACEMENT;  Surgeon: Mary Amass, MD;  Location: [facility];  Service: Urology;  Laterality: Bilateral;   CYSTOSCOPY/ HYDRODISTENTION  YRS AGO   DILATATION & CURETTAGE/HYSTEROSCOPY WITH MYOSURE N/A 11/25/2018   Procedure: DILATATION &  CURETTAGE/HYSTEROSCOPY WITH MYOSURE;  Surgeon: Mary Auerbach, MD;  Location: [facility];  Service: Gynecology;  Laterality: N/A;   HERNIA REPAIR     HOLMIUM LASER APPLICATION Right 56/31/4970   Procedure: HOLMIUM LASER APPLICATION;  Surgeon: Mary Amass, MD;  Location: [facility];  Service: Urology;  Laterality: Right;   KNEE ARTHROSCOPY Left 1992   ROBOTIC ASSISTED TOTAL HYSTERECTOMY WITH BILATERAL SALPINGO OOPHERECTOMY N/A 01/31/2019   Procedure: XI ROBOTIC ASSISTED TOTAL HYSTERECTOMY WITH BILATERAL SALPINGO OOPHORECTOMY;  Surgeon: Mary Amber, MD;  Location: [facility];  Service: Gynecology;  Laterality: N/A;   ROUX-EN-Y PROCEDURE   07/25/2007   SHOULDER ARTHROSCOPY Right 2011   TMJ ARTHROPLASTY  1990'S   BILATERAL UPPER   TOTAL ABDOMINAL HYSTERECTOMY      Current Medications: Current Meds  Medication Sig   buPROPion (WELLBUTRIN XL) 300 MG 24 hr tablet TAKE 1 TABLET BY MOUTH EVERY DAY   celecoxib (CELEBREX) 200 MG capsule TAKE 1 CAPSULE BY MOUTH TWICE A DAY   Cholecalciferol (VITAMIN D) 50 MCG (2000 UT) tablet Take 2,000 Units by mouth at bedtime.   citalopram (CELEXA) 20 MG tablet TAKE 1 TABLET BY MOUTH EVERY DAY IN THE MORNING   clotrimazole-betamethasone (LOTRISONE) cream APPLY TO AFFECTED AREA TWICE A DAY   cyclobenzaprine (FLEXERIL) 10 MG tablet TAKE 1 TABLET BY MOUTH EVERYDAY AT BEDTIME   diazepam (VALIUM) 5 MG tablet TAKE 1 TABLET BY MOUTH EVERY DAY AT BEDTIME AS NEEDED   folic acid (FOLVITE) 1 MG tablet Take 1 mg by mouth daily.   HUMIRA PEN 40 MG/0.4ML PNKT SMARTSIG:40 Milligram(s) SUB-Q Every 2 Weeks   HYDROcodone-acetaminophen (NORCO/VICODIN) 5-325 MG tablet Take 1 tablet by mouth every 6 (six) hours as needed for moderate pain.   JANUVIA 50 MG tablet TAKE 1 TABLET BY MOUTH EVERY DAY   leflunomide (ARAVA) 20 MG tablet Take 20 mg by mouth daily.   levothyroxine (SYNTHROID) 75 MCG tablet TAKE 1 TABLET BY MOUTH EVERY DAY   Melatonin 10 MG CAPS Take 10 mg by mouth at bedtime.   metoprolol succinate (TOPROL-XL) 25 MG 24 hr tablet TAKE 1 TABLET BY MOUTH EVERY DAY   montelukast (SINGULAIR) 10 MG tablet TAKE 1 TABLET BY MOUTH EVERYDAY AT BEDTIME   OVER THE COUNTER MEDICATION Take 1 tablet by mouth at bedtime. Bariatric Iron   rosuvastatin (CRESTOR) 5 MG tablet TAKE 1 TABLET BY MOUTH THREE TIMES A WEEK.   Vitamin D, Ergocalciferol, (DRISDOL) 1.25 MG (50000 UNIT) CAPS capsule TAKE 1 CAPSULE BY MOUTH ONE TIME PER WEEK     Allergies:   Codeine and Methotrexate   Social History   Socioeconomic History   Marital status: Married    Spouse name: Not on file   Number of children: Not on file   Years of  education: Not on file   Highest education level: Not on file  Occupational History   Not on file  Tobacco Use   Smoking status: Never   Smokeless tobacco: Never  Vaping Use   Vaping Use: Never used  Substance and Sexual Activity   Alcohol use: No   Drug use: No   Sexual activity: Not Currently    Partners: Male    Birth control/protection: Surgical    Comment: husband vasectomy-1st intercourse 26 yo-Fewer than 5 partners  Other Topics Concern   Not on file  Social History Narrative   Not on file   Social Determinants of Health   Financial Resource Strain: Not on file  Food Insecurity: Not on file  Transportation Needs: Not on file  Physical Activity: Not on file  Stress: Not on file  Social Connections: Not on file     Family History: The patient's family history includes Cancer in her maternal grandfather and maternal grandmother; Diabetes in her maternal grandmother; Heart disease in her maternal uncle; Hypertension in her brother and mother; Stroke in her mother.  ROS:   Review of Systems  Constitutional:  Positive for malaise/fatigue. Negative for chills and fever.  HENT:  Negative for nosebleeds and tinnitus.   Eyes:  Negative for double vision and discharge.  Respiratory:  Positive for shortness of breath. Negative for cough and hemoptysis.   Cardiovascular:  Negative for chest pain, palpitations, orthopnea, claudication, leg swelling and PND.  Gastrointestinal:  Negative for constipation and vomiting.  Genitourinary:  Negative for dysuria.  Musculoskeletal:  Positive for joint pain. Negative for falls.  Neurological:  Positive for dizziness. Negative for loss of consciousness and headaches.  Psychiatric/Behavioral:  Negative for hallucinations and substance abuse.      EKGs/Labs/Other Studies Reviewed:    The following studies were reviewed today:  2D Echo  11/03/2011: Findings: LVEF 65-70% Mild concentric left ventricular hypertrophy. The aortic valve  is trileaflet. No aortic stenosis or regurgitation. Mild mitral regurgitation. Trace tricuspid regurgitation. Right ventricular systolic pressure is 09.38 mm Hg.   EKG:  EKG is personally reviewed. 10/22/2021:  Sinus tachycardia. Rate 107 bpm. LAD. Inferior infarct. Anterior infarct.   Recent Labs: 05/13/2021: ALT 18; BUN 13; Creat 0.91; Hemoglobin 14.6; Platelets 216; Potassium 5.1; Sodium 144; TSH 0.57   Recent Lipid Panel    Component Value Date/Time   CHOL 170 05/13/2021 1052   TRIG 102 05/13/2021 1052   HDL 88 05/13/2021 1052   CHOLHDL 1.9 05/13/2021 1052   VLDL 22 02/07/2013 1211   LDLCALC 63 05/13/2021 1052     Risk Assessment/Calculations:                Physical Exam:    VS:  BP 128/82   Pulse (!) 107   Ht '5\' 3"'$  (1.6 m)   Wt 286 lb 3.2 oz (129.8 kg)   LMP 05/25/2014   SpO2 92%   BMI 50.70 kg/m     Wt Readings from Last 3 Encounters:  10/22/21 286 lb 3.2 oz (129.8 kg)  08/06/21 297 lb (134.7 kg)  07/07/21 295 lb 12 oz (134.2 kg)     GEN: Well nourished, well developed in no acute distress HEENT: Normal NECK: No JVD; No carotid bruits CARDIAC: RRR, no murmurs, rubs, gallops RESPIRATORY:  Clear to auscultation without rales, wheezing or rhonchi  ABDOMEN: Morbidly obese, NTTP MUSCULOSKELETAL:  No edema; No deformity  SKIN: Warm and dry NEUROLOGIC:  Alert and oriented x 3 PSYCHIATRIC:  Normal affect   ASSESSMENT:    1. Abnormal EKG   2. Mixed hyperlipidemia   3. Primary hypertension   4. Morbid obesity ([place])    PLAN:    In order of problems listed above:  #Abnormal ECG: Patient with ECG with anterior and inferior q-waves. Has chronic dyspnea on exertion and fatigue which she relates to obesity. No known history of CAD but given risk factors and abnormal ECG, will check TTE and NM PET for further evaluation. -Check TTE -Check NM PET scan  #HLD: -Continue crestor '5mg'$  daily -LDL well controlled at 63  #HTN: Well controlled. -Continue  metop '25mg'$  XL daily  #Morbid Obesity:  BMI 50. Suspect this is contributing  to DOE and chronic fatigue. Had gastric bypass in the past but gained weight back. Will look into coverage for GLP-1 agonist. Discussed dietary modifications as well today. -Check into coverage for GLP-1 agonist      Shared Decision Making/Informed Consent The risks [chest pain, shortness of breath, cardiac arrhythmias, dizziness, blood pressure fluctuations, myocardial infarction, stroke/transient ischemic attack, nausea, vomiting, allergic reaction, radiation exposure, metallic taste sensation and life-threatening complications (estimated to be 1 in 10,000)], benefits (risk stratification, diagnosing coronary artery disease, treatment guidance) and alternatives of a cardiac PET stress test were discussed in detail with Ms. Zigmund Daniel and she agrees to proceed.  Follow-up:  6 months.  Medication Adjustments/Labs and Tests Ordered: Current medicines are reviewed at length with the patient today.  Concerns regarding medicines are outlined above.   Orders Placed This Encounter  Procedures   NM PET CT CARDIAC PERFUSION MULTI W/ABSOLUTE BLOODFLOW   EKG 12-Lead   ECHOCARDIOGRAM COMPLETE   No orders of the defined types were placed in this encounter.  Patient Instructions  Medication Instructions:   Your physician recommends that you continue on your current medications as directed. Please refer to the Current Medication list given to you today.  *If you need a refill on your cardiac medications before your next appointment, please call your pharmacy*   Testing/Procedures:  Your physician has requested that you have an echocardiogram. Echocardiography is a painless test that uses sound waves to create images of your heart. It provides your doctor with information about the size and shape of your heart and how well your heart's chambers and valves are working. This procedure takes approximately one hour. There are no  restrictions for this procedure.    How to Prepare for Your Cardiac PET/CT Stress Test:  1. Please do not take these medications before your test:   Medications that may interfere with the cardiac pharmacological stress agent (ex. nitrates - including erectile dysfunction medications or beta-blockers) the day of the exam. (Erectile dysfunction medication should be held for at least 72 hrs prior to test) HOLD YOUR METOPROLOL 72 HOURS PRIOR TO THE TEST Your remaining medications may be taken with water.  2. Nothing to eat or drink, except water, 3 hours prior to arrival time.   NO caffeine/decaffeinated products, or chocolate 12 hours prior to arrival.  3. NO perfume, cologne or lotion  4. Total time is 1 to 2 hours; you may want to bring reading material for the waiting time.  5. Please report to Admitting at the [facility] Main Entrance 60 minutes early for your test.  [place], [place] [address]  Diabetic Preparation:  Hold oral medications. You may take NPH and Lantus insulin. Do not take Humalog or Humulin R (Regular Insulin) the day of your test. Check blood sugars prior to leaving the house. If able to eat breakfast prior to 3 hour fasting, you may take all medications, including your insulin, Do not worry if you miss your breakfast dose of insulin - start at your next meal.  IF YOU THINK YOU MAY BE PREGNANT, OR ARE NURSING PLEASE INFORM THE TECHNOLOGIST.  In preparation for your appointment, medication and supplies will be purchased.  Appointment availability is limited, so if you need to cancel or reschedule, please call the Radiology Department at [phone]  24 hours in advance to avoid a cancellation fee of $100.00  What to Expect After you Arrive:  Once you arrive and check in for your appointment, you  will be taken to a preparation room within the Radiology Department.  A technologist or Nurse will obtain your medical history, verify that  you are correctly prepped for the exam, and explain the procedure.  Afterwards,  an IV will be started in your arm and electrodes will be placed on your skin for EKG monitoring during the stress portion of the exam. Then you will be escorted to the PET/CT scanner.  There, staff will get you positioned on the scanner and obtain a blood pressure and EKG.  During the exam, you will continue to be connected to the EKG and blood pressure machines.  A small, safe amount of a radioactive tracer will be injected in your IV to obtain a series of pictures of your heart along with an injection of a stress agent.    After your Exam:  It is recommended that you eat a meal and drink a caffeinated beverage to counter act any effects of the stress agent.  Drink plenty of fluids for the remainder of the day and urinate frequently for the first couple of hours after the exam.  Your doctor will inform you of your test results within 7-10 business days.  For questions about your test or how to prepare for your test, please call: Marchia Bond, Cardiac Imaging Nurse Navigator  Gordy Clement, Cardiac Imaging Nurse Navigator Office: [phone]    Follow-Up: At [facility], you and your health needs are our priority.  As part of our continuing mission to provide you with exceptional heart care, we have created designated Provider Care Teams.  These Care Teams include your primary Cardiologist (physician) and Advanced Practice Providers (APPs -  Physician Assistants and Nurse Practitioners) who all work together to provide you with the care you need, when you need it.  We recommend signing up for the patient portal called "MyChart".  Sign up information is provided on this After Visit Summary.  MyChart is used to connect with patients for Virtual Visits (Telemedicine).  Patients are able to view lab/test results, encounter notes, upcoming appointments, etc.  Non-urgent messages can be sent to your provider as  well.   To learn more about what you can do with MyChart, go to [email or web address].    Your next appointment:   6 month(s)  The format for your next appointment:   In Person  Provider:   DR. Johney Frame  Important Information About Sugar         I,Mathew Stumpf,acting as a scribe for Freada Bergeron, MD.,have documented all relevant documentation on the behalf of Freada Bergeron, MD,as directed by  Freada Bergeron, MD while in the presence of Freada Bergeron, MD.  I, Freada Bergeron, MD, have reviewed all documentation for this visit. The documentation on 10/22/21 for the exam, diagnosis, procedures, and orders are all accurate and complete.   Signed, Freada Bergeron, MD  10/22/2021 11:33 AM    [place]

## 2021-10-28 ENCOUNTER — Telehealth: Payer: Self-pay | Admitting: Pharmacist

## 2021-10-28 MED ORDER — SEMAGLUTIDE(0.25 OR 0.5MG/DOS) 2 MG/3ML ~~LOC~~ SOPN: PEN_INJECTOR | SUBCUTANEOUS | 1 refills | Status: DC

## 2021-10-28 NOTE — Telephone Encounter (Signed)
Called pt at the request of Dr. Johney Frame to discuss Ozmepic. Pt currently on Januvia for diabetes.  Pt denies any personal or family hx of MENS2 or MTC. No hx of pancreatitis. Does have hx of gallstones. Gallbladder has been removed. Pt counseled on side effects of n/v/d/c. Dose tittration reviewed. She is comfortable with injection. Pt advised to get copay card. Rx sent to pharmacy. Next available apt not until Oct. Pt will start on medication since she is comfortable with injection, but will meet with me Oct 2 for diet and exercise review and medication review. Stop Januvia. She was given my phone number incase she has any questions.

## 2021-11-04 ENCOUNTER — Other Ambulatory Visit: Payer: Self-pay | Admitting: Internal Medicine

## 2021-11-11 ENCOUNTER — Telehealth: Payer: Self-pay | Admitting: *Deleted

## 2021-11-11 NOTE — Telephone Encounter (Signed)
Cardiac PET Scan scheduled for 12/23/21 at 1 pm.  Pt made aware of appt date and time by [facility] scheduling team.

## 2021-11-11 NOTE — Telephone Encounter (Signed)
-----   Message from Nuala Alpha, LPN sent at 10/22/9405 10:36 AM EDT ----- Regarding: CARDIAC PET SCAN PER DR. Johney Frame   -NM PET CT Cardiac perfusion multi ([id number]) ordered by Dr. Johney Frame  -please pre-cert and schedule   Thanks,  Karlene Einstein

## 2021-11-20 ENCOUNTER — Ambulatory Visit (HOSPITAL_COMMUNITY): Payer: No Typology Code available for payment source | Attending: Cardiology

## 2021-11-20 ENCOUNTER — Encounter: Payer: Self-pay | Admitting: Orthopaedic Surgery

## 2021-11-20 ENCOUNTER — Other Ambulatory Visit (INDEPENDENT_AMBULATORY_CARE_PROVIDER_SITE_OTHER): Payer: No Typology Code available for payment source

## 2021-11-20 ENCOUNTER — Ambulatory Visit: Payer: No Typology Code available for payment source | Admitting: Orthopaedic Surgery

## 2021-11-20 ENCOUNTER — Ambulatory Visit: Payer: No Typology Code available for payment source | Admitting: Internal Medicine

## 2021-11-20 ENCOUNTER — Encounter: Payer: Self-pay | Admitting: Internal Medicine

## 2021-11-20 VITALS — BP 116/84 | HR 85 | Temp 97.7°F | Ht 63.0 in | Wt 281.0 lb

## 2021-11-20 DIAGNOSIS — J309 Allergic rhinitis, unspecified: Secondary | ICD-10-CM | POA: Diagnosis not present

## 2021-11-20 DIAGNOSIS — M25562 Pain in left knee: Secondary | ICD-10-CM

## 2021-11-20 DIAGNOSIS — E039 Hypothyroidism, unspecified: Secondary | ICD-10-CM | POA: Diagnosis not present

## 2021-11-20 DIAGNOSIS — E1165 Type 2 diabetes mellitus with hyperglycemia: Secondary | ICD-10-CM | POA: Diagnosis not present

## 2021-11-20 DIAGNOSIS — M1711 Unilateral primary osteoarthritis, right knee: Secondary | ICD-10-CM | POA: Diagnosis not present

## 2021-11-20 DIAGNOSIS — F419 Anxiety disorder, unspecified: Secondary | ICD-10-CM

## 2021-11-20 DIAGNOSIS — F32A Depression, unspecified: Secondary | ICD-10-CM

## 2021-11-20 DIAGNOSIS — E559 Vitamin D deficiency, unspecified: Secondary | ICD-10-CM

## 2021-11-20 DIAGNOSIS — R490 Dysphonia: Secondary | ICD-10-CM | POA: Insufficient documentation

## 2021-11-20 DIAGNOSIS — M1712 Unilateral primary osteoarthritis, left knee: Secondary | ICD-10-CM

## 2021-11-20 DIAGNOSIS — R9431 Abnormal electrocardiogram [ECG] [EKG]: Secondary | ICD-10-CM | POA: Diagnosis present

## 2021-11-20 DIAGNOSIS — M25561 Pain in right knee: Secondary | ICD-10-CM

## 2021-11-20 DIAGNOSIS — E785 Hyperlipidemia, unspecified: Secondary | ICD-10-CM

## 2021-11-20 DIAGNOSIS — E1169 Type 2 diabetes mellitus with other specified complication: Secondary | ICD-10-CM

## 2021-11-20 DIAGNOSIS — G8929 Other chronic pain: Secondary | ICD-10-CM | POA: Diagnosis not present

## 2021-11-20 LAB — ECHOCARDIOGRAM COMPLETE
Area-P 1/2: 3.99 cm2
S' Lateral: 3 cm

## 2021-11-20 MED ORDER — LIDOCAINE HCL 1 % IJ SOLN
3.0000 mL | INTRAMUSCULAR | Status: AC | PRN
  Administered 2021-11-20: 3 mL

## 2021-11-20 MED ORDER — METHYLPREDNISOLONE ACETATE 40 MG/ML IJ SUSP
40.0000 mg | INTRAMUSCULAR | Status: AC | PRN
  Administered 2021-11-20: 40 mg via INTRA_ARTICULAR

## 2021-11-20 MED ORDER — PERFLUTREN LIPID MICROSPHERE
1.0000 mL | INTRAVENOUS | Status: AC | PRN
  Administered 2021-11-20: 1 mL via INTRAVENOUS

## 2021-11-20 MED ORDER — METHYLPREDNISOLONE ACETATE 80 MG/ML IJ SUSP
80.0000 mg | Freq: Once | INTRAMUSCULAR | Status: AC
  Administered 2021-11-20: 80 mg via INTRAMUSCULAR

## 2021-11-20 NOTE — Progress Notes (Signed)
   Subjective:    Patient ID: Mary Herman, female    DOB: 11-23-1960, 61 y.o.   MRN: [id number]  HPI 61 year old Female for 6 month follow up.  Saw Dr. Johney Frame who placed her on Ozempic and ordered Echo for today.  She is  also to have cardiac perfusion study.  She has chronic bilateral knee pain and got injections with steroids of both knees earlier today by Dr. Ninfa Linden.   Still has respiratory congestion. Says no sputum production. Has clear to greenish nasal discharge.Seeing ENT today and will let me know what they suggest.  Has had persistent hoarseness.  Not sure if this is allergy related or some vocal cord dysfunction.  Scheduled for labs today here including Hgb AIC  Has lost 14 pounds since May. Gave up sweet tea and trying to eat better.  She has a history of allergic rhinitis, vitamin D deficiency, depression, Roux-en-Y gastric bypass, hypertension and hypothyroidism.  Had Roux-en-Y gastric bypass surgery in 2009.  She saw Dr. Hollie Salk recently at [facility] regarding history of Roux-en-Y gastric bypass surgery and obesity.  He discussed patient cutting back on carbs that both she and her husband indulging in fairly frequently.  He noted she was down about 24 pounds from her preop weight.  He was agreeable with her trying Ozempic.  History of intertrigo treated with as needed Diflucan.  History of rheumatoid arthritis treated with DMARD therapy by rheumatology.    Review of Systems Allergic to cats- 2 sleep with her. Consider allergy testing.     Objective:   Physical Exam Blood pressure is excellent 116/84 pulse 85 temperature 97.7 pulse oximetry 98% weight 281 pounds BMI 49.78 height 5 feet 3 inches    Skin: Warm and dry.  She looks a bit pale.  She looks a bit fatigued.  Chest is clear.  Cardiac exam: Regular rate and rhythm.  No thyromegaly.  No appreciable cervical adenopathy.  She sounds hoarse when she speaks.     Assessment & Plan:     Probable Cat allergy and ? Other allergies- ?allergy testing.  Has been taking Xyzal and Singulair.  Hypothyroidism-TSH pending today on thyroid replacement medication  Hoarseness to see ENT today- ENT has placed her on antibiotic and pain currently told her adenoids were infected.  Needs to see allergist. Daughter getting married this weekend. Given Depomedrol 80 mg IM today to help with hoarseness.  History of vitamin D deficiency discovered in 2019 and is now on high-dose vitamin D weekly  Obesity and impaired glucose tolerance currently treated with semaglutide 0.5 mg once a week started October 28, 2021 by Cardiology  History of rheumatoid arthritis treated with Arava-followed by rheumatology  History of depression treated with Celexa.  History of pure hypercholesterolemia treated with low-dose Crestor  Plan: Continue diet and exercise efforts.  Ozempic may be helpful to her.  For health maintenance exam and fasting labs due here in March 2024.  We will notify her of fasting lab results today.  She is seeing ENT today and also will have follow-up with Dr. Johney Frame.

## 2021-11-20 NOTE — Progress Notes (Signed)
The patient comes in today requesting steroid injections in both her knees.  She has severe end-stage arthritis of both knees and does get injections about every 3 to 4 months.  They are getting where it does not really help much at all.  They only last about a month.  Her left knee hurts worse than the right.  She does have a BMI of almost 50.  We need her to be able to lose more weight before we can consider her for knee replacement surgery and she understands that as well.  She has had no acute change in her medical status.  She is in pain management now as well.  Of these have slight varus malalignment that is correctable with very painful throughout the arc of motion of both knees.  There is no significant effusion of either knee.  I did place a steroid injection in both knees per her request that she tolerated well.  She knows that we can only do these about 3 to 4 months apart.  Of note she is a diabetic she should watch her blood glucose closely but her hemoglobin A1c has always been at a good level.  Procedure Note  Patient: Mary Herman             Date of Birth: [birth year]           MRN: [id number]             Visit Date: 11/20/2021  Procedures: Visit Diagnoses:  1. Unilateral primary osteoarthritis, left knee   2. Unilateral primary osteoarthritis, right knee   3. Chronic pain of left knee   4. Chronic pain of right knee   5. Morbid (severe) obesity due to excess calories ([facility])     Large Joint Inj: R knee on 11/20/2021 12:54 PM Indications: diagnostic evaluation and pain Details: 22 G 1.5 in needle, superolateral approach  Arthrogram: No  Medications: 3 mL lidocaine 1 %; 40 mg methylPREDNISolone acetate 40 MG/ML Outcome: tolerated well, no immediate complications Procedure, treatment alternatives, risks and benefits explained, specific risks discussed. Consent was given by the patient. Immediately prior to procedure a time out was called to verify the correct patient,  procedure, equipment, support staff and site/side marked as required. Patient was prepped and draped in the usual sterile fashion.    Large Joint Inj: L knee on 11/20/2021 12:55 PM Indications: diagnostic evaluation and pain Details: 22 G 1.5 in needle, superolateral approach  Arthrogram: No  Medications: 3 mL lidocaine 1 %; 40 mg methylPREDNISolone acetate 40 MG/ML Outcome: tolerated well, no immediate complications Procedure, treatment alternatives, risks and benefits explained, specific risks discussed. Consent was given by the patient. Immediately prior to procedure a time out was called to verify the correct patient, procedure, equipment, support staff and site/side marked as required. Patient was prepped and draped in the usual sterile fashion.

## 2021-11-20 NOTE — Progress Notes (Signed)
Lab only 

## 2021-11-20 NOTE — Patient Instructions (Signed)
Fasting labs drawn and pending and results to follow.

## 2021-11-21 ENCOUNTER — Telehealth: Payer: Self-pay

## 2021-11-21 LAB — LIPID PANEL
Cholesterol: 142 mg/dL (ref <200)
HDL: 76 mg/dL (ref >=50)
LDL Cholesterol (Calc): 47 mg/dL (calc)
Non-HDL Cholesterol (Calc): 66 mg/dL (calc) (ref <130)
Total CHOL/HDL Ratio: 1.9 (calc) (ref <5.0)
Triglycerides: 102 mg/dL (ref <150)

## 2021-11-21 LAB — COMPLETE METABOLIC PANEL WITH GFR
AG Ratio: 1.5 (calc) (ref 1.0–2.5)
ALT: 23 U/L (ref 6–29)
AST: 25 U/L (ref 10–35)
Albumin: 4 g/dL (ref 3.6–5.1)
Alkaline phosphatase (APISO): 119 U/L (ref 37–153)
BUN: 10 mg/dL (ref 7–25)
CO2: 29 mmol/L (ref 20–32)
Calcium: 9.2 mg/dL (ref 8.6–10.4)
Chloride: 106 mmol/L (ref 98–110)
Creat: 0.9 mg/dL (ref 0.50–1.03)
Globulin: 2.7 g/dL (calc) (ref 1.9–3.7)
Glucose, Bld: 117 mg/dL — ABNORMAL HIGH (ref 65–99)
Potassium: 4.3 mmol/L (ref 3.5–5.3)
Sodium: 143 mmol/L (ref 135–146)
Total Bilirubin: 0.9 mg/dL (ref 0.2–1.2)
Total Protein: 6.7 g/dL (ref 6.1–8.1)
eGFR: 74 mL/min/{1.73_m2} (ref >=60)

## 2021-11-21 LAB — HEMOGLOBIN A1C
Hgb A1c MFr Bld: 5.5 % of total Hgb (ref <5.7)
Mean Plasma Glucose: 111 mg/dL
eAG (mmol/L): 6.2 mmol/L

## 2021-11-21 LAB — CBC WITH DIFFERENTIAL/PLATELET
Absolute Monocytes: 355 cells/uL (ref 200–950)
Basophils Absolute: 12 cells/uL (ref 0–200)
Basophils Relative: 0.3 %
Eosinophils Absolute: 172 cells/uL (ref 15–500)
Eosinophils Relative: 4.4 %
HCT: 44.6 % (ref 35.0–45.0)
Hemoglobin: 14.5 g/dL (ref 11.7–15.5)
Lymphs Abs: 1264 cells/uL (ref 850–3900)
MCH: 29.5 pg (ref 27.0–33.0)
MCHC: 32.5 g/dL (ref 32.0–36.0)
MCV: 90.8 fL (ref 80.0–100.0)
MPV: 11.3 fL (ref 7.5–12.5)
Monocytes Relative: 9.1 %
Neutro Abs: 2098 cells/uL (ref 1500–7800)
Neutrophils Relative %: 53.8 %
Platelets: 236 10*3/uL (ref 140–400)
RBC: 4.91 10*6/uL (ref 3.80–5.10)
RDW: 12.6 % (ref 11.0–15.0)
Total Lymphocyte: 32.4 %
WBC: 3.9 10*3/uL (ref 3.8–10.8)

## 2021-11-21 LAB — TSH: TSH: 1.22 mIU/L (ref 0.40–4.50)

## 2021-11-21 LAB — VITAMIN D 25 HYDROXY (VIT D DEFICIENCY, FRACTURES): Vit D, 25-Hydroxy: 111 ng/mL — ABNORMAL HIGH (ref 30–100)

## 2021-11-21 NOTE — Telephone Encounter (Signed)
Patient called back stating she has been taking OTC vitamin D 1000 units nightly along with Rx vitamin D

## 2021-11-24 ENCOUNTER — Ambulatory Visit: Payer: No Typology Code available for payment source | Attending: Internal Medicine | Admitting: Pharmacist

## 2021-11-24 NOTE — Progress Notes (Signed)
Patient ID: Mary Herman                 DOB: [birth year]                    MRN: [id number]     HPI: Mary Herman is a 61 y.o. female patient referred to pharmacy clinic by Dr. Johney Frame to initiate weight loss therapy with GLP1-RA. PMH is significant for obesity, hypothyroidism, RA, depression, and prior gastric bypass . Most recent BMI 49.  Patient started on Ozempic about 4 weeks ago. She presents today to clinic. Reports diarrhea on and off for the last several weeks. She has stopped drinking sweet tea. Has dentures and cannot eat hard foods. Husband is a picky eater. She works from home. Stopped Januvia when she started Ozempic. Only cooks once a week because both she and her husband cannot stand for long to cook. Eats out for dinner often.  Current weight management medications: Ozempic 0.'25mg'$  weekly  Previously tried meds:  Current meds that may affect weight:  Baseline weight/BMI: 281lb/49.78  Insurance payor: [employer]  Diet:  -Breakfast: toast w/ PB or frozen sausage egg muffin -Lunch: deli ham/cheese on sandwich or doesn't eat -Dinner: frozen meals, casserole once a week, take out (salad w/ chicken, chicken tenders, [employer], taco bell) -Snacks: pop-corn, 100 calorie ice cream bar -Drinks: sparking water  Exercise: none  Family History:  Family History  Problem Relation Age of Onset   Hypertension Mother    Stroke Mother    Hypertension Brother    Heart disease Maternal Uncle    Diabetes Maternal Grandmother    Cancer Maternal Grandmother        THROAT CA   Cancer Maternal Grandfather        THROAT CA     Social History: no tobacco, no alcohol or drugs  Labs: Lab Results  Component Value Date   HGBA1C 5.5 11/20/2021    Wt Readings from Last 1 Encounters:  11/20/21 281 lb (127.5 kg)    BP Readings from Last 1 Encounters:  11/20/21 116/84   Pulse Readings from Last 1 Encounters:  11/20/21 85       Component Value Date/Time   CHOL 142  11/20/2021 1016   TRIG 102 11/20/2021 1016   HDL 76 11/20/2021 1016   CHOLHDL 1.9 11/20/2021 1016   VLDL 22 02/07/2013 1211   [place] 47 11/20/2021 1016    Past Medical History:  Diagnosis Date   Anemia    Chronic back pain    Depression    Dyspnea    with activity   Environmental allergies    Family history of adverse reaction to anesthesia    daughter- n/v       Fibroid    GERD (gastroesophageal reflux disease)    Hematuria    History of cystitis    INTERSTITIAL CYSTITIS--  NO ISSUES FOR YRS   History of gastroesophageal reflux (GERD)    DENIES ISSUES SINCE GASTRIC BY PASS   History of hypertension    NO ISSUE SINCE WT LOSS AFTER GASTRIC BYPASS   History of kidney stones    History of obstructive sleep apnea    USED CPAP--  NO ISSUE SINCE WT LOSS AFTER GASTRIC BYPASS   Hypertension    Pneumonia    Pre-diabetes    pt. denies   Rheumatoid arthritis ([place])    Right ureteral stone    Sleep apnea    no cpap  Thyroid disease    Trochanteric bursitis of both hips     Current Outpatient Medications on File Prior to Visit  Medication Sig Dispense Refill   buPROPion (WELLBUTRIN XL) 300 MG 24 hr tablet TAKE 1 TABLET BY MOUTH EVERY DAY 90 tablet 3   celecoxib (CELEBREX) 200 MG capsule TAKE 1 CAPSULE BY MOUTH TWICE A DAY 180 capsule 1   Cholecalciferol (VITAMIN D) 50 MCG (2000 UT) tablet Take 2,000 Units by mouth at bedtime.     citalopram (CELEXA) 20 MG tablet TAKE 1 TABLET BY MOUTH EVERY DAY IN THE MORNING 90 tablet 1   clotrimazole-betamethasone (LOTRISONE) cream APPLY TO AFFECTED AREA TWICE A DAY 30 g prn   cyclobenzaprine (FLEXERIL) 10 MG tablet TAKE 1 TABLET BY MOUTH EVERYDAY AT BEDTIME 90 tablet 1   diazepam (VALIUM) 5 MG tablet TAKE 1 TABLET BY MOUTH EVERY DAY AT BEDTIME AS NEEDED 90 tablet 1   folic acid (FOLVITE) 1 MG tablet Take 1 mg by mouth daily.     HUMIRA PEN 40 MG/0.4ML PNKT SMARTSIG:40 Milligram(s) SUB-Q Every 2 Weeks     HYDROcodone-acetaminophen  (NORCO/VICODIN) 5-325 MG tablet Take 1 tablet by mouth every 6 (six) hours as needed for moderate pain. 30 tablet 0   leflunomide (ARAVA) 20 MG tablet Take 20 mg by mouth daily.     levothyroxine (SYNTHROID) 75 MCG tablet TAKE 1 TABLET BY MOUTH EVERY DAY 90 tablet 1   Melatonin 10 MG CAPS Take 10 mg by mouth at bedtime.     metoprolol succinate (TOPROL-XL) 25 MG 24 hr tablet TAKE 1 TABLET BY MOUTH EVERY DAY 90 tablet 3   montelukast (SINGULAIR) 10 MG tablet TAKE 1 TABLET BY MOUTH EVERYDAY AT BEDTIME 90 tablet 3   OVER THE COUNTER MEDICATION Take 1 tablet by mouth at bedtime. Bariatric Iron     rosuvastatin (CRESTOR) 5 MG tablet TAKE 1 TABLET BY MOUTH THREE TIMES A WEEK. 36 tablet 1   Semaglutide,0.25 or 0.'5MG'$ /DOS, 2 MG/3ML SOPN Inject 0.'25mg'$  once a week for 4 weeks and then increase to 0.'5mg'$  once a week. 3 mL 1   Vitamin D, Ergocalciferol, (DRISDOL) 1.25 MG (50000 UNIT) CAPS capsule TAKE 1 CAPSULE BY MOUTH ONE TIME PER WEEK 12 capsule 3   No current facility-administered medications on file prior to visit.    Allergies  Allergen Reactions   Codeine Rash    Tolerates hydrocodone    Methotrexate Rash     Assessment/Plan:  1. Weight loss - Patient has lost 4lb since starting Ozempic. She is not exercising. Legs are weak and cannot stand for long. I encouraged her to search chair yoga or seated banded exercises. Start small 70mn and work her way up. We reviewed the plate method. Focus on adding more vegetables in diet, eat whole foods and lean proteins. Hand outs provided. Patient did not appear very motivated. We discussed continuing ozmepic at 0.'25mg'$  and finding the trigger to her diarrhea (most likely fried foods) or switching to [facility] Suggested she keep a food diary. Will continue Ozempic for 2 more weeks and then I will follow up with patient.  Thank you,  MRamond Dial Pharm.D, BCPS, CPP [facility]  [place] [address] Phone: [phone] Fax: [phone]

## 2021-11-24 NOTE — Patient Instructions (Signed)
GLP-1 Receptor Agonist Counseling Points This medication reduces your appetite and may make you feel fuller longer.  Stop eating when your body tells you that you are full. This will likely happen sooner than you are used to. Store your medication in the fridge until you are ready to use it. Inject your medication in the fatty tissue of your lower abdominal area (2 inches away from belly button) or upper outer thigh. Rotate injection sites. Each pen will last you about 1 month (the first month it will last a few weeks longer). Use a different needle with each weekly injection. Common side effects include: nausea, diarrhea/constipation, and heartburn, and are more likely to occur if you overeat.  Continue ozempic 0.'25mg'$  weekly Check blood pressure at home. Goal is <130/80  Tips for living a healthier life     Building a Healthy and Balanced Diet Make most of your meal vegetables and fruits -  of your plate. Aim for color and variety, and remember that potatoes don't count as vegetables on the Healthy Eating Plate because of their negative impact on blood sugar.  Go for whole grains -  of your plate. Whole and intact grains--whole wheat, barley, wheat berries, quinoa, oats, brown rice, and foods made with them, such as whole wheat pasta--have a milder effect on blood sugar and insulin than white bread, white rice, and other refined grains.  Protein power -  of your plate. Fish, poultry, beans, and nuts are all healthy, versatile protein sources--they can be mixed into salads, and pair well with vegetables on a plate. Limit red meat, and avoid processed meats such as bacon and sausage.  Healthy plant oils - in moderation. Choose healthy vegetable oils like olive, canola, soy, corn, sunflower, peanut, and others, and avoid partially hydrogenated oils, which contain unhealthy trans fats. Remember that low-fat does not mean "healthy."  Drink water, coffee, or tea. Skip sugary drinks, limit  milk and dairy products to one to two servings per day, and limit juice to a small glass per day.  Stay active. The red figure running across the [place] is a reminder that staying active is also important in weight control.  The main message of the Healthy Eating Plate is to focus on diet quality:  The type of carbohydrate in the diet is more important than the amount of carbohydrate in the diet, because some sources of carbohydrate--like vegetables (other than potatoes), fruits, whole grains, and beans--are healthier than others. The Healthy Eating Plate also advises consumers to avoid sugary beverages, a major source of calories--usually with little nutritional value--in the American diet. The Healthy Eating Plate encourages consumers to use healthy oils, and it does not set a maximum on the percentage of calories people should get each day from healthy sources of fat. In this way, the Healthy Eating Plate recommends the opposite of the low-fat message promoted for decades by the USDA.  DeskDistributor.no  SUGAR  Sugar is a huge problem in the modern day diet. Sugar is a big contributor to heart disease, diabetes, high triglyceride levels, fatty liver disease and obesity. Sugar is hidden in almost all packaged foods/beverages. Added sugar is extra sugar that is added beyond what is naturally found and has no nutritional benefit for your body. The American Heart Association recommends limiting added sugars to no more than 25g for women and 36 grams for men per day. There are many names for sugar including maltose, sucrose (names ending in "ose"), high fructose corn  syrup, molasses, cane sugar, corn sweetener, raw sugar, syrup, honey or fruit juice concentrate.   One of the best ways to limit your added sugars is to stop drinking sweetened beverages such as soda, sweet tea, and fruit juice.  There is 65g of added sugars in  one 20oz bottle of Coke! That is equal to 7.5 donuts.   Pay attention and read all nutrition facts labels. Below is an examples of a nutrition facts label. The #1 is showing you the total sugars where the # 2 is showing you the added sugars. This one serving has almost the max amount of added sugars per day!     20 oz Soda 65g Sugar = 7.5 Glazed Donuts  16oz Energy  Drink 54g Sugar = 6.5 Glazed Donuts  Large Sweet  Tea 38g Sugar = 4 Glazed Donuts  20oz Sports  Drink 34g Sugar = 3.5 Glazed Donuts  8oz Chocolate Milk 24g Sugar =2.5 Glazed Donuts  8oz Orange  Juice 21g Sugar = 2 Glazed Donuts  1 Juice Box 14g Sugar = 1.5 Glazed Donuts  16oz Water= NO SUGAR!!  EXERCISE  Exercise is good. We've all heard that. In an ideal world, we would all have time and resources to get plenty of it. When you are active, your heart pumps more efficiently and you will feel better.  Multiple studies show that even walking regularly has benefits that include living a longer life. The American Heart Association recommends 150 minutes per week of exercise (30 minutes per day most days of the week). You can do this in any increment you wish. Nine or more 10-minute walks count. So does an hour-long exercise class. Break the time apart into what will work in your life. Some of the best things you can do include walking briskly, jogging, cycling or swimming laps. Not everyone is ready to "exercise." Sometimes we need to start with just getting active. Here are some easy ways to be more active throughout the day:  Take the stairs instead of the elevator  Go for a 10-15 minute walk during your lunch break (find a friend to make it more enjoyable)  When shopping, park at the back of the parking lot  If you take public transportation, get off one stop early and walk the extra distance  Pace around while making phone calls  Check with your doctor if you aren't sure what your limitations may be. Always  remember to drink plenty of water when doing any type of exercise. Don't feel like a failure if you're not getting the 90-150 minutes per week. If you started by being a couch potato, then just a 10-minute walk each day is a huge improvement. Start with little victories and work your way up.   HEALTHY EATING TIPS  When looking to improve your eating habits, whether to lose weight, lower blood pressure or just be healthier, it helps to know what a serving size is.   Grains 1 slice of bread,  bagel,  cup pasta or rice  Vegetables 1 cup fresh or raw vegetables,  cup cooked or canned Fruits 1 piece of medium sized fruit,  cup canned,   Meats/Proteins  cup dried       1 oz meat, 1 egg,  cup cooked beans, nuts or seeds  Dairy        Fats Individual yogurt container, 1 cup (8oz)    1 teaspoon margarine/butter or vegetable  milk or milk alternative, 1 slice of cheese  oil; 1 tablespoon mayonnaise or salad dressing                  Plan ahead: make a menu of the meals for a week then create a grocery list to go with that menu. Consider meals that easily stretch into a night of leftovers, such as stews or casseroles. Or consider making two of your favorite meal and put one in the freezer for another night. Try a night or two each week that is "meatless" or "no cook" such as salads. When you get home from the grocery store wash and prepare your vegetables and fruits. Then when you need them they are ready to go.   Tips for going to the grocery store:  [place] store or generic brands  Check the weekly ad from your store on-line or in their in-store flyer  Look at the unit price on the shelf tag to compare/contrast the costs of different items  Buy fruits/vegetables in season  Carrots, bananas and apples are low-cost, naturally healthy items  If meats or frozen vegetables are on sale, buy some extras and put in your freezer  Limit buying prepared or "ready to eat" items, even if they are  pre-made salads or fruit snacks  Do not shop when you're hungry  Foods at eye level tend to be more expensive. Look on the high and low shelves for deals.  Consider shopping at the farmer's market for fresh foods in season.  Avoid the cookie and chip aisles (these are expensive, high in calories and low in nutritional value). Shop on the outside of the grocery store.  Healthy food preparations:  If you can't get lean hamburger, be sure to drain the fat when cooking  Steam, saut (in olive oil), grill or bake foods  Experiment with different seasonings to avoid adding salt to your foods. Kosher salt, sea salt and Himalayan salt are all still salt and should be avoided. Try seasoning food with onion, garlic, thyme, rosemary, basil ect. Onion powder or garlic powder is ok. Avoid if it says salt (ie garlic salt).

## 2021-12-01 DIAGNOSIS — M3500 Sicca syndrome, unspecified: Secondary | ICD-10-CM | POA: Insufficient documentation

## 2021-12-01 DIAGNOSIS — I10 Essential (primary) hypertension: Secondary | ICD-10-CM | POA: Insufficient documentation

## 2021-12-14 ENCOUNTER — Other Ambulatory Visit: Payer: Self-pay | Admitting: Internal Medicine

## 2021-12-15 ENCOUNTER — Telehealth: Payer: Self-pay | Admitting: Pharmacist

## 2021-12-15 NOTE — Telephone Encounter (Addendum)
Pt reports improved symptoms since last visit on 10/2. She reports occasional GI symptoms, but attributes them to her gastric bypass surgery. Pt's most recent weight today is 270 lbs. Pt is unsure whether or not she would like to increase to Ozempic 0.5 mg weekly, since she is feeling much better. She denies any increases in exercise given her knee pain, but reports a healthier diet. She requested waiting until her dose next week to decide whether or not to increase the dose. Patient takes Ozempic on Sunday. She will call on Monday to inform the clinic whether or not she is comfortable increasing Ozempic to 0.5 mg weekly.

## 2021-12-15 NOTE — Telephone Encounter (Signed)
Pt reports improved symptoms since last visit on 10/2. She reports occasional GI symptoms, but attributes them to her gastric bypass surgery. Pt's most recent weight today is 270 lbs. Pt is unsure whether or not she would like to increase to Ozempic 0.5 mg weekly, since she is feeling much better. She denies any increases in exercise given her knee pain, but reports a healthier diet. She requested waiting until her dose next week to decide whether or not to increase the dose. Patient takes Ozempic on Sunday. She will call on Monday to inform the clinic whether or not she is comfortable increasing Ozempic to 0.5 mg weekly.

## 2021-12-21 ENCOUNTER — Other Ambulatory Visit (HOSPITAL_COMMUNITY): Payer: Self-pay | Admitting: *Deleted

## 2021-12-22 ENCOUNTER — Telehealth (HOSPITAL_COMMUNITY): Payer: Self-pay | Admitting: *Deleted

## 2021-12-22 ENCOUNTER — Telehealth (HOSPITAL_COMMUNITY): Payer: Self-pay | Admitting: Emergency Medicine

## 2021-12-22 NOTE — Telephone Encounter (Signed)
Reaching out to patient to offer assistance regarding upcoming cardiac imaging study; pt verbalizes understanding of appt date/time, parking situation and where to check in, pre-test NPO status and medications ordered, and verified current allergies; name and call back number provided for further questions should they arise Mary Bond RN Navigator Cardiac Imaging [facility] [phone] office [phone] cell    Arrival 1230 [facility] main entrance Denies iv issues [place]- taking PRN valium, aware needs driver Holding metoprolol succinate

## 2021-12-22 NOTE — Telephone Encounter (Signed)
Attempted to call patient regarding upcoming cardiac PET appointment. Left message on voicemail with name and callback number  Audra Bellard RN Navigator Cardiac Imaging [facility] [phone] Office [phone] Cell  Reminder for patient to avoid caffeine 12 hours prior to her cardiac PET scan. 

## 2021-12-23 ENCOUNTER — Encounter (HOSPITAL_COMMUNITY)
Admission: RE | Admit: 2021-12-23 | Discharge: 2021-12-23 | Disposition: A | Discharge status: Home / Self Care | Payer: No Typology Code available for payment source | Source: Ambulatory Visit | Attending: Cardiology | Admitting: Cardiology

## 2021-12-23 DIAGNOSIS — R9431 Abnormal electrocardiogram [ECG] [EKG]: Secondary | ICD-10-CM | POA: Insufficient documentation

## 2021-12-23 MED ORDER — REGADENOSON 0.4 MG/5ML IV SOLN: 0.4000 mg | Freq: Once | INTRAVENOUS | Status: AC

## 2021-12-23 MED ORDER — REGADENOSON 0.4 MG/5ML IV SOLN
INTRAVENOUS | Status: AC
  Administered 2021-12-23: 0.4 mg via INTRAVENOUS
  Filled 2021-12-23: qty 5

## 2021-12-23 MED ORDER — RUBIDIUM RB82 GENERATOR (RUBYFILL)
30.0000 | PACK | Freq: Once | INTRAVENOUS | Status: AC
  Administered 2021-12-23: 30 via INTRAVENOUS

## 2021-12-24 LAB — NM PET CT CARDIAC PERFUSION MULTI W/ABSOLUTE BLOODFLOW
MBFR: 2.77
Nuc Rest EF: 66 %
Nuc Stress EF: 73 %
Rest MBF: 1.47 ml/g/min
Rest Nuclear Isotope Dose: 30 mCi
ST Depression (mm): 0 mm
Stress MBF: 4.07 ml/g/min
Stress Nuclear Isotope Dose: 30 mCi

## 2021-12-29 MED ORDER — SEMAGLUTIDE(0.25 OR 0.5MG/DOS) 2 MG/3ML ~~LOC~~ SOPN: 0.5000 mg | PEN_INJECTOR | SUBCUTANEOUS | 0 refills | Status: DC

## 2021-12-29 NOTE — Addendum Note (Signed)
Addended by: Marcelle Overlie D on: 12/29/2021 01:28 PM   Modules accepted: Orders

## 2021-12-29 NOTE — Telephone Encounter (Signed)
Patient called stating she decided to increase ozempic to 0.'5mg'$  weekly. She took her first dose yesterday. I have asked her to call me in 3 weeks to let me know how she is doing and we can discuss at that time if we will increase or stay. Refill sent.

## 2022-01-19 NOTE — Progress Notes (Unsigned)
Name: Mary Herman  MRN/ DOB: [id number], July 25, 1960   Age/ Sex: 61 y.o., female    PCP: Elby Showers, MD   Reason for Endocrinology Evaluation: Type 2 Diabetes Mellitus     Date of Initial Endocrinology Visit: 01/19/2022     PATIENT IDENTIFIER: Mary Herman is a 61 y.o. female with a past medical history of GERD, RA. The patient presented for initial endocrinology clinic visit on 01/19/2022 for consultative assistance with her diabetes management.    HPI: Ms. Capri was    Diagnosed with DM *** Prior Medications tried/Intolerance: *** Currently checking blood sugars *** x / day,  before breakfast and ***.  Hypoglycemia episodes : ***               Symptoms: ***                 Frequency: ***/  Hemoglobin A1c has ranged from *** in ***, peaking at *** in ***. Patient required assistance for hypoglycemia:  Patient has required hospitalization within the last 1 year from hyper or hypoglycemia:   In terms of diet, the patient ***   HOME DIABETES REGIMEN: Basal: ***  Bolus: ***   Statin: {Yes/No:11203} ACE-I/ARB: {YES/NO:17245} Prior Diabetic Education: {Yes/No:11203}   METER DOWNLOAD SUMMARY: Date range evaluated: *** Fingerstick Blood Glucose Tests = *** Average Number Tests/Day = *** Overall Mean FS Glucose = *** Standard Deviation = ***  BG Ranges: Low = *** High = ***   Hypoglycemic Events/30 Days: BG < 50 = *** Episodes of symptomatic severe hypoglycemia = ***   DIABETIC COMPLICATIONS: Microvascular complications:  *** Denies: *** Last eye exam: Completed   Macrovascular complications:  *** Denies: CAD, PVD, CVA   PAST HISTORY: Past Medical History:  Past Medical History:  Diagnosis Date   Anemia    Chronic back pain    Depression    Dyspnea    with activity   Environmental allergies    Family history of adverse reaction to anesthesia    daughter- n/v       Fibroid    GERD (gastroesophageal reflux disease)    Hematuria     History of cystitis    INTERSTITIAL CYSTITIS--  NO ISSUES FOR YRS   History of gastroesophageal reflux (GERD)    DENIES ISSUES SINCE GASTRIC BY PASS   History of hypertension    NO ISSUE SINCE WT LOSS AFTER GASTRIC BYPASS   History of kidney stones    History of obstructive sleep apnea    USED CPAP--  NO ISSUE SINCE WT LOSS AFTER GASTRIC BYPASS   Hypertension    Pneumonia    Pre-diabetes    pt. denies   Rheumatoid arthritis ([place])    Right ureteral stone    Sleep apnea    no cpap    Thyroid disease    Trochanteric bursitis of both hips    Past Surgical History:  Past Surgical History:  Procedure Laterality Date   CARDIAC CATHETERIZATION  12-07-2006  DR [facility]   NORMAL CORONARIES ARTERIES/ NORMAL LVF/ MILD INCREASED END-DIASTOLIC PRESSURE   CESAREAN SECTION  1998   CHOLECYSTECTOMY  1980'S   CYSTOSCOPY WITH RETROGRADE PYELOGRAM, URETEROSCOPY AND STENT PLACEMENT Bilateral 12/26/2012   Procedure: cystoscopy with bilateral retrogrades, bilateral ureteroscopy ;  Surgeon: Bernestine Amass, MD;  Location: [facility];  Service: Urology;  Laterality: Bilateral;   CYSTOSCOPY WITH STENT PLACEMENT Bilateral 12/26/2012   Procedure: CYSTOSCOPY WITH STENT PLACEMENT;  Surgeon: Camelia Eng  Risa Grill, MD;  Location: [facility];  Service: Urology;  Laterality: Bilateral;   CYSTOSCOPY/ HYDRODISTENTION  YRS AGO   DILATATION & CURETTAGE/HYSTEROSCOPY WITH MYOSURE N/A 11/25/2018   Procedure: DILATATION & CURETTAGE/HYSTEROSCOPY WITH MYOSURE;  Surgeon: Anastasio Auerbach, MD;  Location: [place];  Service: Gynecology;  Laterality: N/A;   HERNIA REPAIR     HOLMIUM LASER APPLICATION Right 42/35/3614   Procedure: HOLMIUM LASER APPLICATION;  Surgeon: Bernestine Amass, MD;  Location: [facility];  Service: Urology;  Laterality: Right;   KNEE ARTHROSCOPY Left 1992   ROBOTIC ASSISTED TOTAL HYSTERECTOMY WITH BILATERAL SALPINGO OOPHERECTOMY N/A 01/31/2019   Procedure: XI  ROBOTIC ASSISTED TOTAL HYSTERECTOMY WITH BILATERAL SALPINGO OOPHORECTOMY;  Surgeon: Everitt Amber, MD;  Location: [facility];  Service: Gynecology;  Laterality: N/A;   ROUX-EN-Y PROCEDURE  07/25/2007   SHOULDER ARTHROSCOPY Right 2011   TMJ ARTHROPLASTY  1990'S   BILATERAL UPPER   TOTAL ABDOMINAL HYSTERECTOMY      Social History:  reports that she has never smoked. She has never used smokeless tobacco. She reports that she does not drink alcohol and does not use drugs. Family History:  Family History  Problem Relation Age of Onset   Hypertension Mother    Stroke Mother    Hypertension Brother    Heart disease Maternal Uncle    Diabetes Maternal Grandmother    Cancer Maternal Grandmother        THROAT CA   Cancer Maternal Grandfather        THROAT CA     HOME MEDICATIONS: Allergies as of 01/20/2022       Reactions   Codeine Rash   Tolerates hydrocodone    Methotrexate Rash        Medication List        Accurate as of January 19, 2022 12:02 PM. If you have any questions, ask your nurse or doctor.          buPROPion 300 MG 24 hr tablet Commonly known as: WELLBUTRIN XL TAKE 1 TABLET BY MOUTH EVERY DAY   celecoxib 200 MG capsule Commonly known as: CELEBREX TAKE 1 CAPSULE BY MOUTH TWICE A DAY   citalopram 20 MG tablet Commonly known as: CELEXA TAKE 1 TABLET BY MOUTH EVERY DAY IN THE MORNING   clotrimazole-betamethasone cream Commonly known as: LOTRISONE APPLY TO AFFECTED AREA TWICE A DAY   cyclobenzaprine 10 MG tablet Commonly known as: FLEXERIL TAKE 1 TABLET BY MOUTH EVERYDAY AT BEDTIME   diazepam 5 MG tablet Commonly known as: VALIUM TAKE 1 TABLET BY MOUTH EVERY DAY AT BEDTIME AS NEEDED   folic acid 1 MG tablet Commonly known as: FOLVITE Take 1 mg by mouth daily.   Humira Pen 40 MG/0.4ML Pnkt Generic drug: Adalimumab SMARTSIG:40 Milligram(s) SUB-Q Every 2 Weeks   HYDROcodone-acetaminophen 5-325 MG tablet Commonly known as: NORCO/VICODIN Take 1  tablet by mouth every 6 (six) hours as needed for moderate pain.   leflunomide 20 MG tablet Commonly known as: ARAVA Take 20 mg by mouth daily.   levothyroxine 75 MCG tablet Commonly known as: SYNTHROID TAKE 1 TABLET BY MOUTH EVERY DAY   Melatonin 10 MG Caps Take 10 mg by mouth at bedtime.   metoprolol succinate 25 MG 24 hr tablet Commonly known as: TOPROL-XL TAKE 1 TABLET BY MOUTH EVERY DAY   montelukast 10 MG tablet Commonly known as: SINGULAIR TAKE 1 TABLET BY MOUTH EVERYDAY AT BEDTIME   OVER THE COUNTER MEDICATION Take 1 tablet by mouth  at bedtime. Bariatric Iron   rosuvastatin 5 MG tablet Commonly known as: CRESTOR TAKE 1 TABLET BY MOUTH THREE TIMES A WEEK.   Semaglutide(0.25 or 0.'5MG'$ /DOS) 2 MG/3ML Sopn Inject 0.5 mg into the skin once a week. Inject 0.'25mg'$  once a week for 4 weeks and then increase to 0.'5mg'$  once a week.   Vitamin D (Ergocalciferol) 1.25 MG (50000 UNIT) Caps capsule Commonly known as: DRISDOL TAKE 1 CAPSULE BY MOUTH ONE TIME PER WEEK   Vitamin D 50 MCG (2000 UT) tablet Take 2,000 Units by mouth at bedtime.         ALLERGIES: Allergies  Allergen Reactions   Codeine Rash    Tolerates hydrocodone    Methotrexate Rash     REVIEW OF SYSTEMS: A comprehensive ROS was conducted with the patient and is negative except as per HPI and below:  ROS    OBJECTIVE:   VITAL SIGNS: LMP 05/25/2014    PHYSICAL EXAM:  General: Pt appears well and is in NAD  Neck: General: Supple without adenopathy or carotid bruits. Thyroid: Thyroid size normal.  No goiter or nodules appreciated.   Lungs: Clear with good BS bilat with no rales, rhonchi, or wheezes  Heart: RRR   Abdomen:  soft, nontender  Extremities:  Lower extremities - No pretibial edema. No lesions.  Skin: Normal texture and temperature to palpation. No rash noted.  Neuro: MS is good with appropriate affect, pt is alert and Ox3    DM foot exam:    DATA REVIEWED:  Lab Results   Component Value Date   HGBA1C 5.5 11/20/2021   HGBA1C 5.9 (H) 07/07/2021   HGBA1C 5.9 (H) 05/13/2021   Lab Results  Component Value Date   MICROALBUR 11.8 07/07/2021   LDLCALC 47 11/20/2021   CREATININE 0.90 11/20/2021   Lab Results  Component Value Date   MICRALBCREAT 8 05/10/2020    Lab Results  Component Value Date   CHOL 142 11/20/2021   HDL 76 11/20/2021   LDLCALC 47 11/20/2021   TRIG 102 11/20/2021   CHOLHDL 1.9 11/20/2021        ASSESSMENT / PLAN / RECOMMENDATIONS:   1) Type *** Diabetes Mellitus, ***controlled, With*** complications - Most recent A1c of *** %. Goal A1c < *** %.  ***  Plan: GENERAL: ***  MEDICATIONS: ***  EDUCATION / INSTRUCTIONS: BG monitoring instructions: Patient is instructed to check her blood sugars *** times a day, ***. Call [facility] if: BG persistently < 70  I reviewed the Rule of 15 for the treatment of hypoglycemia in detail with the patient. Literature supplied.   2) Diabetic complications:  Eye: Does *** have known diabetic retinopathy.  Neuro/ Feet: Does *** have known diabetic peripheral neuropathy. Renal: Patient does *** have known baseline CKD. She is *** on an ACEI/ARB at present.  3) Lipids: Patient is *** on a statin.    4) Hypertension: ***  at goal of < 140/90 mmHg.       Signed electronically by: Mack Guise, MD  [facility]  [facility] [address] [place], [place] [address] Phone: [phone] FAX: [phone]   CC: Elby Showers, MD [id number] [id number] Phone: [phone]  Fax: [phone]    Return to Endocrinology clinic as below: Future Appointments  Date Time Provider Chambers  01/20/2022  7:30 AM Mahlani Berninger, Melanie Crazier, MD [facility] None  02/25/2022 10:15 AM Mcarthur Rossetti, MD [facility] None  04/27/2022  10:00 AM Freada Bergeron, MD [facility]   05/15/2022 10:00 AM Baxley, Cresenciano Lick, MD [facility]  05/15/2022 10:30 AM [facility]

## 2022-01-20 ENCOUNTER — Ambulatory Visit: Payer: No Typology Code available for payment source | Admitting: Internal Medicine

## 2022-01-20 ENCOUNTER — Encounter: Payer: Self-pay | Admitting: Internal Medicine

## 2022-01-20 VITALS — BP 130/72 | HR 102 | Ht 63.0 in | Wt 263.0 lb

## 2022-01-20 DIAGNOSIS — R7303 Prediabetes: Secondary | ICD-10-CM

## 2022-01-20 LAB — POCT GLYCOSYLATED HEMOGLOBIN (HGB A1C): Hemoglobin A1C: 5.6 % (ref 4.0–5.6)

## 2022-01-26 DIAGNOSIS — J320 Chronic maxillary sinusitis: Secondary | ICD-10-CM | POA: Insufficient documentation

## 2022-02-04 ENCOUNTER — Telehealth: Payer: Self-pay | Admitting: Pharmacist

## 2022-02-04 MED ORDER — SEMAGLUTIDE (1 MG/DOSE) 4 MG/3ML ~~LOC~~ SOPN: 1.0000 mg | PEN_INJECTOR | SUBCUTANEOUS | 1 refills | Status: DC

## 2022-02-04 NOTE — Telephone Encounter (Signed)
Patient would like to increase to ozempic '1mg'$ . Will send rx to [facility]. Follow up in 4 weeks.

## 2022-02-20 ENCOUNTER — Other Ambulatory Visit: Payer: Self-pay | Admitting: Internal Medicine

## 2022-02-20 DIAGNOSIS — E559 Vitamin D deficiency, unspecified: Secondary | ICD-10-CM

## 2022-02-25 ENCOUNTER — Ambulatory Visit: Payer: No Typology Code available for payment source | Admitting: Orthopaedic Surgery

## 2022-02-26 ENCOUNTER — Other Ambulatory Visit: Payer: Self-pay | Admitting: Internal Medicine

## 2022-03-13 NOTE — Telephone Encounter (Signed)
Called patient to follow-up on how she is doing on Ozempic.  Patient states she is doing well no nausea diarrhea.  She just got a refill would like to stay on the 1 mg for at least another month.  Her [facility] forces [facility] to [facility], we talked about how there are some supply issues.  She has made some changes in her diet, not eating as much fast food, trying to cook more at home, snacking less.  She plans to look into some chair exercises soon as she knows that she needs to start doing them. I will follow-up with the patient in another 3 weeks.

## 2022-03-18 ENCOUNTER — Ambulatory Visit: Payer: No Typology Code available for payment source | Admitting: Orthopaedic Surgery

## 2022-03-18 ENCOUNTER — Ambulatory Visit: Payer: No Typology Code available for payment source | Admitting: Registered"

## 2022-03-30 MED ORDER — OZEMPIC (2 MG/DOSE) 8 MG/3ML ~~LOC~~ SOPN: 2.0000 mg | PEN_INJECTOR | SUBCUTANEOUS | 11 refills | Status: DC

## 2022-03-30 NOTE — Telephone Encounter (Signed)
Called pt to follow up on Ozmepic and lifestyle changes. She reports she is doing well on Ozempic '1mg'$  and is willing to increase to '2mg'$ . No N/V/D. Has been trying to walk more. Usually needs a scooter in a store but the other day she walked 15-20 min a store which is really good for her. She has not started chair exercises but is planning on it. Eating less, less take out and trying to encourage her husband to eat less at night. Congratulated patient on her success and encouraged her to start chair exercising and get up every hour and move around. New Rx sent in. Will f/u in 3 weeks

## 2022-03-30 NOTE — Addendum Note (Signed)
Addended by: Marcelle Overlie D on: 03/30/2022 09:01 AM   Modules accepted: Orders

## 2022-04-01 ENCOUNTER — Other Ambulatory Visit: Payer: Self-pay | Admitting: Internal Medicine

## 2022-04-01 DIAGNOSIS — R8290 Unspecified abnormal findings in urine: Secondary | ICD-10-CM

## 2022-04-02 DIAGNOSIS — R52 Pain, unspecified: Secondary | ICD-10-CM | POA: Insufficient documentation

## 2022-04-16 NOTE — Progress Notes (Deleted)
Cardiology Office Note:    Date:  04/16/2022   ID:  Mary Herman, DOB September 16, 1960, MRN [id number]  PCP:  Elby Showers, MD   [place] Providers Cardiologist:  None {   Referring MD: Elby Showers, MD    History of Present Illness:    Mary Herman is a 62 y.o. female with a hx of hypothyroidism, RA, depression, and prior gastric bypass who was referred by Dr. Renold Genta for further evaluation of abnormal ECG.  On 08/20/2021 her ECG showed sinus tachycardia, low voltage in precordial leads, old inferior infarct.  Today, the patient states that she overall feels okay. She has chronic low energy and is DOE which is stable. She mostly attributes her symptoms to her weight and deconditioning.  She notes that she was diagnosed with diabetes about 3 months ago. She was started on Januvia. Since her diagnosis she has stopped drinking sweet tea and has noticed some weight loss. She admits that her diet is not the best as she does not enjoy cooking. Frequently she may have frozen dinners such as lasagna or macaroni and beef. Previously she underwent bariatric surgery and had lost 115 lbs. She is trying to work on weight loss again. ments.   In her family, her mother had a brain aneurysm. She has a great uncle who had heart issues. One of her siblings has been on antihypertensives for a long time. No other known family history of heart disease.  She denies any palpitations, chest pain, or peripheral edema. No headaches, syncope, orthopnea, or PND.   Past Medical History:  Diagnosis Date   Anemia    Chronic back pain    Depression    Dyspnea    with activity   Environmental allergies    Family history of adverse reaction to anesthesia    daughter- n/v       Fibroid    GERD (gastroesophageal reflux disease)    Hematuria    History of cystitis    INTERSTITIAL CYSTITIS--  NO ISSUES FOR YRS   History of gastroesophageal reflux (GERD)    DENIES ISSUES SINCE GASTRIC BY PASS    History of hypertension    NO ISSUE SINCE WT LOSS AFTER GASTRIC BYPASS   History of kidney stones    History of obstructive sleep apnea    USED CPAP--  NO ISSUE SINCE WT LOSS AFTER GASTRIC BYPASS   Hypertension    Pneumonia    Pre-diabetes    pt. denies   Rheumatoid arthritis ([place])    Right ureteral stone    Sleep apnea    no cpap    Thyroid disease    Trochanteric bursitis of both hips     Past Surgical History:  Procedure Laterality Date   CARDIAC CATHETERIZATION  12-07-2006  DR [facility]   NORMAL CORONARIES ARTERIES/ NORMAL LVF/ MILD INCREASED END-DIASTOLIC PRESSURE   CESAREAN SECTION  1998   CHOLECYSTECTOMY  1980'S   CYSTOSCOPY WITH RETROGRADE PYELOGRAM, URETEROSCOPY AND STENT PLACEMENT Bilateral 12/26/2012   Procedure: cystoscopy with bilateral retrogrades, bilateral ureteroscopy ;  Surgeon: Bernestine Amass, MD;  Location: [facility];  Service: Urology;  Laterality: Bilateral;   CYSTOSCOPY WITH STENT PLACEMENT Bilateral 12/26/2012   Procedure: CYSTOSCOPY WITH STENT PLACEMENT;  Surgeon: Bernestine Amass, MD;  Location: [facility];  Service: Urology;  Laterality: Bilateral;   CYSTOSCOPY/ HYDRODISTENTION  YRS AGO   DILATATION & CURETTAGE/HYSTEROSCOPY WITH MYOSURE N/A 11/25/2018   Procedure: DILATATION &  CURETTAGE/HYSTEROSCOPY WITH MYOSURE;  Surgeon: Anastasio Auerbach, MD;  Location: [place];  Service: Gynecology;  Laterality: N/A;   HERNIA REPAIR     HOLMIUM LASER APPLICATION Right 0000000   Procedure: HOLMIUM LASER APPLICATION;  Surgeon: Bernestine Amass, MD;  Location: [facility];  Service: Urology;  Laterality: Right;   KNEE ARTHROSCOPY Left 1992   ROBOTIC ASSISTED TOTAL HYSTERECTOMY WITH BILATERAL SALPINGO OOPHERECTOMY N/A 01/31/2019   Procedure: XI ROBOTIC ASSISTED TOTAL HYSTERECTOMY WITH BILATERAL SALPINGO OOPHORECTOMY;  Surgeon: Everitt Amber, MD;  Location[facility];  Service: Gynecology;  Laterality: N/A;   ROUX-EN-Y PROCEDURE   07/25/2007   SHOULDER ARTHROSCOPY Right 2011   TMJ ARTHROPLASTY  1990'S   BILATERAL UPPER   TOTAL ABDOMINAL HYSTERECTOMY      Current Medications: No outpatient medications have been marked as taking for the 04/27/22 encounter (Appointment) with Mary Bergeron, MD.     Allergies:   Codeine and Methotrexate   Social History   Socioeconomic History   Marital status: Married    Spouse name: Not on file   Number of children: Not on file   Years of education: Not on file   Highest education level: Not on file  Occupational History   Not on file  Tobacco Use   Smoking status: Never   Smokeless tobacco: Never  Vaping Use   Vaping Use: Never used  Substance and Sexual Activity   Alcohol use: No   Drug use: No   Sexual activity: Not Currently    Partners: Male    Birth control/protection: Surgical    Comment: husband vasectomy-1st intercourse 26 yo-Fewer than 5 partners  Other Topics Concern   Not on file  Social History Narrative   Not on file   Social Determinants of Health   Financial Resource Strain: Not on file  Food Insecurity: Not on file  Transportation Needs: Not on file  Physical Activity: Not on file  Stress: Not on file  Social Connections: Not on file     Family History: The patient's family history includes Cancer in her maternal grandfather and maternal grandmother; Diabetes in her maternal grandmother; Heart disease in her maternal uncle; Hypertension in her brother and mother; Stroke in her mother.  ROS:   Review of Systems  Constitutional:  Positive for malaise/fatigue. Negative for chills and fever.  HENT:  Negative for nosebleeds and tinnitus.   Eyes:  Negative for double vision and discharge.  Respiratory:  Positive for shortness of breath. Negative for cough and hemoptysis.   Cardiovascular:  Negative for chest pain, palpitations, orthopnea, claudication, leg swelling and PND.  Gastrointestinal:  Negative for constipation and vomiting.   Genitourinary:  Negative for dysuria.  Musculoskeletal:  Positive for joint pain. Negative for falls.  Neurological:  Positive for dizziness. Negative for loss of consciousness and headaches.  Psychiatric/Behavioral:  Negative for hallucinations and substance abuse.      EKGs/Labs/Other Studies Reviewed:    The following studies were reviewed today:  2D Echo  11/03/2011: Findings: LVEF 65-70% Mild concentric left ventricular hypertrophy. The aortic valve is trileaflet. No aortic stenosis or regurgitation. Mild mitral regurgitation. Trace tricuspid regurgitation. Right ventricular systolic pressure is 123XX123 mm Hg.   EKG:  EKG is personally reviewed. 10/22/2021:  Sinus tachycardia. Rate 107 bpm. LAD. Inferior infarct. Anterior infarct.   Recent Labs: 11/20/2021: ALT 23; BUN 10; Creat 0.90; Hemoglobin 14.5; Platelets 236; Potassium 4.3; Sodium 143; TSH 1.22   Recent Lipid Panel    Component Value  Date/Time   CHOL 142 11/20/2021 1016   TRIG 102 11/20/2021 1016   HDL 76 11/20/2021 1016   CHOLHDL 1.9 11/20/2021 1016   VLDL 22 02/07/2013 1211   LDLCALC 47 11/20/2021 1016     Risk Assessment/Calculations:      No BP recorded.  {Refresh Note OR Click here to enter BP  :1}***         Physical Exam:    VS:  LMP 05/25/2014     Wt Readings from Last 3 Encounters:  01/20/22 263 lb (119.3 kg)  11/24/21 277 lb 6.4 oz (125.8 kg)  11/20/21 281 lb (127.5 kg)     GEN: Well nourished, well developed in no acute distress HEENT: Normal NECK: No JVD; No carotid bruits CARDIAC: RRR, no murmurs, rubs, gallops RESPIRATORY:  Clear to auscultation without rales, wheezing or rhonchi  ABDOMEN: Morbidly obese, NTTP MUSCULOSKELETAL:  No edema; No deformity  SKIN: Warm and dry NEUROLOGIC:  Alert and oriented x 3 PSYCHIATRIC:  Normal affect   ASSESSMENT:    No diagnosis found.  PLAN:    In order of problems listed above:  #Abnormal ECG: Patient with ECG with anterior and  inferior q-waves. Has chronic dyspnea on exertion and fatigue which she relates to obesity. No known history of CAD but given risk factors and abnormal ECG, will check TTE and NM PET for further evaluation. -Check TTE -Check NM PET scan  #HLD: -Continue crestor 34m daily -LDL well controlled at 63  #HTN: Well controlled. -Continue metop 210mXL daily  #Morbid Obesity:  BMI 50. Suspect this is contributing to DOE and chronic fatigue. Had gastric bypass in the past but gained weight back. Will look into coverage for GLP-1 agonist. Discussed dietary modifications as well today. -Check into coverage for GLP-1 agonist      Shared Decision Making/Informed Consent The risks [chest pain, shortness of breath, cardiac arrhythmias, dizziness, blood pressure fluctuations, myocardial infarction, stroke/transient ischemic attack, nausea, vomiting, allergic reaction, radiation exposure, metallic taste sensation and life-threatening complications (estimated to be 1 in 10,000)], benefits (risk stratification, diagnosing coronary artery disease, treatment guidance) and alternatives of a cardiac PET stress test were discussed in detail with Ms. MaZigmund Danielnd she agrees to proceed.  Follow-up:  6 months.  Medication Adjustments/Labs and Tests Ordered: Current medicines are reviewed at length with the patient today.  Concerns regarding medicines are outlined above.   No orders of the defined types were placed in this encounter.  No orders of the defined types were placed in this encounter.  There are no Patient Instructions on file for this visit.    I,Mathew Stumpf,acting as a scEducation administratoror HeFreada BergeronMD.,have documented all relevant documentation on the behalf of HeFreada BergeronMD,as directed by  HeFreada BergeronMD while in the presence of HeFreada BergeronMD.  I, HeFreada BergeronMD, have reviewed all documentation for this visit. The documentation on 04/16/22 for the exam,  diagnosis, procedures, and orders are all accurate and complete.   Signed, HeFreada BergeronMD  04/16/2022 1:46 PM    [place]

## 2022-04-17 ENCOUNTER — Ambulatory Visit: Payer: No Typology Code available for payment source | Admitting: Dietician

## 2022-04-20 ENCOUNTER — Other Ambulatory Visit: Payer: Self-pay | Admitting: Internal Medicine

## 2022-04-27 ENCOUNTER — Ambulatory Visit: Payer: No Typology Code available for payment source | Admitting: Orthopaedic Surgery

## 2022-04-27 ENCOUNTER — Ambulatory Visit: Payer: No Typology Code available for payment source | Admitting: Cardiology

## 2022-04-27 ENCOUNTER — Encounter: Payer: Self-pay | Admitting: Orthopaedic Surgery

## 2022-04-27 DIAGNOSIS — M1711 Unilateral primary osteoarthritis, right knee: Secondary | ICD-10-CM

## 2022-04-27 DIAGNOSIS — G8929 Other chronic pain: Secondary | ICD-10-CM

## 2022-04-27 DIAGNOSIS — M25561 Pain in right knee: Secondary | ICD-10-CM

## 2022-04-27 DIAGNOSIS — M1712 Unilateral primary osteoarthritis, left knee: Secondary | ICD-10-CM | POA: Diagnosis not present

## 2022-04-27 DIAGNOSIS — M25562 Pain in left knee: Secondary | ICD-10-CM

## 2022-04-27 MED ORDER — METHYLPREDNISOLONE ACETATE 40 MG/ML IJ SUSP
40.0000 mg | INTRAMUSCULAR | Status: AC | PRN
  Administered 2022-04-27: 40 mg via INTRA_ARTICULAR

## 2022-04-27 MED ORDER — LIDOCAINE HCL 1 % IJ SOLN
3.0000 mL | INTRAMUSCULAR | Status: AC | PRN
  Administered 2022-04-27: 3 mL

## 2022-04-27 NOTE — Progress Notes (Signed)
The patient is well-known to me.  She has debilitating arthritis in both her knees.  She is 62 years old.  Her left knee hurts worse than the right.  We have seen her for a long period of time but her BMI is always been significantly high.  She is on Ozempic now and is lost a significant mount of weight.  She is down to 235 from close to 300.  She is requesting steroid she is in both knees today.  She is a prediabetic.  We last injected her knees about 5 months ago.  On exam she does not have a very large soft tissue envelope around either knee.  Both knees are globally tender with varus malalignment.  I did place a steroid injection in both knees today.  I would like to see her back in 3 months.  At that visit we will have a weight and BMI calculation as well as standing AP and lateral both knees.  She agrees with this treatment plan.      Procedure Note  Patient: Mary Herman             Date of Birth: 01-Sep-1960           MRN: [id number]             Visit Date: 04/27/2022  Procedures: Visit Diagnoses:  1. Chronic pain of left knee   2. Chronic pain of right knee   3. Unilateral primary osteoarthritis, left knee   4. Unilateral primary osteoarthritis, right knee     Large Joint Inj: R knee on 04/27/2022 1:51 PM Indications: diagnostic evaluation and pain Details: 22 G 1.5 in needle, superolateral approach  Arthrogram: No  Medications: 3 mL lidocaine 1 %; 40 mg methylPREDNISolone acetate 40 MG/ML Outcome: tolerated well, no immediate complications Procedure, treatment alternatives, risks and benefits explained, specific risks discussed. Consent was given by the patient. Immediately prior to procedure a time out was called to verify the correct patient, procedure, equipment, support staff and site/side marked as required. Patient was prepped and draped in the usual sterile fashion.    Large Joint Inj: L knee on 04/27/2022 1:52 PM Indications: diagnostic evaluation and  pain Details: 22 G 1.5 in needle, superolateral approach  Arthrogram: No  Medications: 3 mL lidocaine 1 %; 40 mg methylPREDNISolone acetate 40 MG/ML Outcome: tolerated well, no immediate complications Procedure, treatment alternatives, risks and benefits explained, specific risks discussed. Consent was given by the patient. Immediately prior to procedure a time out was called to verify the correct patient, procedure, equipment, support staff and site/side marked as required. Patient was prepped and draped in the usual sterile fashion.

## 2022-04-30 ENCOUNTER — Encounter: Payer: Self-pay | Admitting: Radiology

## 2022-05-08 NOTE — Progress Notes (Signed)
Patient Care Team: Elby Showers, MD as PCP - General (Internal Medicine) Himmelrich, Bryson Ha, RD (Inactive) as Dietitian  Visit Date: 05/15/22  Subjective:    Patient ID: Mary Herman , Female   DOB: Oct 30, 1960, 62 y.o.    MRN: [id number]   62 y.o. Female presents today for annual comprehensive physical exam. Patient has a past medical history of anemia, chronic back pain, depression, dyspnea, environmental allergies, fibroid, GERD, hematuria, cystitis, hypertension, kidney stones, obstructive sleep apnea, pneumonia, pre-diabetes, rheumatoid arthritis, right ureteral stone, thyroid disease, trochanteric bursitis of both hips.  History of glucose intolerance. BMI at 42.16. Takes Ozempic 2 mg once weekly. Reports her blood sugar normally runs at 98 on average. She maintains a healthy diet. She has lost 41 lbs between 11/24/21 and today. She has been trying to get in to see a dietitian.  Reports intermittent green/gray post nasal drainage. Previously seen by Dr. West Carbo but now has a new otolaryngologist, Dr. Fredric Dine, who she sees 06/15/22 to be evaluated for sinus issues.  History of rheumatoid arthritis currently treated with Arava 20 mg daily. She will soon be going on Hyrimoz 0.4 cc subcutaneous every other week.   History of depression treated with Wellbutrin XL 300 mg daily, Celexa 20 mg daily in the morning.   History of hypothyroidism treated with Synthroid 75 mcg daily.  History of hyperlipidemia treated with Crestor 5 mg three times weekly.  History of hypertension treated with Toprol-XL 25 mg daily.  History of chronic back pain treated with Celebrex 200 mg twice daily, Flexeril 10 mg daily at bedtime.  Labs collected today. Awaiting results.  Pap smear last completed 05/17/17. Negative for intraepithelial lesion or malignancy. Pelvic deferred to gynecologist.  Mammogram last completed 05/13/21. No mammographic evidence of malignancy. Recommended repeat in  2025.  Plans to do colonoscopy this year.  DEXA scan last completed 07/07/21. T-score at -1.3 at right femoral neck. She has osteopenia. Recommended repeat in 2025.   Past Medical History:  Diagnosis Date   Anemia    Chronic back pain    Depression    Dyspnea    with activity   Environmental allergies    Family history of adverse reaction to anesthesia    daughter- n/v       Fibroid    GERD (gastroesophageal reflux disease)    Hematuria    History of cystitis    INTERSTITIAL CYSTITIS--  NO ISSUES FOR YRS   History of gastroesophageal reflux (GERD)    DENIES ISSUES SINCE GASTRIC BY PASS   History of hypertension    NO ISSUE SINCE WT LOSS AFTER GASTRIC BYPASS   History of kidney stones    History of obstructive sleep apnea    USED CPAP--  NO ISSUE SINCE WT LOSS AFTER GASTRIC BYPASS   Hypertension    Pneumonia    Pre-diabetes    pt. denies   Rheumatoid arthritis ([place])    Right ureteral stone    Sleep apnea    no cpap    Thyroid disease    Trochanteric bursitis of both hips      Family History  Problem Relation Age of Onset   Hypertension Mother    Stroke Mother    Hypertension Brother    Heart disease Maternal Uncle    Diabetes Maternal Grandmother    Cancer Maternal Grandmother        THROAT CA   Cancer Maternal Grandfather        THROAT  CA    Social History   Social History Narrative   Not on file      Review of Systems  Constitutional:  Negative for chills, fever, malaise/fatigue and weight loss.  HENT:  Negative for hearing loss, sinus pain and sore throat.        (+) Intermittent post-nasal drainage  Respiratory:  Negative for cough, hemoptysis and shortness of breath.   Cardiovascular:  Negative for chest pain, palpitations, leg swelling and PND.  Gastrointestinal:  Negative for abdominal pain, constipation, diarrhea, heartburn, nausea and vomiting.  Genitourinary:  Negative for dysuria, frequency and urgency.  Musculoskeletal:  Negative for  back pain, myalgias and neck pain.  Skin:  Negative for itching and rash.  Neurological:  Negative for dizziness, tingling, seizures and headaches.  Endo/Heme/Allergies:  Negative for polydipsia.  Psychiatric/Behavioral:  Negative for depression. The patient is not nervous/anxious.         Objective:   Vitals: BP 132/78   Pulse 94   Temp 98.1 F (36.7 C) (Tympanic)   Ht 5' 2.75" (1.594 m)   Wt 236 lb 1.9 oz (107.1 kg)   LMP 05/25/2014   SpO2 95%   BMI 42.16 kg/m    Physical Exam Vitals and nursing note reviewed.  Constitutional:      General: She is not in acute distress.    Appearance: Normal appearance. She is not ill-appearing or toxic-appearing.  HENT:     Head: Normocephalic and atraumatic.     Right Ear: Hearing, tympanic membrane, ear canal and external ear normal.     Left Ear: Hearing, ear canal and external ear normal.     Ears:     Comments: Left TM slightly full, not erythematous.    Mouth/Throat:     Pharynx: Oropharynx is clear.  Eyes:     Extraocular Movements: Extraocular movements intact.     Pupils: Pupils are equal, round, and reactive to light.  Neck:     Thyroid: No thyroid mass, thyromegaly or thyroid tenderness.     Vascular: No carotid bruit.  Cardiovascular:     Rate and Rhythm: Normal rate and regular rhythm. No extrasystoles are present.    Pulses:          Dorsalis pedis pulses are 1+ on the right side and 1+ on the left side.     Heart sounds: Normal heart sounds. No murmur heard.    No friction rub. No gallop.     Comments: Superficial varicosities of lower extremities. Pulmonary:     Effort: Pulmonary effort is normal.     Breath sounds: Normal breath sounds. No decreased breath sounds, wheezing, rhonchi or rales.  Chest:     Chest wall: No mass.  Abdominal:     Palpations: Abdomen is soft. There is no hepatomegaly, splenomegaly or mass.     Tenderness: There is no abdominal tenderness.     Hernia: No hernia is present.   Musculoskeletal:     Cervical back: Normal range of motion.     Right lower leg: No edema.     Left lower leg: No edema.  Lymphadenopathy:     Cervical: No cervical adenopathy.     Upper Body:     Right upper body: No supraclavicular adenopathy.     Left upper body: No supraclavicular adenopathy.  Skin:    General: Skin is warm and dry.  Neurological:     General: No focal deficit present.     Mental Status: She is  alert and oriented to person, place, and time. Mental status is at baseline.     Sensory: Sensation is intact.     Motor: Motor function is intact. No weakness.     Deep Tendon Reflexes: Reflexes are normal and symmetric.  Psychiatric:        Attention and Perception: Attention normal.        Mood and Affect: Mood normal.        Speech: Speech normal.        Behavior: Behavior normal.        Thought Content: Thought content normal.        Cognition and Memory: Cognition normal.        Judgment: Judgment normal.       Results:   Studies obtained and personally reviewed by me:  Pap smear last completed 05/17/17. Negative for intraepithelial lesion or malignancy. Pelvic deferred to gynecologist.  Mammogram last completed 05/13/21. No mammographic evidence of malignancy. Recommended repeat in 2025.  DEXA scan last completed 07/07/21. T-score at -1.3 at right femoral neck. She has osteopenia. Recommended repeat in 2025.  Labs:       Component Value Date/Time   NA 143 11/20/2021 1016   K 4.3 11/20/2021 1016   CL 106 11/20/2021 1016   CO2 29 11/20/2021 1016   GLUCOSE 117 (H) 11/20/2021 1016   BUN 10 11/20/2021 1016   CREATININE 0.90 11/20/2021 1016   CALCIUM 9.2 11/20/2021 1016   PROT 6.7 11/20/2021 1016   ALBUMIN 3.7 01/27/2019 0855   AST 25 11/20/2021 1016   ALT 23 11/20/2021 1016   ALKPHOS 118 01/27/2019 0855   BILITOT 0.9 11/20/2021 1016   GFRNONAA 82 05/10/2020 1005   GFRAA 95 05/10/2020 1005     Lab Results  Component Value Date   WBC 3.9  11/20/2021   HGB 14.5 11/20/2021   HCT 44.6 11/20/2021   MCV 90.8 11/20/2021   PLT 236 11/20/2021    Lab Results  Component Value Date   CHOL 142 11/20/2021   HDL 76 11/20/2021   LDLCALC 47 11/20/2021   TRIG 102 11/20/2021   CHOLHDL 1.9 11/20/2021    Lab Results  Component Value Date   HGBA1C 5.6 01/20/2022     Lab Results  Component Value Date   TSH 1.22 11/20/2021      Assessment & Plan:   Glucose intolerance: BMI at 42.16. Takes Ozempic 2 mg once weekly. Continue healthy diet and exercise.  Post nasal drainage: previously seen by Dr. West Carbo but now has a new otolaryngologist, Dr. Fredric Dine, who she sees 06/15/22 to be evaluated for sinus issues.  Rheumatoid arthritis: currently treated with Arava 20 mg daily. She will soon be going on Hyrimoz 0.4 cc subcutaneous every other week.   Depression: stable with Wellbutrin XL 300 mg daily, Celexa 20 mg daily in the morning.   Hypothyroidism: treated with Synthroid 75 mcg daily.  Hyperlipidemia: treated with Crestor 5 mg three times weekly.  Hypertension: treated with Toprol-XL 25 mg daily.  Chronic back pain: treated with Celebrex 200 mg twice daily, Flexeril 10 mg daily at bedtime.  Labs collected today. Awaiting results.  Pap smear: last completed 05/17/17. Negative for intraepithelial lesion or malignancy. Pelvic deferred to gynecologist.  Mammogram: last completed 05/13/21. No mammographic evidence of malignancy. Recommended repeat in 2025.  DEXA scan: last completed 07/07/21. T-score at -1.3 at right femoral neck. She has osteopenia. Recommended repeat in 2025.  Referral for colonoscopy, dietitian.  Ordered mammogram, bone density  at [facility].  Vaccine counseling: Due for tetanus toxoid update in June. Received pneumococcal 20 vaccine today.   Return in 1 year for health maintenance exam.    I,Alexander Ruley,acting as a scribe for Elby Showers, MD.,have documented all relevant documentation on the behalf  of Elby Showers, MD,as directed by  Elby Showers, MD while in the presence of Elby Showers, MD.   I, Elby Showers, MD, have reviewed all documentation for this visit. The documentation on 05/20/22 for the exam, diagnosis, procedures, and orders are all accurate and complete.

## 2022-05-15 ENCOUNTER — Ambulatory Visit (INDEPENDENT_AMBULATORY_CARE_PROVIDER_SITE_OTHER): Payer: No Typology Code available for payment source | Admitting: Internal Medicine

## 2022-05-15 ENCOUNTER — Other Ambulatory Visit: Payer: No Typology Code available for payment source

## 2022-05-15 ENCOUNTER — Encounter: Payer: Self-pay | Admitting: Internal Medicine

## 2022-05-15 VITALS — BP 132/78 | HR 94 | Temp 98.1°F | Ht 62.75 in | Wt 236.1 lb

## 2022-05-15 DIAGNOSIS — Z9884 Bariatric surgery status: Secondary | ICD-10-CM

## 2022-05-15 DIAGNOSIS — E039 Hypothyroidism, unspecified: Secondary | ICD-10-CM

## 2022-05-15 DIAGNOSIS — Z1231 Encounter for screening mammogram for malignant neoplasm of breast: Secondary | ICD-10-CM | POA: Diagnosis not present

## 2022-05-15 DIAGNOSIS — Z8659 Personal history of other mental and behavioral disorders: Secondary | ICD-10-CM

## 2022-05-15 DIAGNOSIS — E1169 Type 2 diabetes mellitus with other specified complication: Secondary | ICD-10-CM

## 2022-05-15 DIAGNOSIS — E7800 Pure hypercholesterolemia, unspecified: Secondary | ICD-10-CM

## 2022-05-15 DIAGNOSIS — M059 Rheumatoid arthritis with rheumatoid factor, unspecified: Secondary | ICD-10-CM

## 2022-05-15 DIAGNOSIS — Z0000 Encounter for general adult medical examination without abnormal findings: Secondary | ICD-10-CM

## 2022-05-15 DIAGNOSIS — E2839 Other primary ovarian failure: Secondary | ICD-10-CM

## 2022-05-15 DIAGNOSIS — F419 Anxiety disorder, unspecified: Secondary | ICD-10-CM

## 2022-05-15 DIAGNOSIS — E1165 Type 2 diabetes mellitus with hyperglycemia: Secondary | ICD-10-CM

## 2022-05-15 LAB — POCT URINALYSIS DIPSTICK
Bilirubin, UA: NEGATIVE
Blood, UA: NEGATIVE
Glucose, UA: NEGATIVE
Ketones, UA: NEGATIVE
Leukocytes, UA: NEGATIVE
Nitrite, UA: NEGATIVE
Protein, UA: NEGATIVE
Spec Grav, UA: 1.01 (ref 1.010–1.025)
Urobilinogen, UA: 0.2 E.U./dL
pH, UA: 5 (ref 5.0–8.0)

## 2022-05-15 NOTE — Patient Instructions (Addendum)
Referral to [facility] for screening colonoscopy. Needs mammogram at [facility]. Need tdap in June. Dietary consult. Needs pneumococcal 20

## 2022-05-16 LAB — CBC WITH DIFFERENTIAL/PLATELET
Absolute Monocytes: 382 cells/uL (ref 200–950)
Basophils Absolute: 18 cells/uL (ref 0–200)
Basophils Relative: 0.4 %
Eosinophils Absolute: 161 cells/uL (ref 15–500)
Eosinophils Relative: 3.5 %
HCT: 45.5 % — ABNORMAL HIGH (ref 35.0–45.0)
Hemoglobin: 14.4 g/dL (ref 11.7–15.5)
Lymphs Abs: 1260 cells/uL (ref 850–3900)
MCH: 28.9 pg (ref 27.0–33.0)
MCHC: 31.6 g/dL — ABNORMAL LOW (ref 32.0–36.0)
MCV: 91.2 fL (ref 80.0–100.0)
MPV: 12.4 fL (ref 7.5–12.5)
Monocytes Relative: 8.3 %
Neutro Abs: 2778 cells/uL (ref 1500–7800)
Neutrophils Relative %: 60.4 %
Platelets: 220 10*3/uL (ref 140–400)
RBC: 4.99 10*6/uL (ref 3.80–5.10)
RDW: 12.4 % (ref 11.0–15.0)
Total Lymphocyte: 27.4 %
WBC: 4.6 10*3/uL (ref 3.8–10.8)

## 2022-05-16 LAB — MICROALBUMIN / CREATININE URINE RATIO
Creatinine, Urine: 196 mg/dL (ref 20–275)
Microalb Creat Ratio: 17 mg/g creat (ref <30)
Microalb, Ur: 3.4 mg/dL

## 2022-05-16 LAB — LIPID PANEL
Cholesterol: 155 mg/dL (ref <200)
HDL: 87 mg/dL (ref >=50)
LDL Cholesterol (Calc): 50 mg/dL (calc)
Non-HDL Cholesterol (Calc): 68 mg/dL (calc) (ref <130)
Total CHOL/HDL Ratio: 1.8 (calc) (ref <5.0)
Triglycerides: 96 mg/dL (ref <150)

## 2022-05-16 LAB — HEMOGLOBIN A1C
Hgb A1c MFr Bld: 5.2 % of total Hgb (ref <5.7)
Mean Plasma Glucose: 103 mg/dL
eAG (mmol/L): 5.7 mmol/L

## 2022-05-16 LAB — COMPLETE METABOLIC PANEL WITH GFR
AG Ratio: 1.6 (calc) (ref 1.0–2.5)
ALT: 26 U/L (ref 6–29)
AST: 26 U/L (ref 10–35)
Albumin: 4.1 g/dL (ref 3.6–5.1)
Alkaline phosphatase (APISO): 111 U/L (ref 37–153)
BUN: 15 mg/dL (ref 7–25)
CO2: 27 mmol/L (ref 20–32)
Calcium: 9.5 mg/dL (ref 8.6–10.4)
Chloride: 105 mmol/L (ref 98–110)
Creat: 0.98 mg/dL (ref 0.50–1.03)
Globulin: 2.5 g/dL (calc) (ref 1.9–3.7)
Glucose, Bld: 119 mg/dL — ABNORMAL HIGH (ref 65–99)
Potassium: 4.7 mmol/L (ref 3.5–5.3)
Sodium: 141 mmol/L (ref 135–146)
Total Bilirubin: 1 mg/dL (ref 0.2–1.2)
Total Protein: 6.6 g/dL (ref 6.1–8.1)
eGFR: 66 mL/min/{1.73_m2} (ref >=60)

## 2022-05-16 LAB — TSH: TSH: 0.5 mIU/L (ref 0.40–4.50)

## 2022-05-19 ENCOUNTER — Other Ambulatory Visit: Payer: Self-pay | Admitting: Internal Medicine

## 2022-05-25 ENCOUNTER — Other Ambulatory Visit: Payer: Self-pay | Admitting: Internal Medicine

## 2022-06-10 ENCOUNTER — Telehealth: Payer: Self-pay

## 2022-06-10 NOTE — Telephone Encounter (Signed)
Patient informed of insurance requesting dental and eye exam. She will contact [facility] for an Eye exam and will get dental exam when she is able.

## 2022-06-11 ENCOUNTER — Other Ambulatory Visit: Payer: Self-pay | Admitting: Internal Medicine

## 2022-06-24 NOTE — Progress Notes (Signed)
Spoke with patient and patient stated she usually get mammogram every 2 years but will schedule an appt if she needs to I informed patient order was place in March.

## 2022-07-06 ENCOUNTER — Other Ambulatory Visit: Payer: Self-pay | Admitting: Internal Medicine

## 2022-07-09 ENCOUNTER — Telehealth: Payer: Self-pay | Admitting: Pharmacist

## 2022-07-09 NOTE — Telephone Encounter (Signed)
Called pt to follow up on ozempic. She is doing well. Trying to walk more Weight loss has plateau. Thinks she lost 50lb. Changing how she eats. BG is usually <100.

## 2022-07-23 ENCOUNTER — Other Ambulatory Visit: Payer: Self-pay | Admitting: Internal Medicine

## 2022-08-03 ENCOUNTER — Other Ambulatory Visit: Payer: Self-pay | Admitting: Otolaryngology

## 2022-08-17 ENCOUNTER — Other Ambulatory Visit (INDEPENDENT_AMBULATORY_CARE_PROVIDER_SITE_OTHER): Payer: No Typology Code available for payment source

## 2022-08-17 ENCOUNTER — Encounter: Payer: Self-pay | Admitting: Orthopaedic Surgery

## 2022-08-17 ENCOUNTER — Ambulatory Visit: Payer: No Typology Code available for payment source | Admitting: Orthopaedic Surgery

## 2022-08-17 DIAGNOSIS — M25561 Pain in right knee: Secondary | ICD-10-CM

## 2022-08-17 DIAGNOSIS — M25562 Pain in left knee: Secondary | ICD-10-CM | POA: Diagnosis not present

## 2022-08-17 DIAGNOSIS — G8929 Other chronic pain: Secondary | ICD-10-CM

## 2022-08-17 MED ORDER — METHYLPREDNISOLONE ACETATE 40 MG/ML IJ SUSP
40.0000 mg | INTRAMUSCULAR | Status: AC | PRN
Start: 2022-08-17 — End: 2022-08-17
  Administered 2022-08-17: 40 mg via INTRA_ARTICULAR

## 2022-08-17 MED ORDER — LIDOCAINE HCL 1 % IJ SOLN
3.0000 mL | INTRAMUSCULAR | Status: AC | PRN
Start: 2022-08-17 — End: 2022-08-17
  Administered 2022-08-17: 3 mL

## 2022-08-17 NOTE — Progress Notes (Signed)
The patient is well-known to me.  She has severe incision arthritis in both of her knees.  She is also been on a weight loss journey and her weight today is down to 222 pounds.  She is diabetic but her last hemoglobin A1c was around 5.1.  I was able to see this in the chart.  She continues to lose weight.  She is requesting steroid injections in both knees today.  We have been doing this about every 3 months.  She wants to consider a left knee replacement somewhere after the first of the year and may be January or February.  Both knees were examined today and both knees have significant grinding and patellofemoral crepitation with varus malalignment that is not correctable.  X-rays of both knees show severe end-stage tricompartment arthritis involving all 3 compartments with bone-on-bone wear of all 3 compartments, varus malalignment and osteophytes in all 3 compartments.  I did place a steroid injection in both knees today which she tolerated very well.  She will see [place] back toward the end of September or early October and we can inject her knees then and then work on considering setting her up for a left knee replacement.     Procedure Note  Patient: Mary Herman             Date of Birth: August 09, 1960           MRN: [id number]             Visit Date: 08/17/2022  Procedures: Visit Diagnoses:  1. Chronic pain of left knee   2. Chronic pain of right knee     Large Joint Inj: L knee on 08/17/2022 10:54 AM Indications: diagnostic evaluation and pain Details: 22 G 1.5 in needle, superolateral approach  Arthrogram: No  Medications: 3 mL lidocaine 1 %; 40 mg methylPREDNISolone acetate 40 MG/ML Outcome: tolerated well, no immediate complications Procedure, treatment alternatives, risks and benefits explained, specific risks discussed. Consent was given by the patient. Immediately prior to procedure a time out was called to verify the correct patient, procedure, equipment, support staff and  site/side marked as required. Patient was prepped and draped in the usual sterile fashion.    Large Joint Inj: R knee on 08/17/2022 10:54 AM Indications: diagnostic evaluation and pain Details: 22 G 1.5 in needle, superolateral approach  Arthrogram: No  Medications: 3 mL lidocaine 1 %; 40 mg methylPREDNISolone acetate 40 MG/ML Outcome: tolerated well, no immediate complications Procedure, treatment alternatives, risks and benefits explained, specific risks discussed. Consent was given by the patient. Immediately prior to procedure a time out was called to verify the correct patient, procedure, equipment, support staff and site/side marked as required. Patient was prepped and draped in the usual sterile fashion.

## 2022-08-24 ENCOUNTER — Other Ambulatory Visit: Payer: Self-pay | Admitting: Internal Medicine

## 2022-08-26 LAB — HM DIABETES EYE EXAM

## 2022-08-26 LAB — HM MAMMOGRAPHY

## 2022-08-31 ENCOUNTER — Encounter: Payer: Self-pay | Admitting: Internal Medicine

## 2022-09-09 ENCOUNTER — Encounter (HOSPITAL_COMMUNITY): Payer: Self-pay | Admitting: Vascular Surgery

## 2022-09-09 ENCOUNTER — Encounter (HOSPITAL_COMMUNITY): Payer: Self-pay | Admitting: Otolaryngology

## 2022-09-09 ENCOUNTER — Other Ambulatory Visit: Payer: Self-pay

## 2022-09-09 NOTE — Progress Notes (Signed)
Anesthesia Chart Review: SAME DAY WORK-UP  Case: [id number] Date/Time: 09/11/22 1000   Procedures:      SINUS ENDO WITH FUSION (Right)     TURBINATE REDUCTION/SUBMUCOSAL RESECTION (Bilateral)   Anesthesia type: General   Pre-op diagnosis:      Chronic maxillary sinusitis     Hypertrophy of inferior nasal turbinate     Seasonal allergic rhinitis due to pollen   [facility] ROOM 09 / [facility]   Surgeons: Cheron Schaumann A, DO       DISCUSSION: Patient is a 62 year old female scheduled for the above procedure.  History includes never smoker, HTN, RA, pre-diabetes (she is on Ozempic more for weight), hypothyroidism, GERD, anemia, OSA (does not use CPAP since weight loss), exertional dyspnea, chronic back pain, obesity (s/p laparoscopic Roux-en-Y gastric bypass 07/25/07), complex atypical endometrial hyperplasia (s/p robotic-assisted laparoscopic total hysterectomy/BSO 01/31/19), TMJ arthroplasty (1990s).    She got established with cardiologist Dr. Shari Prows on 10/22/21 for abnormal EKG that showed sinus tachycardia, low voltage in precordial leads, old inferior infarct.  She had known stable chronic DOE and low energy attributed mainly to weight and deconditioning. An echo and NM PET CT Cardiac perfusion study were ordered. 12/23/21 perfusion study was non-ischemic, no evidence of infarct, resting EF 66%, stress EF 73%, coronary calcium absent on CT images, normal/low risk study.  11/20/21 echo showed LVEF 60-65%, no regional wall motion abnormalities, normal RV systolic function. She had normal coronaries by 11/2006 cath. She had initially lost 115 lb after 2009 gastric bypass but had gained some weight back. GLP-1 agonist and dietary modifications recommended.    A1c 5.2% on 05/15/22. She is on Ozempic more for weight loss. She did not recall any preoperative Ozempic instructions. Last Ozempic 2 mg was on 09/06/22. GETA is anticipated. I sent communication to Dr. Marene Lenz. Assigned anesthesiologist to  evaluate on the day of surgery.    VS:  BP Readings from Last 3 Encounters:  05/15/22 132/78  01/20/22 130/72  12/23/21 125/78   Pulse Readings from Last 3 Encounters:  05/15/22 94  01/20/22 (!) 102  11/24/21 82     PROVIDERS: Margaree Mackintosh, MD is PCP  - Laurance Flatten, MD was cardiologist, but is/has relocated and reportedly will seeing Carolan Clines, MD around September 2024.   - Shamleffer, Brent Bulla, MD is endocrinologist   LABS: For day of surgery as indicated.  Most recent lab results in [facility] include: Lab Results  Component Value Date   WBC 4.6 05/15/2022   HGB 14.4 05/15/2022   HCT 45.5 (H) 05/15/2022   PLT 220 05/15/2022   GLUCOSE 119 (H) 05/15/2022   CHOL 155 05/15/2022   TRIG 96 05/15/2022   HDL 87 05/15/2022   LDLCALC 50 05/15/2022   ALT 26 05/15/2022   AST 26 05/15/2022   NA 141 05/15/2022   K 4.7 05/15/2022   CL 105 05/15/2022   CREATININE 0.98 05/15/2022   BUN 15 05/15/2022   CO2 27 05/15/2022   TSH 0.50 05/15/2022   HGBA1C 5.2 05/15/2022   MICROALBUR 3.4 05/15/2022    IMAGES: CT Facial bones 07/22/22 ([facility]): IMPRESSION: 1. Active right maxillary sinusitis which is progressed from  01/20/2022.  2. Diffusely patent sinus outflow including at the right maxillary  sinus.    EKG: EKG 10/22/2021 ([facility]):  Sinus tachycardia at 107 bpm Left axis deviation Inferior infarct, age undetermined Anterolateral infarct, age undetermined   CV: NM PET CT Cardiac Perfusion Study 12/23/21:   LV perfusion  is normal. There is no evidence of ischemia. There is no evidence of infarction.   Rest left ventricular function is normal. Rest EF: 66 %. Stress left ventricular function is normal. Stress EF: 73 %. End diastolic cavity size is normal. End systolic cavity size is normal.   Myocardial blood flow was computed to be 1.69ml/g/min at rest and 4.27ml/g/min at stress. Global myocardial blood flow reserve was 2.77 and was normal.   Coronary  calcium was absent on the attenuation correction CT images.   The study is normal. The study is low risk.   Echo 11/20/21: IMPRESSIONS   1. Left ventricular ejection fraction, by estimation, is 60 to 65%. The  left ventricle has normal function. The left ventricle has no regional  wall motion abnormalities. Left ventricular diastolic parameters were  normal.   2. Right ventricular systolic function is normal. The right ventricular  size is normal.   3. The mitral valve is normal in structure. No evidence of mitral valve  regurgitation. No evidence of mitral stenosis.   4. The aortic valve is tricuspid. Aortic valve regurgitation is not  visualized. No aortic stenosis is present.   5. The inferior vena cava is normal in size with greater than 50%  respiratory variability, suggesting right atrial pressure of 3 mmHg.  - Comparison(s): No prior Echocardiogram. Report only-[facility]  11/03/11 EF 65-70%. PA pressure .    Cardiac cath 12/07/06: CONCLUSION:  1. Normal coronary arteries.  2. Normal left ventricular function.  3. Mildly increased end-diastolic pressure.   Past Medical History:  Diagnosis Date   Anemia    Chronic back pain    Depression    Dyspnea    with activity   Environmental allergies    Family history of adverse reaction to anesthesia    daughter- n/v       Fibroid    uterine   GERD (gastroesophageal reflux disease)    Hematuria    History of cystitis    INTERSTITIAL CYSTITIS--  NO ISSUES FOR YRS   History of gastroesophageal reflux (GERD)    DENIES ISSUES SINCE GASTRIC BY PASS   History of hypertension    NO ISSUE SINCE WT LOSS AFTER GASTRIC BYPASS   History of kidney stones    History of obstructive sleep apnea    USED CPAP--  NO ISSUE SINCE WT LOSS AFTER GASTRIC BYPASS   Hypertension    Pneumonia    Pre-diabetes 06/2021   pt. denies   Rheumatoid arthritis ([facility])    Right ureteral stone    Sleep apnea    no cpap    Thyroid disease     Trochanteric bursitis of both hips     Past Surgical History:  Procedure Laterality Date   CARDIAC CATHETERIZATION  12-07-2006  DR [facility]   NORMAL CORONARIES ARTERIES/ NORMAL LVF/ MILD INCREASED END-DIASTOLIC PRESSURE   CESAREAN SECTION  1998   CHOLECYSTECTOMY  1980'S   CYSTOSCOPY WITH RETROGRADE PYELOGRAM, URETEROSCOPY AND STENT PLACEMENT Bilateral 12/26/2012   Procedure: cystoscopy with bilateral retrogrades, bilateral ureteroscopy ;  Surgeon: Valetta Fuller, MD;  Location: [facility];  Service: Urology;  Laterality: Bilateral;   CYSTOSCOPY WITH STENT PLACEMENT Bilateral 12/26/2012   Procedure: CYSTOSCOPY WITH STENT PLACEMENT;  Surgeon: Valetta Fuller, MD;  Location: [facility];  Service: Urology;  Laterality: Bilateral;   CYSTOSCOPY/ HYDRODISTENTION  YRS AGO   DILATATION & CURETTAGE/HYSTEROSCOPY WITH MYOSURE N/A 11/25/2018   Procedure: DILATATION & CURETTAGE/HYSTEROSCOPY WITH MYOSURE;  Surgeon: Dara Lords, MD;  Location: [facility] OR;  Service: Gynecology;  Laterality: N/A;   HERNIA REPAIR     HOLMIUM LASER APPLICATION Right 12/26/2012   Procedure: HOLMIUM LASER APPLICATION;  Surgeon: Valetta Fuller, MD;  Location: [facility];  Service: Urology;  Laterality: Right;   KNEE ARTHROSCOPY Left 1992   ROBOTIC ASSISTED TOTAL HYSTERECTOMY WITH BILATERAL SALPINGO OOPHERECTOMY N/A 01/31/2019   Procedure: XI ROBOTIC ASSISTED TOTAL HYSTERECTOMY WITH BILATERAL SALPINGO OOPHORECTOMY;  Surgeon: Adolphus Birchwood, MD;  Location: [facility];  Service: Gynecology;  Laterality: N/A;   ROUX-EN-Y PROCEDURE  07/25/2007   SHOULDER ARTHROSCOPY Right 2011   TMJ ARTHROPLASTY  1990'S   BILATERAL UPPER   TOTAL ABDOMINAL HYSTERECTOMY      MEDICATIONS: No current facility-administered medications for this encounter.    Adalimumab-adaz (HYRIMOZ) 40 MG/0.4ML SOAJ   buPROPion (WELLBUTRIN XL) 300 MG 24 hr tablet   celecoxib (CELEBREX) 200 MG capsule   citalopram (CELEXA)  20 MG tablet   clotrimazole-betamethasone (LOTRISONE) cream   cyclobenzaprine (FLEXERIL) 10 MG tablet   diazepam (VALIUM) 5 MG tablet   folic acid (FOLVITE) 1 MG tablet   HYDROcodone-acetaminophen (NORCO/VICODIN) 5-325 MG tablet   leflunomide (ARAVA) 20 MG tablet   levocetirizine (XYZAL) 5 MG tablet   levothyroxine (SYNTHROID) 75 MCG tablet   Melatonin 10 MG CAPS   metoprolol succinate (TOPROL-XL) 25 MG 24 hr tablet   montelukast (SINGULAIR) 10 MG tablet   OVER THE COUNTER MEDICATION   rosuvastatin (CRESTOR) 5 MG tablet   Semaglutide, 2 MG/DOSE, (OZEMPIC, 2 MG/DOSE,) 8 MG/3ML SOPN   Vitamin D, Ergocalciferol, (DRISDOL) 1.25 MG (50000 UNIT) CAPS capsule   ACCU-CHEK GUIDE test strip   Accu-Chek Softclix Lancets lancets    Shonna Chock, PA-C Surgical Short Stay/Anesthesiology [facility] Phone [phone] [facility] Phone [phone] 09/09/2022 3:50 PM

## 2022-09-09 NOTE — Anesthesia Preprocedure Evaluation (Deleted)
Anesthesia Evaluation    Airway        Dental   Pulmonary           Cardiovascular hypertension,      Neuro/Psych    GI/Hepatic   Endo/Other  diabetes    Renal/GU      Musculoskeletal   Abdominal   Peds  Hematology   Anesthesia Other Findings   Reproductive/Obstetrics                             Anesthesia Physical Anesthesia Plan  ASA:   Anesthesia Plan:    Post-op Pain Management:    Induction:   PONV Risk Score and Plan:   Airway Management Planned:   Additional Equipment:   Intra-op Plan:   Post-operative Plan:   Informed Consent:   Plan Discussed with:   Anesthesia Plan Comments: (PAT note written 09/09/2022 by Shonna Chock, PA-C.  )       Anesthesia Quick Evaluation

## 2022-09-09 NOTE — Progress Notes (Signed)
PCP - Dr Eden Emms Baxley  Cardiologist - Dr Shari Prows but will start seeing Dr Carolan Clines in 10/2022.  Chest X-ray - n/a EKG - 10/22/21 Stress Test - 12/23/21 ECHO - 11/20/21 Cardiac Cath - 12/07/06  ICD Pacemaker/Loop - n/a  Sleep Study -  Yes CPAP - does not use CPAP - lost weight  Diabetes  Takes Ozempic on Sundays. Last dose was on 09/06/22.  Does not check her blood sugars very often.  ERAS: Clear liquids til 7 AM DOS.  Anesthesia review: Yes  STOP now taking any Aspirin (unless otherwise instructed by your surgeon), Aleve, Celebrex, Naproxen, Ibuprofen, Motrin, Advil, Goody's, BC's, all herbal medications, fish oil, and all vitamins.   Coronavirus Screening Do you have any of the following symptoms:  Cough yes/no: No Fever (>100.10F)  yes/no: No Runny nose yes/no: No Sore throat yes/no: No Difficulty breathing/shortness of breath  yes/no: No // Have you traveled in the last 14 days and where? yes/no: No  Patient verbalized understanding of instructions that were given via phone.

## 2022-09-11 NOTE — Progress Notes (Signed)
Patient called and made aware of surgery cancellation due to system outage.

## 2022-09-14 ENCOUNTER — Ambulatory Visit: Payer: No Typology Code available for payment source | Admitting: Cardiology

## 2022-09-23 ENCOUNTER — Encounter (HOSPITAL_COMMUNITY): Payer: Self-pay | Admitting: Otolaryngology

## 2022-09-23 ENCOUNTER — Other Ambulatory Visit: Payer: Self-pay

## 2022-09-23 NOTE — Progress Notes (Signed)
SDW call  Patient was given pre-op instructions over the phone. Patient verbalized understanding of instructions provided.     PCP - Dr. Audelia Hives Cardiologist - Dr. Carolan Clines Pulmonary:    PPM/ICD - denies Device Orders - n/a Rep Notified - n/a   Chest x-ray - n/a EKG -  10/22/2021 Stress Test -12/23/2021 ECHO - 11/20/2021 Cardiac Cath - 2008  Sleep Study/sleep apnea/CPAP: Has had a sleep study,  lost weight and no longer uses CPAP  Type II diabetic Fasting Blood sugar range: 90-100 How often check sugars: states a few times per week Ozempic, states last dose 09/13/2022   Blood Thinner Instructions: denies Aspirin Instructions:denies Hold Celebrex, states last dose 09/09/2022   ERAS Protcol - Yes, clear fluids until 0815   COVID TEST- n/a   Anesthesia review: Yes. Previously reviewed 09/09/2022. HTN, DM, sleep apnea   Patient denies shortness of breath, fever, cough and chest pain over the phone call  Your procedure is scheduled on Friday September 25, 2022  Report to [facility] Main Entrance "A" at 0845 A.M., then check in with the Admitting office.  Call this number if you have problems the morning of surgery:  [phone]   If you have any questions prior to your surgery date call [phone]: Open Monday-Friday 8am-4pm If you experience any cold or flu symptoms such as cough, fever, chills, shortness of breath, etc. between now and your scheduled surgery, please notify us at the above number    Remember:  Do not eat after midnight the night before your surgery  You may drink clear liquids until 0815 the morning of your surgery.   Clear liquids allowed are: Water, Non-Citrus Juices (without pulp), Carbonated Beverages, Clear Tea, Black Coffee ONLY (NO MILK, CREAM OR POWDERED CREAMER of any kind), and Gatorade   Take these medicines the morning of surgery with A SIP OF WATER:  Wellbutrin, celexa, arava, metoprolol  As needed: Norco, xyzal  As of today, STOP  taking any Aspirin (unless otherwise instructed by your surgeon) Aleve, Naproxen, Ibuprofen, Motrin, Advil, Goody's, BC's, all herbal medications, fish oil, and all vitamins. This includes your Celebrex

## 2022-09-24 NOTE — Progress Notes (Signed)
Patient voiced understanding of new arrival time of 0730 and clear liquids okay until 0700.

## 2022-09-24 NOTE — Anesthesia Preprocedure Evaluation (Addendum)
Anesthesia Evaluation  Patient identified by MRN, date of birth, ID band Patient awake    Reviewed: Allergy & Precautions, NPO status , Patient's Chart, lab work & pertinent test results  Airway Mallampati: I   Neck ROM: Full    Dental  (+) Edentulous Upper, Dental Advisory Given   Pulmonary sleep apnea    breath sounds clear to auscultation       Cardiovascular hypertension,  Rhythm:Regular Rate:Normal     Neuro/Psych  PSYCHIATRIC DISORDERS  Depression    negative neurological ROS     GI/Hepatic Neg liver ROS,GERD  ,,  Endo/Other  diabetesHypothyroidism    Renal/GU negative Renal ROS     Musculoskeletal  (+) Arthritis , Rheumatoid disorders,    Abdominal   Peds  Hematology negative hematology ROS (+)   Anesthesia Other Findings   Reproductive/Obstetrics                             Anesthesia Physical Anesthesia Plan  ASA: 3  Anesthesia Plan: General   Post-op Pain Management: Tylenol PO (pre-op)*   Induction: Intravenous  PONV Risk Score and Plan: 4 or greater and Ondansetron, Dexamethasone, Midazolam and Scopolamine patch - Pre-op  Airway Management Planned: Oral ETT  Additional Equipment: None  Intra-op Plan:   Post-operative Plan: Extubation in OR  Informed Consent: I have reviewed the patients History and Physical, chart, labs and discussed the procedure including the risks, benefits and alternatives for the proposed anesthesia with the patient or authorized representative who has indicated his/her understanding and acceptance.     Dental advisory given  Plan Discussed with: CRNA  Anesthesia Plan Comments: (PAT note written 09/24/2022 by Shonna Chock, PA-C.  )       Anesthesia Quick Evaluation

## 2022-09-24 NOTE — Progress Notes (Signed)
Anesthesia APP Follow-up: See my previous note from Date of Service 09/09/22. Sinus Endo with [facility] was planned for 719/24, but was cancelled due to the international computer issues related to the [employer] outage. Surgery is now rescheduled for 09/25/22 with Dr. Marene Lenz.  Last Ozempic 09/13/22.   Anesthesia team to evaluate on the day of surgery.  Shonna Chock, PA-C Surgical Short Stay/Anesthesiology [facility] Phone [phone] [facility] Phone [phone] 09/24/2022 11:36 AM

## 2022-09-25 ENCOUNTER — Encounter (HOSPITAL_COMMUNITY): Payer: No Typology Code available for payment source | Admitting: Vascular Surgery

## 2022-09-25 ENCOUNTER — Ambulatory Visit (HOSPITAL_COMMUNITY)
Admission: RE | Admit: 2022-09-25 | Discharge: 2022-09-25 | Disposition: A | Discharge status: Home / Self Care | Payer: No Typology Code available for payment source | Attending: Otolaryngology | Admitting: Otolaryngology

## 2022-09-25 ENCOUNTER — Other Ambulatory Visit: Payer: Self-pay

## 2022-09-25 ENCOUNTER — Encounter (HOSPITAL_COMMUNITY)
Admission: RE | Disposition: A | Discharge status: Home / Self Care | Payer: Self-pay | Source: Home / Self Care | Attending: Otolaryngology

## 2022-09-25 ENCOUNTER — Encounter (HOSPITAL_COMMUNITY): Payer: Self-pay | Admitting: Otolaryngology

## 2022-09-25 ENCOUNTER — Encounter (HOSPITAL_COMMUNITY): Payer: Self-pay | Admitting: Vascular Surgery

## 2022-09-25 DIAGNOSIS — G8929 Other chronic pain: Secondary | ICD-10-CM | POA: Diagnosis not present

## 2022-09-25 DIAGNOSIS — G4733 Obstructive sleep apnea (adult) (pediatric): Secondary | ICD-10-CM | POA: Insufficient documentation

## 2022-09-25 DIAGNOSIS — J320 Chronic maxillary sinusitis: Secondary | ICD-10-CM

## 2022-09-25 DIAGNOSIS — Z9884 Bariatric surgery status: Secondary | ICD-10-CM | POA: Insufficient documentation

## 2022-09-25 DIAGNOSIS — E039 Hypothyroidism, unspecified: Secondary | ICD-10-CM | POA: Diagnosis not present

## 2022-09-25 DIAGNOSIS — E669 Obesity, unspecified: Secondary | ICD-10-CM | POA: Diagnosis not present

## 2022-09-25 DIAGNOSIS — R7303 Prediabetes: Secondary | ICD-10-CM | POA: Diagnosis not present

## 2022-09-25 DIAGNOSIS — M069 Rheumatoid arthritis, unspecified: Secondary | ICD-10-CM | POA: Insufficient documentation

## 2022-09-25 DIAGNOSIS — Z7722 Contact with and (suspected) exposure to environmental tobacco smoke (acute) (chronic): Secondary | ICD-10-CM | POA: Diagnosis not present

## 2022-09-25 DIAGNOSIS — Z6838 Body mass index (BMI) 38.0-38.9, adult: Secondary | ICD-10-CM | POA: Insufficient documentation

## 2022-09-25 DIAGNOSIS — Z7985 Long-term (current) use of injectable non-insulin antidiabetic drugs: Secondary | ICD-10-CM | POA: Insufficient documentation

## 2022-09-25 DIAGNOSIS — I10 Essential (primary) hypertension: Secondary | ICD-10-CM | POA: Insufficient documentation

## 2022-09-25 DIAGNOSIS — J343 Hypertrophy of nasal turbinates: Secondary | ICD-10-CM | POA: Diagnosis not present

## 2022-09-25 DIAGNOSIS — Z90710 Acquired absence of both cervix and uterus: Secondary | ICD-10-CM | POA: Diagnosis not present

## 2022-09-25 DIAGNOSIS — J301 Allergic rhinitis due to pollen: Secondary | ICD-10-CM | POA: Insufficient documentation

## 2022-09-25 DIAGNOSIS — J302 Other seasonal allergic rhinitis: Secondary | ICD-10-CM | POA: Diagnosis not present

## 2022-09-25 DIAGNOSIS — J329 Chronic sinusitis, unspecified: Secondary | ICD-10-CM | POA: Insufficient documentation

## 2022-09-25 HISTORY — PX: SINUS ENDO WITH FUSION: SHX5329

## 2022-09-25 HISTORY — DX: Hypothyroidism, unspecified: E03.9

## 2022-09-25 HISTORY — PX: NASAL TURBINATE REDUCTION: SHX2072

## 2022-09-25 HISTORY — DX: Respiratory tuberculosis unspecified: A15.9

## 2022-09-25 HISTORY — DX: Type 2 diabetes mellitus without complications: E11.9

## 2022-09-25 HISTORY — PX: ETHMOIDECTOMY: SHX5197

## 2022-09-25 HISTORY — PX: MAXILLARY ANTROSTOMY: SHX2003

## 2022-09-25 LAB — GLUCOSE, CAPILLARY
Glucose-Capillary: 102 mg/dL — ABNORMAL HIGH (ref 70–99)
Glucose-Capillary: 137 mg/dL — ABNORMAL HIGH (ref 70–99)
Glucose-Capillary: 60 mg/dL — ABNORMAL LOW (ref 70–99)
Glucose-Capillary: 121 mg/dL — ABNORMAL HIGH (ref 70–99)

## 2022-09-25 LAB — BASIC METABOLIC PANEL
Anion gap: 13 (ref 5–15)
BUN: 11 mg/dL (ref 6–20)
CO2: 19 mmol/L — ABNORMAL LOW (ref 22–32)
Calcium: 8.6 mg/dL — ABNORMAL LOW (ref 8.9–10.3)
Chloride: 106 mmol/L (ref 98–111)
Creatinine, Ser: 0.96 mg/dL (ref 0.44–1.00)
GFR, Estimated: >60 mL/min (ref >60)
Glucose, Bld: 103 mg/dL — ABNORMAL HIGH (ref 70–99)
Potassium: 3.9 mmol/L (ref 3.5–5.1)
Sodium: 138 mmol/L (ref 135–145)

## 2022-09-25 LAB — AEROBIC/ANAEROBIC CULTURE W GRAM STAIN (SURGICAL/DEEP WOUND)

## 2022-09-25 LAB — CBC
HCT: 41.4 % (ref 36.0–46.0)
Hemoglobin: 13 g/dL (ref 12.0–15.0)
MCH: 28.9 pg (ref 26.0–34.0)
MCHC: 31.4 g/dL (ref 30.0–36.0)
MCV: 92 fL (ref 80.0–100.0)
Platelets: 163 10*3/uL (ref 150–400)
RBC: 4.5 MIL/uL (ref 3.87–5.11)
RDW: 13 % (ref 11.5–15.5)
WBC: 3.5 10*3/uL — ABNORMAL LOW (ref 4.0–10.5)
nRBC: 0 % (ref 0.0–0.2)

## 2022-09-25 SURGERY — SURGERY, PARANASAL SINUS, ENDOSCOPIC, WITH NASAL SEPTOPLASTY, TURBINOPLASTY, AND MAXILLARY SINUSOTOMY
Anesthesia: General | Site: Nose | Laterality: Right

## 2022-09-25 MED ORDER — METOPROLOL SUCCINATE ER 25 MG PO TB24
25.0000 mg | ORAL_TABLET | Freq: Once | ORAL | Status: AC
  Administered 2022-09-25: 25 mg via ORAL
  Filled 2022-09-25 (×2): qty 1

## 2022-09-25 MED ORDER — DEXTROSE 50 % IV SOLN
INTRAVENOUS | Status: AC
  Administered 2022-09-25: 25 mL
  Filled 2022-09-25: qty 50

## 2022-09-25 MED ORDER — HEMOSTATIC AGENTS (NO CHARGE) OPTIME
TOPICAL | Status: DC | PRN
  Administered 2022-09-25: 1 via TOPICAL

## 2022-09-25 MED ORDER — SUGAMMADEX SODIUM 200 MG/2ML IV SOLN
INTRAVENOUS | Status: DC | PRN
  Administered 2022-09-25: 200 mg via INTRAVENOUS

## 2022-09-25 MED ORDER — EPINEPHRINE HCL (NASAL) 0.1 % NA SOLN
NASAL | Status: DC | PRN
  Administered 2022-09-25: 10 mL via NASAL

## 2022-09-25 MED ORDER — ACETAMINOPHEN 10 MG/ML IV SOLN: 1000.0000 mg | Freq: Once | INTRAVENOUS | Status: DC | PRN

## 2022-09-25 MED ORDER — PHENYLEPHRINE HCL-NACL 20-0.9 MG/250ML-% IV SOLN
INTRAVENOUS | Status: DC | PRN
  Administered 2022-09-25: 25 ug/min via INTRAVENOUS

## 2022-09-25 MED ORDER — 0.9 % SODIUM CHLORIDE (POUR BTL) OPTIME
TOPICAL | Status: DC | PRN
  Administered 2022-09-25: 1000 mL

## 2022-09-25 MED ORDER — LIDOCAINE-EPINEPHRINE 1 %-1:100000 IJ SOLN
INTRAMUSCULAR | Status: DC | PRN
  Administered 2022-09-25: 8.4 mL

## 2022-09-25 MED ORDER — LACTATED RINGERS IV SOLN: INTRAVENOUS | Status: DC

## 2022-09-25 MED ORDER — ATROPINE SULFATE 0.4 MG/ML IV SOLN
INTRAVENOUS | Status: AC
  Filled 2022-09-25: qty 1

## 2022-09-25 MED ORDER — ONDANSETRON HCL 4 MG/2ML IJ SOLN
INTRAMUSCULAR | Status: DC | PRN
  Administered 2022-09-25: 4 mg via INTRAVENOUS

## 2022-09-25 MED ORDER — EPINEPHRINE HCL (NASAL) 0.1 % NA SOLN
NASAL | Status: AC
  Filled 2022-09-25: qty 90

## 2022-09-25 MED ORDER — AMISULPRIDE (ANTIEMETIC) 5 MG/2ML IV SOLN: 10.0000 mg | Freq: Once | INTRAVENOUS | Status: DC | PRN

## 2022-09-25 MED ORDER — ORAL CARE MOUTH RINSE: 15.0000 mL | Freq: Once | OROMUCOSAL | Status: AC

## 2022-09-25 MED ORDER — ONDANSETRON HCL 4 MG/2ML IJ SOLN
INTRAMUSCULAR | Status: AC
  Filled 2022-09-25: qty 2

## 2022-09-25 MED ORDER — INSULIN ASPART 100 UNIT/ML IJ SOLN: 0.0000 [IU] | INTRAMUSCULAR | Status: DC | PRN

## 2022-09-25 MED ORDER — OXYCODONE HCL 5 MG PO TABS: 5.0000 mg | ORAL_TABLET | Freq: Once | ORAL | Status: AC | PRN

## 2022-09-25 MED ORDER — ACETAMINOPHEN 160 MG/5ML PO SOLN: 325.0000 mg | ORAL | Status: DC | PRN

## 2022-09-25 MED ORDER — MIDAZOLAM HCL 2 MG/2ML IJ SOLN
INTRAMUSCULAR | Status: AC
  Filled 2022-09-25: qty 2

## 2022-09-25 MED ORDER — MIDAZOLAM HCL 2 MG/2ML IJ SOLN
INTRAMUSCULAR | Status: DC | PRN
  Administered 2022-09-25: 2 mg via INTRAVENOUS

## 2022-09-25 MED ORDER — ROCURONIUM BROMIDE 10 MG/ML (PF) SYRINGE
PREFILLED_SYRINGE | INTRAVENOUS | Status: AC
  Filled 2022-09-25: qty 10

## 2022-09-25 MED ORDER — DEXAMETHASONE SODIUM PHOSPHATE 10 MG/ML IJ SOLN
INTRAMUSCULAR | Status: AC
  Filled 2022-09-25: qty 1

## 2022-09-25 MED ORDER — PREDNISONE 10 MG PO TABS: ORAL_TABLET | ORAL | 0 refills | Status: AC

## 2022-09-25 MED ORDER — CEFADROXIL 500 MG PO CAPS: 500.0000 mg | ORAL_CAPSULE | Freq: Two times a day (BID) | ORAL | 0 refills | Status: AC

## 2022-09-25 MED ORDER — FENTANYL CITRATE (PF) 250 MCG/5ML IJ SOLN
INTRAMUSCULAR | Status: AC
  Filled 2022-09-25: qty 5

## 2022-09-25 MED ORDER — PROPOFOL 10 MG/ML IV BOLUS
INTRAVENOUS | Status: DC | PRN
Start: 2022-09-25 — End: 2022-09-25
  Administered 2022-09-25: 150 mg via INTRAVENOUS

## 2022-09-25 MED ORDER — ROCURONIUM BROMIDE 10 MG/ML (PF) SYRINGE
PREFILLED_SYRINGE | INTRAVENOUS | Status: DC | PRN
  Administered 2022-09-25: 60 mg via INTRAVENOUS

## 2022-09-25 MED ORDER — SUCCINYLCHOLINE CHLORIDE 200 MG/10ML IV SOSY
PREFILLED_SYRINGE | INTRAVENOUS | Status: AC
  Filled 2022-09-25: qty 10

## 2022-09-25 MED ORDER — FENTANYL CITRATE (PF) 250 MCG/5ML IJ SOLN
INTRAMUSCULAR | Status: DC | PRN
  Administered 2022-09-25 (×2): 50 ug via INTRAVENOUS

## 2022-09-25 MED ORDER — FENTANYL CITRATE (PF) 100 MCG/2ML IJ SOLN
25.0000 ug | INTRAMUSCULAR | Status: DC | PRN
  Administered 2022-09-25: 50 ug via INTRAVENOUS

## 2022-09-25 MED ORDER — CEFAZOLIN SODIUM-DEXTROSE 2-4 GM/100ML-% IV SOLN
2.0000 g | INTRAVENOUS | Status: AC
  Administered 2022-09-25: 2 g via INTRAVENOUS
  Filled 2022-09-25: qty 100

## 2022-09-25 MED ORDER — PROPOFOL 10 MG/ML IV BOLUS
INTRAVENOUS | Status: AC
  Filled 2022-09-25: qty 20

## 2022-09-25 MED ORDER — FENTANYL CITRATE (PF) 100 MCG/2ML IJ SOLN
INTRAMUSCULAR | Status: AC
  Filled 2022-09-25: qty 2

## 2022-09-25 MED ORDER — LIDOCAINE-EPINEPHRINE 1 %-1:100000 IJ SOLN
INTRAMUSCULAR | Status: AC
  Filled 2022-09-25: qty 1

## 2022-09-25 MED ORDER — SODIUM CHLORIDE 0.9 % IR SOLN
Status: DC | PRN
  Administered 2022-09-25: 1000 mL

## 2022-09-25 MED ORDER — SCOPOLAMINE 1 MG/3DAYS TD PT72
1.0000 | MEDICATED_PATCH | TRANSDERMAL | Status: DC
  Administered 2022-09-25: 1.5 mg via TRANSDERMAL
  Filled 2022-09-25: qty 1

## 2022-09-25 MED ORDER — OXYCODONE HCL 5 MG/5ML PO SOLN
5.0000 mg | Freq: Once | ORAL | Status: AC | PRN
  Administered 2022-09-25: 5 mg via ORAL

## 2022-09-25 MED ORDER — ACETAMINOPHEN 500 MG PO TABS
1000.0000 mg | ORAL_TABLET | Freq: Once | ORAL | Status: AC
  Administered 2022-09-25: 1000 mg via ORAL
  Filled 2022-09-25: qty 2

## 2022-09-25 MED ORDER — DEXAMETHASONE SODIUM PHOSPHATE 10 MG/ML IJ SOLN
INTRAMUSCULAR | Status: DC | PRN
  Administered 2022-09-25: 10 mg via INTRAVENOUS

## 2022-09-25 MED ORDER — PROMETHAZINE HCL 25 MG/ML IJ SOLN: 6.2500 mg | INTRAMUSCULAR | Status: DC | PRN

## 2022-09-25 MED ORDER — ACETAMINOPHEN 325 MG PO TABS: 325.0000 mg | ORAL_TABLET | ORAL | Status: DC | PRN

## 2022-09-25 MED ORDER — DEXMEDETOMIDINE HCL IN NACL 80 MCG/20ML IV SOLN
INTRAVENOUS | Status: AC
  Filled 2022-09-25: qty 20

## 2022-09-25 MED ORDER — LIDOCAINE 2% (20 MG/ML) 5 ML SYRINGE
INTRAMUSCULAR | Status: AC
  Filled 2022-09-25: qty 5

## 2022-09-25 MED ORDER — LIDOCAINE 2% (20 MG/ML) 5 ML SYRINGE
INTRAMUSCULAR | Status: DC | PRN
  Administered 2022-09-25: 40 mg via INTRAVENOUS

## 2022-09-25 MED ORDER — OXYCODONE HCL 5 MG/5ML PO SOLN
ORAL | Status: AC
  Filled 2022-09-25: qty 5

## 2022-09-25 MED ORDER — CHLORHEXIDINE GLUCONATE 0.12 % MT SOLN
15.0000 mL | Freq: Once | OROMUCOSAL | Status: AC
  Administered 2022-09-25: 15 mL via OROMUCOSAL
  Filled 2022-09-25: qty 15

## 2022-09-25 SURGICAL SUPPLY — 33 items
ANTIFOG SOL W/FOAM PAD STRL (MISCELLANEOUS) ×2
BAG COUNTER SPONGE SURGICOUNT (BAG) ×3 IMPLANT
BAG SPNG CNTER NS LX DISP (BAG) ×2
BLADE ROTATE TRICUT 4X13 M4 (BLADE) ×3 IMPLANT
BLADE SHAVER TURBINATE 11X2.9 (BLADE) IMPLANT
CANISTER SUCT 3000ML PPV (MISCELLANEOUS) ×6 IMPLANT
ELECT REM PT RETURN 9FT ADLT (ELECTROSURGICAL) ×2
ELECTRODE REM PT RTRN 9FT ADLT (ELECTROSURGICAL) ×3 IMPLANT
FILTER ARTHROSCOPY CONVERTOR (FILTER) ×3 IMPLANT
GLOVE BIO SURGEON STRL SZ 6.5 (GLOVE) ×3 IMPLANT
HEMOSTAT ARISTA ABSORB 3G PWDR (HEMOSTASIS) IMPLANT
KIT BASIN OR (CUSTOM PROCEDURE TRAY) ×3 IMPLANT
KIT TURNOVER KIT B (KITS) ×3 IMPLANT
NDL HYPO 25GX1X1/2 BEV (NEEDLE) ×6 IMPLANT
NDL SPNL 25GX3.5 QUINCKE BL (NEEDLE) ×3 IMPLANT
NEEDLE HYPO 25GX1X1/2 BEV (NEEDLE) ×4 IMPLANT
NEEDLE SPNL 25GX3.5 QUINCKE BL (NEEDLE) ×2 IMPLANT
NS IRRIG 1000ML POUR BTL (IV SOLUTION) ×3 IMPLANT
PAD ARMBOARD 7.5X6 YLW CONV (MISCELLANEOUS) ×6 IMPLANT
PATTIES SURGICAL .5 X3 (DISPOSABLE) ×3 IMPLANT
SOL ANTI FOG 6CC (MISCELLANEOUS) ×3 IMPLANT
SOLUTION ANTFG W/FOAM PAD STRL (MISCELLANEOUS) IMPLANT
SPLINT NASAL POSISEP X .6X2 (GAUZE/BANDAGES/DRESSINGS) ×3 IMPLANT
SWAB COLLECTION DEVICE MRSA (MISCELLANEOUS) IMPLANT
SWAB CULTURE ESWAB REG 1ML (MISCELLANEOUS) IMPLANT
SYR CONTROL 10ML LL (SYRINGE) IMPLANT
SYR TB 1ML LUER SLIP (SYRINGE) ×6 IMPLANT
TOWEL GREEN STERILE FF (TOWEL DISPOSABLE) ×3 IMPLANT
TRACKER ENT INSTRUMENT (MISCELLANEOUS) ×6 IMPLANT
TRACKER ENT PATIENT (MISCELLANEOUS) ×3 IMPLANT
TRAY ENT MC OR (CUSTOM PROCEDURE TRAY) ×3 IMPLANT
TUBE CONNECTING 12X1/4 (SUCTIONS) ×3 IMPLANT
TUBING STRAIGHTSHOT EPS 5PK (TUBING) ×3 IMPLANT

## 2022-09-25 NOTE — Progress Notes (Addendum)
Hypoglycemic Event  CBG: 60  Treatment: D50 25 mL (12.5 gm)  Symptoms: None  Follow-up CBG: Time: 1000  CBG Result:  137  Possible Reasons for Event: Inadequate meal intake  Comments/MD notified:Will notify anesthesiologist     ,  Consuella Lose

## 2022-09-25 NOTE — H&P (Signed)
Mary Herman is an 63 y.o. female.    Chief Complaint:  Chronic sinusitis  HPI: Patient presents today for planned elective procedure.  She denies any interval change in history since office visit on 07/22/2022:  Mary Herman is a 62 y.o. female who presents as a return patient for follow-up of chronic right maxillary sinusitis. Patient was last seen in our office on 06/15/2022, and nasal endoscopy at that time demonstrated fulminant purulent drainage from the right maxillary sinus. Patient was prescribed course of clindamycin and prednisone, and presents today for posttreatment CT scan. Patient states that symptoms have been relatively stable, with no significant improvement in right facial pain and pressure. She has tried to use the nasal saline rinses for the last several days, and has noted some clearing of her nasal secretions.   Past Medical History:  Diagnosis Date   Anemia    Chronic back pain    Depression    Diabetes mellitus without complication (HCC)    takes on sundays - semaglutide   Dyspnea    with activity   Environmental allergies    Family history of adverse reaction to anesthesia    daughter- n/v       GERD (gastroesophageal reflux disease)    Hematuria    History of cystitis    INTERSTITIAL CYSTITIS--  NO ISSUES FOR YRS   History of gastroesophageal reflux (GERD)    DENIES ISSUES SINCE GASTRIC BY PASS   History of hypertension    NO ISSUE SINCE WT LOSS AFTER GASTRIC BYPASS   History of kidney stones    surgery to remove   History of obstructive sleep apnea    USED CPAP--  NO ISSUE SINCE WT LOSS AFTER GASTRIC BYPASS   Hypertension    Hypothyroidism    Pneumonia    x 1   Pre-diabetes 06/2021   Rheumatoid arthritis (HCC)    Right ureteral stone    Sleep apnea    does not use cpap   Trochanteric bursitis of both hips    Tuberculosis    patient denies this dx as of 09/09/22    Past Surgical History:  Procedure Laterality Date   CARDIAC  CATHETERIZATION  12-07-2006  [facility]   NORMAL CORONARIES ARTERIES/ NORMAL LVF/ MILD INCREASED END-DIASTOLIC PRESSURE   CESAREAN SECTION  1998   CHOLECYSTECTOMY  1980'S   CYSTOSCOPY WITH RETROGRADE PYELOGRAM, URETEROSCOPY AND STENT PLACEMENT Bilateral 12/26/2012   Procedure: cystoscopy with bilateral retrogrades, bilateral ureteroscopy ;  Surgeon: Valetta Fuller, MD;  Location[facility];  Service: Urology;  Laterality: Bilateral;   CYSTOSCOPY WITH STENT PLACEMENT Bilateral 12/26/2012   Procedure: CYSTOSCOPY WITH STENT PLACEMENT;  Surgeon: Valetta Fuller, MD;  Location: [facility];  Service: Urology;  Laterality: Bilateral;   CYSTOSCOPY/ HYDRODISTENTION  YRS AGO   DILATATION & CURETTAGE/HYSTEROSCOPY WITH MYOSURE N/A 11/25/2018   Procedure: DILATATION & CURETTAGE/HYSTEROSCOPY WITH MYOSURE;  Surgeon: Dara Lords, MD;  Location: [facility] OR;  Service: Gynecology;  Laterality: N/A;   HERNIA REPAIR     HOLMIUM LASER APPLICATION Right 12/26/2012   Procedure: HOLMIUM LASER APPLICATION;  Surgeon: Valetta Fuller, MD;  Location: [facility];  Service: Urology;  Laterality: Right;   KNEE ARTHROSCOPY Left 1992   ROBOTIC ASSISTED TOTAL HYSTERECTOMY WITH BILATERAL SALPINGO OOPHERECTOMY N/A 01/31/2019   Procedure: XI ROBOTIC ASSISTED TOTAL HYSTERECTOMY WITH BILATERAL SALPINGO OOPHORECTOMY;  Surgeon: Adolphus Birchwood, MD;  Location: [facility];  Service: Gynecology;  Laterality: N/A;  ROUX-EN-Y PROCEDURE  07/25/2007   SHOULDER ARTHROSCOPY Right 2011   TMJ ARTHROPLASTY  1990'S   BILATERAL UPPER   TOTAL ABDOMINAL HYSTERECTOMY  01/31/2019    Family History  Problem Relation Age of Onset   Hypertension Mother    Stroke Mother    Hypertension Brother    Heart disease Maternal Uncle    Diabetes Maternal Grandmother    Cancer Maternal Grandmother        THROAT CA   Cancer Maternal Grandfather        THROAT CA    Social History:  reports that she has never  smoked. She has been exposed to tobacco smoke. She has never used smokeless tobacco. She reports that she does not drink alcohol and does not use drugs.  Allergies:  Allergies  Allergen Reactions   Codeine Rash    Tolerates hydrocodone    Methotrexate Rash    Medications Prior to Admission  Medication Sig Dispense Refill   Adalimumab-adaz (HYRIMOZ) 40 MG/0.4ML SOAJ Inject 0.61ml Subcutaneous Every other week for 30 days     buPROPion (WELLBUTRIN XL) 300 MG 24 hr tablet TAKE 1 TABLET BY MOUTH EVERY DAY 90 tablet 3   celecoxib (CELEBREX) 200 MG capsule TAKE 1 CAPSULE BY MOUTH TWICE A DAY 180 capsule 1   citalopram (CELEXA) 20 MG tablet TAKE 1 TABLET BY MOUTH EVERY DAY IN THE MORNING 90 tablet 1   cyclobenzaprine (FLEXERIL) 10 MG tablet TAKE 1 TABLET BY MOUTH EVERYDAY AT BEDTIME 90 tablet 1   diazepam (VALIUM) 5 MG tablet TAKE 1 TABLET BY MOUTH EVERY DAY AT BEDTIME AS NEEDED 90 tablet 1   folic acid (FOLVITE) 1 MG tablet Take 1 mg by mouth daily.     HYDROcodone-acetaminophen (NORCO/VICODIN) 5-325 MG tablet Take 1 tablet by mouth every 6 (six) hours as needed for moderate pain. 30 tablet 0   leflunomide (ARAVA) 20 MG tablet Take 20 mg by mouth daily.     levocetirizine (XYZAL) 5 MG tablet Take 5 mg by mouth daily as needed for allergies.     levothyroxine (SYNTHROID) 75 MCG tablet TAKE 1 TABLET BY MOUTH EVERY DAY (Patient taking differently: at bedtime.) 90 tablet 1   Melatonin 10 MG CAPS Take 10 mg by mouth at bedtime.     metoprolol succinate (TOPROL-XL) 25 MG 24 hr tablet TAKE 1 TABLET BY MOUTH EVERY DAY 90 tablet 3   montelukast (SINGULAIR) 10 MG tablet TAKE 1 TABLET BY MOUTH EVERYDAY AT BEDTIME 90 tablet 3   rosuvastatin (CRESTOR) 5 MG tablet TAKE 1 TABLET BY MOUTH THREE TIMES A WEEK. 36 tablet 1   Semaglutide, 2 MG/DOSE, (OZEMPIC, 2 MG/DOSE,) 8 MG/3ML SOPN Inject 2 mg into the skin once a week. (Patient taking differently: Inject 2 mg into the skin once a week. Takes on Sunday) 3 mL 11    Vitamin D, Ergocalciferol, (DRISDOL) 1.25 MG (50000 UNIT) CAPS capsule TAKE 1 CAPSULE BY MOUTH ONE TIME PER WEEK 12 capsule 3   ACCU-CHEK GUIDE test strip USE TO TEST ONCE DAILY 50 strip PRN   Accu-Chek Softclix Lancets lancets 1 each daily.     clotrimazole-betamethasone (LOTRISONE) cream APPLY TO AFFECTED AREA TWICE A DAY (Patient taking differently: Apply 1 Application topically daily as needed (yeast).) 30 g prn   OVER THE COUNTER MEDICATION Take 1 tablet by mouth at bedtime. Bariatric Iron      Results for orders placed or performed during the hospital encounter of 09/25/22 (from the past 48 hour(s))  Glucose, capillary     Status: Abnormal   Collection Time: 09/25/22  7:40 AM  Result Value Ref Range   Glucose-Capillary 102 (H) 70 - 99 mg/dL    Comment: Glucose reference range applies only to samples taken after fasting for at least 8 hours.  Basic metabolic panel per protocol     Status: Abnormal   Collection Time: 09/25/22  8:03 AM  Result Value Ref Range   Sodium 138 135 - 145 mmol/L   Potassium 3.9 3.5 - 5.1 mmol/L   Chloride 106 98 - 111 mmol/L   CO2 19 (L) 22 - 32 mmol/L   Glucose, Bld 103 (H) 70 - 99 mg/dL    Comment: Glucose reference range applies only to samples taken after fasting for at least 8 hours.   BUN 11 6 - 20 mg/dL   Creatinine, Ser 8.11 0.44 - 1.00 mg/dL   Calcium 8.6 (L) 8.9 - 10.3 mg/dL   GFR, Estimated >91 >47 mL/min    Comment: (NOTE) Calculated using the CKD-EPI Creatinine Equation (2021)    Anion gap 13 5 - 15    Comment: Performed at [facility], [address] [place], [place] [address]  CBC per protocol     Status: Abnormal   Collection Time: 09/25/22  8:03 AM  Result Value Ref Range   WBC 3.5 (L) 4.0 - 10.5 K/uL   RBC 4.50 3.87 - 5.11 MIL/uL   Hemoglobin 13.0 12.0 - 15.0 g/dL   HCT 21.3 08.6 - 57.8 %   MCV 92.0 80.0 - 100.0 fL   MCH 28.9 26.0 - 34.0 pg   MCHC 31.4 30.0 - 36.0 g/dL   RDW 46.9 62.9 - 52.8 %   Platelets 163 150 -  400 K/uL   nRBC 0.0 0.0 - 0.2 %    Comment: Performed at [facility], [address], [place], [place] [address]  Glucose, capillary     Status: Abnormal   Collection Time: 09/25/22  9:40 AM  Result Value Ref Range   Glucose-Capillary 60 (L) 70 - 99 mg/dL    Comment: Glucose reference range applies only to samples taken after fasting for at least 8 hours.   No results found.  ROS: ROS  Blood pressure (!) 138/95, pulse (!) 114, temperature 98.2 F (36.8 C), temperature source Oral, resp. rate 18, height 5\' 2"  (1.575 m), weight 95.3 kg, last menstrual period 05/25/2014, SpO2 98%.  PHYSICAL EXAM: Physical Exam Constitutional:      Appearance: Normal appearance.  HENT:     Right Ear: External ear normal.     Left Ear: External ear normal.     Mouth/Throat:     Mouth: Mucous membranes are moist.  Pulmonary:     Effort: Pulmonary effort is normal.  Neurological:     General: No focal deficit present.     Mental Status: She is alert. Mental status is at baseline.  Psychiatric:        Mood and Affect: Mood normal.        Thought Content: Thought content normal.     Studies Reviewed: CT Sinus   Assessment/Plan Zoelle Markus is a 62 y.o. female with history of chronic sinusitis.  -To OR for right maxillary antrostomy and anterior ethmoidectomy as well as bilateral inferior turbinate reduction. Risks of surgery, including bleeding, leakage of cerebral spinal fluid, eye injury, injury to the tear duct system causing excessive tearing, numbness of the upper teeth and gums, damage to  the olfactory nerves causing loss of smell, excessive crust formation after the operation, were reviewed with patient. All questions were answered.      A  09/25/2022, 9:56 AM

## 2022-09-25 NOTE — Op Note (Signed)
OPERATIVE NOTE  Mary Herman Date/Time of Admission: 09/25/2022  7:16 AM  CSN: [id number];MRN:[id number] Attending Provider: Cheron Schaumann A, DO Room/Bed: MCPO/NONE DOB: 04-22-1960 Age: 62 y.o.   Pre-Op Diagnosis: Chronic maxillary sinusitis Hypertrophy of inferior nasal turbinate  Post-Op Diagnosis: Chronic maxillary sinusitis Hypertrophy of inferior nasal turbinate  Procedure: Procedure(s): FUNCTIONAL ENDOSCOPIC SINUS WITH FUSION BILATERAL INFERIOR TURBINATE REDUCTION/SUBMUCOSAL RESECTION RIGHT MAXILLARY ANTROSTOMY WITH TISSUE REMOVAL RIGHT ANTERIOR ETHMOIDECTOMY  Anesthesia: General  Surgeon(s):  A , DO  Staff: Circulator: Rogers Seeds, RN Scrub Person: Perez-Vasquez, Tiffany Circulator Assistant: Virgel Bouquet, RN Float Surgical Tech: Madilyn Fireman, Amy E  Implants: * No implants in log *  Specimens: ID Type Source Tests Collected by Time Destination  1 : Right Sinus Contents Tissue PATH Sinus Contents/Nasal Polyps SURGICAL PATHOLOGY ,  A, DO 09/25/2022 1033   A : Right Maxillary Sinus Tissue PATH Sinus Contents/Nasal Polyps AEROBIC/ANAEROBIC CULTURE W GRAM STAIN (SURGICAL/DEEP WOUND) ,  A, DO 09/25/2022 1039     Complications: None  EBL: 20 ML  Condition: stable  Operative Findings:  Frank purulence filling right maxillary sinus with extensive osteitic change consistent with long standing chronic sinusitis. Moderate mucosal edema, bilateral inferior turbinate hypertrophy  Description of Operation: Once operative consent was obtained and the site and surgery were confirmed with the patient and the operating room team, the patient was brought back to the operating room and general endotracheal anesthesia was obtained. The patient was turned over to the ENT service, at which time the image-guided system was attached and noted to be in good calibration. Lidocaine 1% with 1:100,000 epinephrine was injected into the right  middle turbinate, and the axilla between the medial turbinate and the lateral nasal wall. Adrenaline-soaked pledgets were placed into the nasal cavity, and the patient was prepped and draped in sterile fashion. Attention turned to the right-sided sinonasal cavity. The middle turbinate was medialized and a ball-tipped seeker was used to slightly anterior fracture the uncinate process. Copious purulence was encountered and cultured. A ball-tipped seeker was used to identify the natural os of the maxillary sinus, and it was widened with combination of the backbiter, the microdebrider, the olive tip suction, and the straight TruCut until it was widely patent. The sinus was copiously irrigated until no further purulence was encountered. A 70 degree scope was used to ensure patency of the true ostium. Utilizing image-guided suction, the medial inferior quadrant of the ethmoid bulla was entered bluntly, the walls were fractured and the bulla was removed utilizing a microdebrider. Anterior ethmoidectomy was completed in a standard fashion by first locating the basal lamella of the middle turbinate and then following the skull base and the lamina papyracea to fully take down all anterior ethmoid cells. This was done utilizing a combination of the straight suction, the J curette, the ball-tipped seeker, and the microdebrider. Attention was then turned to the inferior turbinates bilaterally. They were outfractured and then submucous resection was performed by making an incision in the leading edge with a 15 blade, separating the mucosa from bone with a Cottle elevator and then using the micro debrider via a turbinate blade to remove bone. Copious irrigation was placed in the sinonasal cavities and Arista and Posisep absorbable nasal pack was placed lateral to the middle turbinate in the axilla between it and the lateral nasal wall. An orogastric tube was placed and the stomach cavity was suctioned to reduce postoperative  nausea. The patient was turned over to anesthesia service and was extubated in  the operating room and transferred to the PACU in stable condition. The patient will be discharged today and followed up in the ENT clinic in 1 week for postoperative check.   Laren Boom, DO [facility]  09/25/2022

## 2022-09-25 NOTE — Discharge Instructions (Signed)
[facility] SINUS SURGERY ([facility]) Post Operative Instructions  Office: [phone]  The Surgery Itself Endoscopic sinus surgery (with or without septoplasty and turbinate reduction) involves general anesthesia, typically for one to two hours. Patients may be sedated for several hours after surgery and may remain sleepy for the better part of the day. Nausea and vomiting are occasionally seen, and usually resolve by the evening of surgery - even without additional medications. Almost all patients can go home the day of surgery.  After Surgery  Facial pressure and fullness similar to a sinus infection/headache is normal after surgery. Breathing through your nose is also difficult due to swelling. A humidifier or vaporizer can be used in the bedroom to prevent throat pain with mouth breathing.   Bloody nasal drainage is normal after this surgery for 5-7 days, usually decreasing in volume with each day that passes. Drainage will flow from the front of the nose and down the back of the throat. Make sure you spit out blood drainage that drips down the back of your throat to prevent nausea/vomiting. You will have a nasal drip pad/sling with gauze to catch drainage from the front of your nose. The dressing may need to be changed frequently during the first 24 hours following surgery. In case of profuse nasal bleeding, you may apply ice to the bridge of the nose and pinch the nose just above the tip and hold for 10 minutes; if bleeding continues, contact the doctors office.   Frequent hot showers or saline nasal rinses (NeilMed) will help break up congestion and clear any clot or mucus that builds up within the nose after surgery. This can be started the day after surgery.   It is more comfortable to sleep with extra pillows or in a recliner for the first few days after surgery until the drainage begins to resolve.    Do not blow your nose for 2 weeks after surgery.   Avoid lifting > 10 lbs. and no  vigorous exercise for 2 weeks after surgery.   Avoid airplane travel for 2 weeks following sinus surgery; the cabin pressure changes can cause pain and swelling within the nose/sinuses.   Sense of smell and taste are often diminished for several weeks after surgery. There may be some tenderness or numbness in your upper front teeth, which is normal after surgery. You may express old clot, discolored mucus or very large nasal crusts from your nose for up to 3-4 weeks after surgery; depending on how frequently and how effectively you irrigate your nose with the saltwater spray.   You may have absorbable sutures inside of your nose after surgery that will slowly dissolve in 2-3 weeks. Be careful when clearing crusts from the nose since they may be attached to these sutures.  Medications  Pain medication can be used for pain as prescribed. Pain and pressure in the nose is expected after surgery. As the surgical site heals, pain will resolve over the course of a week. Pain medications can cause nausea, which can be prevented if you take them with food or milk.   You may be given an antibiotic for one week after surgery to prevent infection. Take this medication with food to prevent nausea or vomiting.   You can use 2 nasal sprays after surgery: Afrin can be used up to 2 times a day for up to 5 days after surgery (best before bed) to reduce bloody drainage from the nose for the first few days after surgery. Saline/salt   water spray should be used at least 4-6 times per day, starting the day after surgery to prevent crusting inside of the nose.   Take all of your routine medications as prescribed, unless told otherwise by your surgeon. Any medications that thin the blood should be avoided. This includes aspirin. Avoid aspirin-like products for the first 72 hours after surgery (Advil, Motrin, Excedrin, Alieve, Celebrex, Naprosyn), but you may use them as needed for pain after 72 hours.   

## 2022-09-25 NOTE — Transfer of Care (Signed)
Immediate Anesthesia Transfer of Care Note  Patient: Mary Herman  Procedure(s) Performed: FUNCTIONAL ENDOSCOPIC SINUS WITH FUSION (Right: Nose) BILATERAL INFERIOR TURBINATE REDUCTION/SUBMUCOSAL RESECTION (Bilateral: Nose) RIGHT MAXILLARY ANTROSTOMY (Right: Nose) RIGHT ANTERIOR ETHMOIDECTOMY (Right: Nose)  Patient Location: PACU  Anesthesia Type:General  Level of Consciousness: awake, alert , and oriented  Airway & Oxygen Therapy: Patient Spontanous Breathing  Post-op Assessment: Report given to RN and Post -op Vital signs reviewed and stable  Post vital signs: Reviewed and stable  Last Vitals:  Vitals Value Taken Time  BP 173/94 09/25/22 1126  Temp    Pulse 88 09/25/22 1127  Resp 17 09/25/22 1127  SpO2 96 % 09/25/22 1127  Vitals shown include unfiled device data.  Last Pain:  Vitals:   09/25/22 0755  TempSrc:   PainSc: 3       Patients Stated Pain Goal: 3 (09/25/22 0755)  Complications: No notable events documented.

## 2022-09-25 NOTE — Anesthesia Procedure Notes (Signed)
Procedure Name: Intubation Date/Time: 09/25/2022 10:20 AM  Performed by: Garfield Cornea, CRNAPre-anesthesia Checklist: Patient identified, Emergency Drugs available, Suction available and Patient being monitored Patient Re-evaluated:Patient Re-evaluated prior to induction Oxygen Delivery Method: Circle System Utilized Preoxygenation: Pre-oxygenation with 100% oxygen Induction Type: IV induction Ventilation: Mask ventilation without difficulty and Oral airway inserted - appropriate to patient size Laryngoscope Size: Mac and 3 Grade View: Grade I Tube type: Oral Tube size: 7.0 mm Number of attempts: 1 Airway Equipment and Method: Stylet and Oral airway Placement Confirmation: ETT inserted through vocal cords under direct vision, positive ETCO2 and breath sounds checked- equal and bilateral Secured at: 22 cm Tube secured with: Tape Dental Injury: Teeth and Oropharynx as per pre-operative assessment  Comments: Intubation performed by Clifton Custard, paramedic student with supervision by MDA and CRNA.

## 2022-09-27 ENCOUNTER — Encounter (HOSPITAL_COMMUNITY): Payer: Self-pay | Admitting: Otolaryngology

## 2022-09-28 NOTE — Anesthesia Postprocedure Evaluation (Signed)
Anesthesia Post Note  Patient: Mary Herman  Procedure(s) Performed: FUNCTIONAL ENDOSCOPIC SINUS WITH FUSION (Right: Nose) BILATERAL INFERIOR TURBINATE REDUCTION/SUBMUCOSAL RESECTION (Bilateral: Nose) RIGHT MAXILLARY ANTROSTOMY (Right: Nose) RIGHT ANTERIOR ETHMOIDECTOMY (Right: Nose)     Patient location during evaluation: PACU Anesthesia Type: General Level of consciousness: awake and alert Pain management: pain level controlled Vital Signs Assessment: post-procedure vital signs reviewed and stable Respiratory status: spontaneous breathing, nonlabored ventilation, respiratory function stable and patient connected to nasal cannula oxygen Cardiovascular status: blood pressure returned to baseline and stable Postop Assessment: no apparent nausea or vomiting Anesthetic complications: no   No notable events documented.  Last Vitals:  Vitals:   09/25/22 1145 09/25/22 1200  BP: (!) 158/103 (!) 145/103  Pulse: 87 89  Resp: 16 17  Temp:  36.4 C  SpO2: 95% 92%    Last Pain:  Vitals:   09/25/22 1145  TempSrc:   PainSc: 4    Pain Goal: Patients Stated Pain Goal: 3 (09/25/22 0755)                 Shelton Silvas

## 2022-10-05 ENCOUNTER — Other Ambulatory Visit: Payer: Self-pay | Admitting: Internal Medicine

## 2022-10-12 ENCOUNTER — Other Ambulatory Visit: Payer: Self-pay | Admitting: Internal Medicine

## 2022-10-12 DIAGNOSIS — R8290 Unspecified abnormal findings in urine: Secondary | ICD-10-CM

## 2022-10-28 ENCOUNTER — Other Ambulatory Visit: Payer: Self-pay

## 2022-10-29 ENCOUNTER — Other Ambulatory Visit (HOSPITAL_COMMUNITY): Payer: Self-pay

## 2022-10-30 ENCOUNTER — Other Ambulatory Visit: Payer: Self-pay | Admitting: Family

## 2022-11-02 ENCOUNTER — Other Ambulatory Visit (HOSPITAL_COMMUNITY): Payer: Self-pay

## 2022-11-02 MED ORDER — HYDROCODONE-ACETAMINOPHEN 5-325 MG PO TABS
0.5000 | ORAL_TABLET | Freq: Every day | ORAL | 0 refills | Status: DC
  Filled 2022-11-02: qty 25, 30d supply, fill #0

## 2022-11-03 ENCOUNTER — Other Ambulatory Visit (HOSPITAL_COMMUNITY): Payer: Self-pay

## 2022-11-11 ENCOUNTER — Ambulatory Visit: Payer: No Typology Code available for payment source

## 2022-11-11 ENCOUNTER — Other Ambulatory Visit: Payer: Self-pay | Admitting: Pharmacist Clinician (PhC)/ Clinical Pharmacy Specialist

## 2022-11-11 ENCOUNTER — Ambulatory Visit: Payer: No Typology Code available for payment source | Attending: Cardiology | Admitting: Internal Medicine

## 2022-11-11 ENCOUNTER — Encounter: Payer: Self-pay | Admitting: Internal Medicine

## 2022-11-11 VITALS — BP 138/90 | HR 103 | Ht 63.0 in | Wt 207.8 lb

## 2022-11-11 DIAGNOSIS — R9431 Abnormal electrocardiogram [ECG] [EKG]: Secondary | ICD-10-CM

## 2022-11-11 MED ORDER — OZEMPIC (2 MG/DOSE) 8 MG/3ML ~~LOC~~ SOPN: 2.0000 mg | PEN_INJECTOR | SUBCUTANEOUS | 2 refills | Status: DC

## 2022-11-11 NOTE — Patient Instructions (Addendum)
Medication Instructions:  Your physician recommends that you continue on your current medications as directed. Please refer to the Current Medication list given to you today.    *If you need a refill on your cardiac medications before your next appointment, please call your pharmacy*     Follow-Up: At [facility], you and your health needs are our priority.  As part of our continuing mission to provide you with exceptional heart care, we have created designated Provider Care Teams.  These Care Teams include your primary Cardiologist (physician) and Advanced Practice Providers (APPs -  Physician Assistants and Nurse Practitioners) who all work together to provide you with the care you need, when you need it.  We recommend signing up for the patient portal called "MyChart".  Sign up information is provided on this After Visit Summary.  MyChart is used to connect with patients for Virtual Visits (Telemedicine).  Patients are able to view lab/test results, encounter notes, upcoming appointments, etc.  Non-urgent messages can be sent to your provider as well.   To learn more about what you can do with MyChart, go to [email or web address].    Your next appointment:   1 year(s)  The format for your next appointment:   In Person  Provider:   Maisie Fus, MD    Other Instructions Dr. Wyline Mood has referred you to [facility] for weight loss management.

## 2022-11-11 NOTE — Progress Notes (Signed)
Cardiology Office Note:    Date:  11/11/2022   ID:  Mary Herman, DOB [birth year], MRN [id number]  PCP:  Margaree Mackintosh, MD   [facility] Providers Cardiologist:  None {   Referring MD: Margaree Mackintosh, MD    History of Present Illness:    Mary Herman is a 62 y.o. female with a hx of hypothyroidism, RA, depression, and prior gastric bypass who was referred by Dr. Lenord Fellers for further evaluation of abnormal ECG.  On 08/20/2021 her ECG showed sinus tachycardia, low voltage in precordial leads, old inferior infarct.  Today, the patient states that she overall feels okay. She has chronic low energy and is DOE which is stable. She mostly attributes her symptoms to her weight and deconditioning.  She notes that she was diagnosed with diabetes about 3 months ago. She was started on Januvia. Since her diagnosis she has stopped drinking sweet tea and has noticed some weight loss. She admits that her diet is not the best as she does not enjoy cooking. Frequently she may have frozen dinners such as lasagna or macaroni and beef. Previously she underwent bariatric surgery and had lost 115 lbs. She is trying to work on weight loss again. ments.   In her family, her mother had a brain aneurysm. She has a great uncle who had heart issues. One of her siblings has been on antihypertensives for a long time. No other known family history of heart disease.  She denies any palpitations, chest pain, or peripheral edema. No headaches, syncope, orthopnea, or PND.  Interval hx 11/11/2022 She lost a lot of weight on ozempic. She was 286 10/22/2021, now it is 207. Her knee limits her activity level. She would like to determine if she can get free samples of ozempic , her husband was able to. He is on disability. She will be losing her job this year.  Past Medical History:  Diagnosis Date   Anemia    Chronic back pain    Depression    Diabetes mellitus without complication (HCC)    takes on sundays  - semaglutide   Dyspnea    with activity   Environmental allergies    Family history of adverse reaction to anesthesia    daughter- n/v       GERD (gastroesophageal reflux disease)    Hematuria    History of cystitis    INTERSTITIAL CYSTITIS--  NO ISSUES FOR YRS   History of gastroesophageal reflux (GERD)    DENIES ISSUES SINCE GASTRIC BY PASS   History of hypertension    NO ISSUE SINCE WT LOSS AFTER GASTRIC BYPASS   History of kidney stones    surgery to remove   History of obstructive sleep apnea    USED CPAP--  NO ISSUE SINCE WT LOSS AFTER GASTRIC BYPASS   Hypertension    Hypothyroidism    Pneumonia    x 1   Pre-diabetes 06/2021   Rheumatoid arthritis (HCC)    Right ureteral stone    Sleep apnea    does not use cpap   Trochanteric bursitis of both hips    Tuberculosis    patient denies this dx as of 09/09/22    Past Surgical History:  Procedure Laterality Date   CARDIAC CATHETERIZATION  12-07-2006  [facility]   NORMAL CORONARIES ARTERIES/ NORMAL LVF/ MILD INCREASED END-DIASTOLIC PRESSURE   CESAREAN SECTION  1998   CHOLECYSTECTOMY  1980'S   CYSTOSCOPY WITH RETROGRADE PYELOGRAM, URETEROSCOPY AND STENT  PLACEMENT Bilateral 12/26/2012   Procedure: cystoscopy with bilateral retrogrades, bilateral ureteroscopy ;  Surgeon: Valetta Fuller, MD;  Location: [facility];  Service: Urology;  Laterality: Bilateral;   CYSTOSCOPY WITH STENT PLACEMENT Bilateral 12/26/2012   Procedure: CYSTOSCOPY WITH STENT PLACEMENT;  Surgeon: Valetta Fuller, MD;  Location: [facility];  Service: Urology;  Laterality: Bilateral;   CYSTOSCOPY/ HYDRODISTENTION  YRS AGO   DILATATION & CURETTAGE/HYSTEROSCOPY WITH MYOSURE N/A 11/25/2018   Procedure: DILATATION & CURETTAGE/HYSTEROSCOPY WITH MYOSURE;  Surgeon: Dara Lords, MD;  Location: [facility];  Service: Gynecology;  Laterality: N/A;   ETHMOIDECTOMY Right 09/25/2022   Procedure: RIGHT ANTERIOR ETHMOIDECTOMY;  Surgeon:  Laren Boom, DO;  Location: [facility];  Service: ENT;  Laterality: Right;   HERNIA REPAIR     HOLMIUM LASER APPLICATION Right 12/26/2012   Procedure: HOLMIUM LASER APPLICATION;  Surgeon: Valetta Fuller, MD;  [facility];  Service: Urology;  Laterality: Right;   KNEE ARTHROSCOPY Left 1992   MAXILLARY ANTROSTOMY Right 09/25/2022   Procedure: RIGHT MAXILLARY ANTROSTOMY;  Surgeon: Laren Boom, DO;  Location: [facility];  Service: ENT;  Laterality: Right;   NASAL TURBINATE REDUCTION Bilateral 09/25/2022   Procedure: BILATERAL INFERIOR TURBINATE REDUCTION/SUBMUCOSAL RESECTION;  Surgeon: Laren Boom, DO;  Location: [facility];  Service: ENT;  Laterality: Bilateral;   ROBOTIC ASSISTED TOTAL HYSTERECTOMY WITH BILATERAL SALPINGO OOPHERECTOMY N/A 01/31/2019   Procedure: XI ROBOTIC ASSISTED TOTAL HYSTERECTOMY WITH BILATERAL SALPINGO OOPHORECTOMY;  Surgeon: Adolphus Birchwood, MD;  Location: [facility];  Service: Gynecology;  Laterality: N/A;   ROUX-EN-Y PROCEDURE  07/25/2007   SHOULDER ARTHROSCOPY Right 2011   SINUS ENDO WITH FUSION Right 09/25/2022   Procedure: FUNCTIONAL ENDOSCOPIC SINUS WITH FUSION;  Surgeon: Laren Boom, DO;  Location: [facility];  Service: ENT;  Laterality: Right;   TMJ ARTHROPLASTY  1990'S   BILATERAL UPPER   TOTAL ABDOMINAL HYSTERECTOMY  01/31/2019    Current Medications: Current Outpatient Medications on File Prior to Visit  Medication Sig Dispense Refill   ACCU-CHEK GUIDE test strip USE TO TEST ONCE DAILY 50 strip PRN   Accu-Chek Softclix Lancets lancets 1 each daily.     Adalimumab-adaz (HYRIMOZ) 40 MG/0.4ML SOAJ Inject 0.86ml Subcutaneous Every other week for 30 days     buPROPion (WELLBUTRIN XL) 300 MG 24 hr tablet TAKE 1 TABLET BY MOUTH EVERY DAY 90 tablet 3   celecoxib (CELEBREX) 200 MG capsule TAKE 1 CAPSULE BY MOUTH TWICE A DAY 180 capsule 1   citalopram (CELEXA) 20 MG tablet TAKE 1 TABLET BY MOUTH EVERY DAY IN THE MORNING 90 tablet 1    clotrimazole-betamethasone (LOTRISONE) cream APPLY TO AFFECTED AREA TWICE A DAY (Patient taking differently: Apply 1 Application topically daily as needed (yeast).) 30 g prn   cyclobenzaprine (FLEXERIL) 10 MG tablet TAKE 1 TABLET BY MOUTH EVERYDAY AT BEDTIME 90 tablet 1   diazepam (VALIUM) 5 MG tablet TAKE 1 TABLET BY MOUTH EVERY DAY AT BEDTIME AS NEEDED 90 tablet 1   folic acid (FOLVITE) 1 MG tablet Take 1 mg by mouth daily.     HYDROcodone-acetaminophen (NORCO/VICODIN) 5-325 MG tablet Take 1 tablet by mouth every 6 (six) hours as needed for moderate pain. 30 tablet 0   HYDROcodone-acetaminophen (NORCO/VICODIN) 5-325 MG tablet Take 0.5-1 tablets by mouth daily only as needed for pain refractory to other interventions 25 tablet 0   leflunomide (ARAVA) 20 MG tablet Take 20 mg by mouth daily.  levocetirizine (XYZAL) 5 MG tablet Take 5 mg by mouth daily as needed for allergies.     levothyroxine (SYNTHROID) 75 MCG tablet TAKE 1 TABLET BY MOUTH EVERY DAY (Patient taking differently: at bedtime.) 90 tablet 1   Melatonin 10 MG CAPS Take 10 mg by mouth at bedtime.     metoprolol succinate (TOPROL-XL) 25 MG 24 hr tablet TAKE 1 TABLET BY MOUTH EVERY DAY 90 tablet 3   montelukast (SINGULAIR) 10 MG tablet TAKE 1 TABLET BY MOUTH EVERYDAY AT BEDTIME 90 tablet 3   OVER THE COUNTER MEDICATION Take 1 tablet by mouth at bedtime. Bariatric Iron     rosuvastatin (CRESTOR) 5 MG tablet TAKE 1 TABLET BY MOUTH THREE TIMES A WEEK. 36 tablet 1   Semaglutide, 2 MG/DOSE, (OZEMPIC, 2 MG/DOSE,) 8 MG/3ML SOPN Inject 2 mg into the skin once a week. (Patient taking differently: Inject 2 mg into the skin once a week. Takes on Sunday) 3 mL 11   Vitamin D, Ergocalciferol, (DRISDOL) 1.25 MG (50000 UNIT) CAPS capsule TAKE 1 CAPSULE BY MOUTH ONE TIME PER WEEK 12 capsule 3   No current facility-administered medications on file prior to visit.     Allergies:   Codeine and Methotrexate   Social History   Socioeconomic History    Marital status: Married    Spouse name: Not on file   Number of children: Not on file   Years of education: Not on file   Highest education level: Not on file  Occupational History   Not on file  Tobacco Use   Smoking status: Never    Passive exposure: Past   Smokeless tobacco: Never  Vaping Use   Vaping status: Never Used  Substance and Sexual Activity   Alcohol use: No   Drug use: No   Sexual activity: Not Currently    Partners: Male    Birth control/protection: Surgical    Comment: Hysterectomy; husband vasectomy-1st intercourse 26 yo-Fewer than 5 partners  Other Topics Concern   Not on file  Social History Narrative   Not on file   Social Determinants of Health   Financial Resource Strain: Not on file  Food Insecurity: Not on file  Transportation Needs: Not on file  Physical Activity: Not on file  Stress: Not on file  Social Connections: Not on file     Family History: The patient's family history includes Cancer in her maternal grandfather and maternal grandmother; Diabetes in her maternal grandmother; Heart disease in her maternal uncle; Hypertension in her brother and mother; Stroke in her mother.  ROS:    per HPI otherwise negative    EKGs/Labs/Other Studies Reviewed:    The following studies were reviewed today:  2D Echo  11/03/2011: Findings: LVEF 65-70% Mild concentric left ventricular hypertrophy. The aortic valve is trileaflet. No aortic stenosis or regurgitation. Mild mitral regurgitation. Trace tricuspid regurgitation. Right ventricular systolic pressure is 30.57 mm Hg.  TTE 11/20/2021 EF 60-65%, RV fxn is normal no sig valve dx. No sig pulmonary HTN   PET 12/23/2021   LV perfusion is normal. There is no evidence of ischemia. There is no evidence of infarction.   Rest left ventricular function is normal. Rest EF: 66 %. Stress left ventricular function is normal. Stress EF: 73 %. End diastolic cavity size is normal. End systolic cavity size is  normal.   Myocardial blood flow was computed to be 1.56ml/g/min at rest and 4.30ml/g/min at stress. Global myocardial blood flow reserve was 2.77 and was normal.  Coronary calcium was absent on the attenuation correction CT images.   The study is normal. The study is low risk.   Electronically signed by Epifanio Lesches, MD   EKG:  EKG is personally reviewed. 10/22/2021:  Sinus tachycardia. Rate 107 bpm. LAD. Inferior infarct. Anterior infarct.   Recent Labs: 05/15/2022: ALT 26; TSH 0.50 09/25/2022: BUN 11; Creatinine, Ser 0.96; Hemoglobin 13.0; Platelets 163; Potassium 3.9; Sodium 138   Recent Lipid Panel    Component Value Date/Time   CHOL 155 05/15/2022 1043   TRIG 96 05/15/2022 1043   HDL 87 05/15/2022 1043   CHOLHDL 1.8 05/15/2022 1043   VLDL 22 02/07/2013 1211   LDLCALC 50 05/15/2022 1043     Risk Assessment/Calculations:           Physical Exam:    VS:  Vitals:   11/11/22 0906  BP: (!) 138/90  Pulse: (!) 103  SpO2: 94%     LMP 05/25/2014     Wt Readings from Last 3 Encounters:  09/25/22 210 lb (95.3 kg)  05/15/22 236 lb 1.9 oz (107.1 kg)  01/20/22 263 lb (119.3 kg)     GEN: Well nourished, well developed in no acute distress HEENT: Normal NECK: No JVD; No carotid bruits CARDIAC: RRR, no murmurs, rubs, gallops RESPIRATORY:  Clear to auscultation without rales, wheezing or rhonchi  ABDOMEN: Morbidly obese, NTTP MUSCULOSKELETAL:  No edema; No deformity  SKIN: Warm and dry NEUROLOGIC:  Alert and oriented x 3 PSYCHIATRIC:  Normal affect   ASSESSMENT:    1. Abnormal EKG    PLAN:    In order of problems listed above:  #Abnormal ECG: Patient had ECG with anterior and inferior q-waves. Has chronic dyspnea on exertion and fatigue which she relates to obesity.  TTE and PET were unremarkable ( no scar, no microvascular dx)  #HLD: -Continue crestor 5mg  daily -LDL well controlled at 63  #HTN: Typically well controlled; did not take her BP meds  today. 138/90 mmHg -Continue metop 25mg  XL daily  #Morbid Obesity w/o DM2 BMI 50. Suspect this is contributing to DOE and chronic fatigue. Had gastric bypass in the past but gained weight back. Recommend continuing GLP-1  Follow-up:  Pharmacy visit for Ozempic in 1 month FU in 1 year  Medication Adjustments/Labs and Tests Ordered: Current medicines are reviewed at length with the patient today.  Concerns regarding medicines are outlined above.   Orders Placed This Encounter  Procedures   EKG 12-Lead   No orders of the defined types were placed in this encounter.  There are no Patient Instructions on file for this visit.   I,Mathew Stumpf,acting as a Neurosurgeon for [employer], MD.,have documented all relevant documentation on the behalf of Maysa Lynn, Alben Spittle, MD,as directed by  Maisie Fus, MD while in the presence of Marilou Barnfield, Alben Spittle, MD.  Mick Sell, MD, have reviewed all documentation for this visit. The documentation on 11/11/22 for the exam, diagnosis, procedures, and orders are all accurate and complete.   Signed, Maisie Fus, MD  11/11/2022 9:02 AM    [facility]

## 2022-11-16 ENCOUNTER — Ambulatory Visit: Payer: No Typology Code available for payment source | Admitting: Orthopaedic Surgery

## 2022-11-16 ENCOUNTER — Encounter: Payer: Self-pay | Admitting: Orthopaedic Surgery

## 2022-11-16 VITALS — Ht 63.0 in | Wt 207.8 lb

## 2022-11-16 DIAGNOSIS — M1712 Unilateral primary osteoarthritis, left knee: Secondary | ICD-10-CM | POA: Diagnosis not present

## 2022-11-16 DIAGNOSIS — M25561 Pain in right knee: Secondary | ICD-10-CM | POA: Diagnosis not present

## 2022-11-16 DIAGNOSIS — G8929 Other chronic pain: Secondary | ICD-10-CM | POA: Diagnosis not present

## 2022-11-16 DIAGNOSIS — M1711 Unilateral primary osteoarthritis, right knee: Secondary | ICD-10-CM | POA: Diagnosis not present

## 2022-11-16 DIAGNOSIS — M25562 Pain in left knee: Secondary | ICD-10-CM

## 2022-11-16 MED ORDER — METHYLPREDNISOLONE ACETATE 40 MG/ML IJ SUSP
40.0000 mg | INTRAMUSCULAR | Status: AC | PRN
Start: 2022-11-16 — End: 2022-11-16
  Administered 2022-11-16: 40 mg via INTRA_ARTICULAR

## 2022-11-16 MED ORDER — LIDOCAINE HCL 1 % IJ SOLN
3.0000 mL | INTRAMUSCULAR | Status: AC | PRN
Start: 2022-11-16 — End: 2022-11-16
  Administered 2022-11-16: 3 mL

## 2022-11-16 NOTE — Progress Notes (Signed)
The patient comes in today for steroid injections in both knees.  She does this about every 3 months or so due to severe arthritis in both her knees.  She has been on weight loss journey and has lost 80 pounds.  She is work on better blood glucose control.  She did let me know that she is losing her job at the end of the year.  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  She has had no acute change in her medical status other than recent sinus surgery that has done well.  Both knees have slight varus malalignment and global tenderness.  They have well-documented severe arthritis.  There is no effusion of either knee today.  I did place a steroid injection in both knees today which she tolerated well.  She knows to watch her blood glucose closely.  We can repeat this again in 3 months.     Procedure Note  Patient: Mary Herman             Date of Birth: 1960-09-08           MRN: [id number]             Visit Date: 11/16/2022  Procedures: Visit Diagnoses:  1. Chronic pain of left knee   2. Chronic pain of right knee   3. Unilateral primary osteoarthritis, left knee   4. Unilateral primary osteoarthritis, right knee     Large Joint Inj: R knee on 11/16/2022 9:33 AM Indications: diagnostic evaluation and pain Details: 22 G 1.5 in needle, superolateral approach  Arthrogram: No  Medications: 3 mL lidocaine 1 %; 40 mg methylPREDNISolone acetate 40 MG/ML Outcome: tolerated well, no immediate complications Procedure, treatment alternatives, risks and benefits explained, specific risks discussed. Consent was given by the patient. Immediately prior to procedure a time out was called to verify the correct patient, procedure, equipment, support staff and site/side marked as required. Patient was prepped and draped in the usual sterile fashion.    Large Joint Inj: L knee on 11/16/2022 9:33 AM Indications: diagnostic evaluation and pain Details: 22 G 1.5 in  needle, superolateral approach  Arthrogram: No  Medications: 3 mL lidocaine 1 %; 40 mg methylPREDNISolone acetate 40 MG/ML Outcome: tolerated well, no immediate complications Procedure, treatment alternatives, risks and benefits explained, specific risks discussed. Consent was given by the patient. Immediately prior to procedure a time out was called to verify the correct patient, procedure, equipment, support staff and site/side marked as required. Patient was prepped and draped in the usual sterile fashion.

## 2022-11-17 NOTE — Progress Notes (Signed)
Patient Care Team: Margaree Mackintosh, MD as PCP - General (Internal Medicine) Maisie Fus, MD as PCP - Cardiology (Cardiology) Himmelrich, Loree Fee, RD (Inactive) as Dietitian  Visit Date: 11/24/22  Subjective:    Patient ID: Mary Herman , Female   DOB: [birth year], 62 y.o.    MRN: [id number]   62 y.o. Female presents today for a 6 month follow-up. Patient has a past medical history of anemia, chronic back pain, depression, dyspnea, environmental allergies, fibroid, GERD, hematuria, cystitis, hypertension, kidney stones, obstructive sleep apnea, pneumonia, pre-diabetes, rheumatoid arthritis, right ureteral stone, thyroid disease, trochanteric bursitis of both hips.   She has been feeling well regarding general health.  History of glucose intolerance. BMI at 35.43. Takes Ozempic 2 mg once weekly.    History of chronic maxillary sinusitis. Status post functional endoscopic sinus surgery with right maxillary antrostomy and anterior ethmoidectomy and bilateral inferior turbinate reduction performed on 09/25/2022 with Dr. Marene Lenz. She is using a daily antibiotic sinus rinse.   History of rheumatoid arthritis currently treated with Arava 20 mg daily. Currently on Hyrimoz 0.4 cc subcutaneous every other week for 30 days. She gets steroid injections in both knees every 3 months.   History of depression treated with Wellbutrin XL 300 mg daily, Celexa 20 mg daily in the morning.    History of hypothyroidism treated with Synthroid 75 mcg daily.   History of hyperlipidemia treated with Crestor 5 mg three times weekly.   History of hypertension treated with Toprol-XL 25 mg daily. Blood pressure normal today at 100/80. Reports blood pressure has been normal at home. Seen by cardiologist, Dr. Wyline Mood on 11/11/22 and EKG showed sinus tachycardia with anterior and inferior q-waves. History of chronic dyspnea on exertion and fatigue. Denies leg swelling.    History of chronic back pain treated with  Celebrex 200 mg twice daily, Flexeril 10 mg daily at bedtime.   Labs collected today. Awaiting results.   Pap smear last completed 05/17/17. Negative for intraepithelial lesion or malignancy.    Mammogram last completed 05/13/21. No mammographic evidence of malignancy. Recommended repeat in 2025.   DEXA scan last completed 07/07/21. T-score at -1.3 at right femoral neck. She has osteopenia. Recommended repeat in 2025.  Past Medical History:  Diagnosis Date   Anemia    Chronic back pain    Depression    Diabetes mellitus without complication (HCC)    takes on sundays - semaglutide   Dyspnea    with activity   Environmental allergies    Family history of adverse reaction to anesthesia    daughter- n/v       GERD (gastroesophageal reflux disease)    Hematuria    History of cystitis    INTERSTITIAL CYSTITIS--  NO ISSUES FOR YRS   History of gastroesophageal reflux (GERD)    DENIES ISSUES SINCE GASTRIC BY PASS   History of hypertension    NO ISSUE SINCE WT LOSS AFTER GASTRIC BYPASS   History of kidney stones    surgery to remove   History of obstructive sleep apnea    USED CPAP--  NO ISSUE SINCE WT LOSS AFTER GASTRIC BYPASS   Hypertension    Hypothyroidism    Pneumonia    x 1   Pre-diabetes 06/2021   Rheumatoid arthritis (HCC)    Right ureteral stone    Sleep apnea    does not use cpap   Trochanteric bursitis of both hips    Tuberculosis    patient  denies this dx as of 09/09/22     Family History  Problem Relation Age of Onset   Hypertension Mother    Stroke Mother    Hypertension Brother    Heart disease Maternal Uncle    Diabetes Maternal Grandmother    Cancer Maternal Grandmother        THROAT CA   Cancer Maternal Grandfather        THROAT CA    Social History   Social History Narrative   Not on file      Review of Systems  Constitutional:  Negative for fever and malaise/fatigue.  HENT:  Negative for congestion.   Eyes:  Negative for blurred vision.   Respiratory:  Negative for cough and shortness of breath.   Cardiovascular:  Negative for chest pain, palpitations and leg swelling.  Gastrointestinal:  Negative for vomiting.  Musculoskeletal:  Negative for back pain.  Skin:  Negative for rash.  Neurological:  Negative for loss of consciousness and headaches.        Objective:   Vitals: BP 100/80   Pulse 93   Ht 5\' 3"  (1.6 m)   Wt 200 lb (90.7 kg)   LMP 05/25/2014   SpO2 96%   BMI 35.43 kg/m    Physical Exam Vitals and nursing note reviewed.  Constitutional:      General: She is not in acute distress.    Appearance: Normal appearance. She is not toxic-appearing.  HENT:     Head: Normocephalic and atraumatic.  Cardiovascular:     Rate and Rhythm: Normal rate and regular rhythm. No extrasystoles are present.    Pulses: Normal pulses.     Heart sounds: Normal heart sounds. No murmur heard.    No friction rub. No gallop.  Pulmonary:     Effort: Pulmonary effort is normal. No respiratory distress.     Breath sounds: Normal breath sounds. No wheezing or rales.  Musculoskeletal:     Right lower leg: No edema.     Left lower leg: No edema.  Skin:    General: Skin is warm and dry.  Neurological:     Mental Status: She is alert and oriented to person, place, and time. Mental status is at baseline.  Psychiatric:        Mood and Affect: Mood normal.        Behavior: Behavior normal.        Thought Content: Thought content normal.        Judgment: Judgment normal.       Results:   Studies obtained and personally reviewed by me:  Pap smear last completed 05/17/17. Negative for intraepithelial lesion or malignancy.    Mammogram last completed 05/13/21. No mammographic evidence of malignancy. Recommended repeat in 2025.   DEXA scan last completed 07/07/21. T-score at -1.3 at right femoral neck. She has osteopenia. Recommended repeat in 2025.  Labs:       Component Value Date/Time   NA 138 09/25/2022 0803   K 3.9  09/25/2022 0803   CL 106 09/25/2022 0803   CO2 19 (L) 09/25/2022 0803   GLUCOSE 103 (H) 09/25/2022 0803   BUN 11 09/25/2022 0803   CREATININE 0.96 09/25/2022 0803   CREATININE 0.98 05/15/2022 1043   CALCIUM 8.6 (L) 09/25/2022 0803   PROT 6.6 05/15/2022 1043   ALBUMIN 3.7 01/27/2019 0855   AST 26 05/15/2022 1043   ALT 26 05/15/2022 1043   ALKPHOS 118 01/27/2019 0855   BILITOT 1.0 05/15/2022 1043  GFRNONAA >60 09/25/2022 0803   GFRNONAA 82 05/10/2020 1005   GFRAA 95 05/10/2020 1005     Lab Results  Component Value Date   WBC 3.5 (L) 09/25/2022   HGB 13.0 09/25/2022   HCT 41.4 09/25/2022   MCV 92.0 09/25/2022   PLT 163 09/25/2022    Lab Results  Component Value Date   CHOL 155 05/15/2022   HDL 87 05/15/2022   LDLCALC 50 05/15/2022   TRIG 96 05/15/2022   CHOLHDL 1.8 05/15/2022    Lab Results  Component Value Date   HGBA1C 5.2 05/15/2022     Lab Results  Component Value Date   TSH 0.50 05/15/2022      Assessment & Plan:   Glucose intolerance: BMI at 35.43. Takes Ozempic 2 mg once weekly.    Rheumatoid arthritis: currently treated with Arava 20 mg daily, Hyrimoz 0.4 cc subcutaneous every other week for thirty days. She gets steroid injections in both knees every 3 months.  Chronic maxillary sinusitis: status post functional endoscopic sinus surgery with right maxillary antrostomy and anterior ethmoidectomy and bilateral inferior turbinate reduction performed on 09/25/2022 with Dr. Marene Lenz. She is using a daily antibiotic sinus rinse.   Depression: stable with Wellbutrin XL 300 mg daily, Celexa 20 mg daily in the morning.    Hypothyroidism: treated with Synthroid 75 mcg daily.   Hyperlipidemia: treated with Crestor 5 mg three times weekly.   Hypertension: treated with Toprol-XL 25 mg daily. Blood pressure normal today at 100/80. Reports blood pressure has been normal at home. Seen by cardiologist, Dr. Wyline Mood on 11/11/22 and EKG showed sinus tachycardia with  anterior and inferior q-waves. History of chronic dyspnea on exertion and fatigue. Denies leg swelling.    Chronic back pain: treated with Celebrex 200 mg twice daily, Flexeril 10 mg daily at bedtime.   Labs collected today. Awaiting results.   Pap smear last completed 05/17/17. Negative for intraepithelial lesion or malignancy. Pelvic deferred to gynecologist.   Mammogram last completed 05/13/21. No mammographic evidence of malignancy. Recommended repeat in 2025.   Ordered Cologuard.  DEXA scan last completed 07/07/21. T-score at -1.3 at right femoral neck. She has osteopenia. Recommended repeat in 2025.  Vaccine counseling: administered flu, tetanus updates.  Return in 6 months for health maintenance exam or as needed.    I,Alexander Ruley,acting as a Neurosurgeon for Margaree Mackintosh, MD.,have documented all relevant documentation on the behalf of Margaree Mackintosh, MD,as directed by  Margaree Mackintosh, MD while in the presence of Margaree Mackintosh, MD.   I, Margaree Mackintosh, MD, have reviewed all documentation for this visit. The documentation on 11/30/22 for the exam, diagnosis, procedures, and orders are all accurate and complete.

## 2022-11-22 ENCOUNTER — Other Ambulatory Visit: Payer: Self-pay | Admitting: Internal Medicine

## 2022-11-24 ENCOUNTER — Ambulatory Visit: Payer: No Typology Code available for payment source | Admitting: Internal Medicine

## 2022-11-24 ENCOUNTER — Other Ambulatory Visit: Payer: No Typology Code available for payment source

## 2022-11-24 ENCOUNTER — Encounter: Payer: Self-pay | Admitting: Internal Medicine

## 2022-11-24 VITALS — BP 100/80 | HR 93 | Ht 63.0 in | Wt 200.0 lb

## 2022-11-24 DIAGNOSIS — E1165 Type 2 diabetes mellitus with hyperglycemia: Secondary | ICD-10-CM

## 2022-11-24 DIAGNOSIS — M059 Rheumatoid arthritis with rheumatoid factor, unspecified: Secondary | ICD-10-CM

## 2022-11-24 DIAGNOSIS — E1169 Type 2 diabetes mellitus with other specified complication: Secondary | ICD-10-CM

## 2022-11-24 DIAGNOSIS — Z6835 Body mass index (BMI) 35.0-35.9, adult: Secondary | ICD-10-CM

## 2022-11-24 DIAGNOSIS — Z9884 Bariatric surgery status: Secondary | ICD-10-CM | POA: Diagnosis not present

## 2022-11-24 DIAGNOSIS — Z1211 Encounter for screening for malignant neoplasm of colon: Secondary | ICD-10-CM

## 2022-11-24 DIAGNOSIS — E039 Hypothyroidism, unspecified: Secondary | ICD-10-CM | POA: Diagnosis not present

## 2022-11-24 DIAGNOSIS — Z23 Encounter for immunization: Secondary | ICD-10-CM | POA: Diagnosis not present

## 2022-11-24 DIAGNOSIS — F32A Depression, unspecified: Secondary | ICD-10-CM

## 2022-11-24 DIAGNOSIS — E785 Hyperlipidemia, unspecified: Secondary | ICD-10-CM

## 2022-11-24 DIAGNOSIS — F419 Anxiety disorder, unspecified: Secondary | ICD-10-CM

## 2022-11-24 DIAGNOSIS — E7800 Pure hypercholesterolemia, unspecified: Secondary | ICD-10-CM | POA: Diagnosis not present

## 2022-11-24 NOTE — Patient Instructions (Addendum)
Cologard ordered. Lipid and liver panels drawn to follow up on hyperlipidemia. Hgb AIC ordered.  Continue diet and exercise efforts.  Continue current medications.  Follow-up in 6 months for health maintenance exam and fasting labs.

## 2022-11-25 LAB — LIPID PANEL
Cholesterol: 166 mg/dL (ref <200)
HDL: 88 mg/dL (ref >=50)
LDL Cholesterol (Calc): 61 mg/dL
Non-HDL Cholesterol (Calc): 78 mg/dL (ref <130)
Total CHOL/HDL Ratio: 1.9 (calc) (ref <5.0)
Triglycerides: 83 mg/dL (ref <150)

## 2022-11-25 LAB — HEPATIC FUNCTION PANEL
AG Ratio: 1.5 (calc) (ref 1.0–2.5)
ALT: 39 U/L — ABNORMAL HIGH (ref 6–29)
AST: 37 U/L — ABNORMAL HIGH (ref 10–35)
Albumin: 4.1 g/dL (ref 3.6–5.1)
Alkaline phosphatase (APISO): 121 U/L (ref 37–153)
Bilirubin, Direct: 0.2 mg/dL (ref 0.0–0.2)
Globulin: 2.7 g/dL (ref 1.9–3.7)
Indirect Bilirubin: 0.7 mg/dL (ref 0.2–1.2)
Total Bilirubin: 0.9 mg/dL (ref 0.2–1.2)
Total Protein: 6.8 g/dL (ref 6.1–8.1)

## 2022-11-25 LAB — HEMOGLOBIN A1C
Hgb A1c MFr Bld: 5 %{Hb} (ref <5.7)
Mean Plasma Glucose: 97 mg/dL
eAG (mmol/L): 5.4 mmol/L

## 2022-11-29 ENCOUNTER — Other Ambulatory Visit: Payer: Self-pay | Admitting: Internal Medicine

## 2022-12-05 ENCOUNTER — Other Ambulatory Visit (HOSPITAL_COMMUNITY): Payer: Self-pay

## 2022-12-05 MED ORDER — HYDROCODONE-ACETAMINOPHEN 5-325 MG PO TABS
1.0000 | ORAL_TABLET | Freq: Every day | ORAL | 0 refills | Status: DC | PRN
  Filled 2022-12-05: qty 25, 25d supply, fill #0

## 2022-12-07 ENCOUNTER — Other Ambulatory Visit (HOSPITAL_COMMUNITY): Payer: Self-pay

## 2022-12-07 LAB — COLOGUARD: COLOGUARD: POSITIVE — AB

## 2022-12-08 NOTE — Progress Notes (Signed)
Referred to [employer]

## 2022-12-09 ENCOUNTER — Encounter: Payer: Self-pay | Admitting: Internal Medicine

## 2022-12-09 ENCOUNTER — Other Ambulatory Visit: Payer: Self-pay

## 2022-12-09 DIAGNOSIS — R195 Other fecal abnormalities: Secondary | ICD-10-CM

## 2023-01-05 ENCOUNTER — Encounter: Payer: Self-pay | Admitting: Internal Medicine

## 2023-01-05 ENCOUNTER — Ambulatory Visit (AMBULATORY_SURGERY_CENTER): Payer: No Typology Code available for payment source

## 2023-01-05 ENCOUNTER — Other Ambulatory Visit (HOSPITAL_COMMUNITY): Payer: Self-pay

## 2023-01-05 VITALS — Ht 63.0 in | Wt 195.0 lb

## 2023-01-05 DIAGNOSIS — R195 Other fecal abnormalities: Secondary | ICD-10-CM

## 2023-01-05 MED ORDER — HYDROCODONE-ACETAMINOPHEN 5-325 MG PO TABS
1.0000 | ORAL_TABLET | Freq: Every day | ORAL | 0 refills | Status: DC
  Filled 2023-01-05: qty 25, 30d supply, fill #0

## 2023-01-05 MED ORDER — NA SULFATE-K SULFATE-MG SULF 17.5-3.13-1.6 GM/177ML PO SOLN
1.0000 | Freq: Once | ORAL | 0 refills | Status: AC
Start: 2023-01-05 — End: 2023-01-05

## 2023-01-05 NOTE — Progress Notes (Signed)
No egg or soy allergy known to patient  No issues known to pt with past sedation with any surgeries or procedures Patient denies ever being told they had issues or difficulty with intubation  No FH of Malignant Hyperthermia Pt is not on diet pills Ozempic weekly injection patient advised to stop 7 days before procedure to avoid cancellation  Pt is not on  home 02  Pt is not on blood thinners  Pt reports drug induced constipation with ozempic  No A fib or A flutter Have any cardiac testing pending--no  LOA: independent  Prep: suprep  Patient's chart reviewed by Mary Herman CNRA prior to previsit and patient appropriate for the [facility].  Previsit completed and red dot placed by patient's name on their procedure day (on provider's schedule).     PV competed with patient. Prep instructions sent via mychart and home address. [facility]rx coupon for [facility] provided to use for price reduction if needed.

## 2023-01-10 ENCOUNTER — Other Ambulatory Visit: Payer: Self-pay | Admitting: Internal Medicine

## 2023-01-10 DIAGNOSIS — E559 Vitamin D deficiency, unspecified: Secondary | ICD-10-CM

## 2023-01-11 ENCOUNTER — Encounter: Payer: Self-pay | Admitting: Internal Medicine

## 2023-01-11 ENCOUNTER — Ambulatory Visit
Admission: RE | Admit: 2023-01-11 | Discharge: 2023-01-11 | Disposition: A | Discharge status: Home / Self Care | Payer: No Typology Code available for payment source | Source: Ambulatory Visit | Attending: Internal Medicine | Admitting: Internal Medicine

## 2023-01-11 ENCOUNTER — Other Ambulatory Visit: Payer: Self-pay | Admitting: Internal Medicine

## 2023-01-11 ENCOUNTER — Ambulatory Visit: Payer: No Typology Code available for payment source | Admitting: Internal Medicine

## 2023-01-11 VITALS — BP 100/80 | HR 88 | Temp 98.6°F | Ht 63.0 in | Wt 190.0 lb

## 2023-01-11 DIAGNOSIS — M059 Rheumatoid arthritis with rheumatoid factor, unspecified: Secondary | ICD-10-CM | POA: Diagnosis not present

## 2023-01-11 DIAGNOSIS — J22 Unspecified acute lower respiratory infection: Secondary | ICD-10-CM | POA: Diagnosis not present

## 2023-01-11 DIAGNOSIS — Z8616 Personal history of COVID-19: Secondary | ICD-10-CM | POA: Diagnosis not present

## 2023-01-11 DIAGNOSIS — E1169 Type 2 diabetes mellitus with other specified complication: Secondary | ICD-10-CM | POA: Diagnosis not present

## 2023-01-11 DIAGNOSIS — E785 Hyperlipidemia, unspecified: Secondary | ICD-10-CM

## 2023-01-11 MED ORDER — HYDROCODONE BIT-HOMATROP MBR 5-1.5 MG/5ML PO SOLN: 5.0000 mL | Freq: Three times a day (TID) | ORAL | 0 refills | Status: DC | PRN

## 2023-01-11 MED ORDER — LEVOFLOXACIN 500 MG PO TABS: 500.0000 mg | ORAL_TABLET | Freq: Every day | ORAL | 0 refills | Status: AC

## 2023-01-11 MED ORDER — METHYLPREDNISOLONE 4 MG PO TABS: ORAL_TABLET | ORAL | 0 refills | Status: DC

## 2023-01-11 MED ORDER — CEFTRIAXONE SODIUM 1 G IJ SOLR
1.0000 g | Freq: Once | INTRAMUSCULAR | Status: AC
Start: 2023-01-11 — End: 2023-01-11
  Administered 2023-01-11: 1 g via INTRAMUSCULAR

## 2023-01-11 NOTE — Progress Notes (Signed)
Patient Care Team: Margaree Mackintosh, MD as PCP - General (Internal Medicine) Maisie Fus, MD as PCP - Cardiology (Cardiology) Himmelrich, Loree Fee, RD (Inactive) as Dietitian  Visit Date: 01/11/23  Subjective:    Patient ID: Mary Herman , Female   DOB: 1960/04/29, 62 y.o.    MRN: [id number]   62 y.o. Female presents today for worsening shortness of breath, chest congestion, cough with green sputum since recent Covid-19 infection. Denies fever. Had nausea that resolved after one day. Seen at [facility] in [place] on 01/01/23. Was treated with Doxycycline, oral steroids and cough medication. Pt says she tested positive for Covid -19 on  Oct 25.  A CXR performed on 01/01/23 at Urgent Care  was clear. Completed course of antibiotic on 01/10/23 and has also completed five day course of prednisone. She is sleeping well. History of rheumatoid arthritis treated with Hyrimoz. Followed by Dr. Maretta Los for chronic maxillary sinusitis. She had functional endoscopic sinus surgery with right maxillary antrostomy and anterior ethmoidectomy and bilateral inferior turbinate reduction performed on 09/25/2022.   Past Medical History:  Diagnosis Date   Anemia    Chronic back pain    Depression    Diabetes mellitus without complication (HCC)    takes on sundays - semaglutide   Dyspnea    with activity   Environmental allergies    Family history of adverse reaction to anesthesia    daughter- n/v       GERD (gastroesophageal reflux disease)    Hematuria    History of cystitis    INTERSTITIAL CYSTITIS--  NO ISSUES FOR YRS   History of gastroesophageal reflux (GERD)    DENIES ISSUES SINCE GASTRIC BY PASS   History of hypertension    NO ISSUE SINCE WT LOSS AFTER GASTRIC BYPASS   History of kidney stones    surgery to remove   History of obstructive sleep apnea    USED CPAP--  NO ISSUE SINCE WT LOSS AFTER GASTRIC BYPASS   Hypertension    Hypothyroidism    Pneumonia    x 1    Pre-diabetes 06/2021   Rheumatoid arthritis (HCC)    Right ureteral stone    Sleep apnea    does not use cpap   Trochanteric bursitis of both hips    Tuberculosis    patient denies this dx as of 09/09/22     Family History  Problem Relation Age of Onset   Colon polyps Mother    Hypertension Mother    Stroke Mother    Hypertension Brother    Heart disease Maternal Uncle    Diabetes Maternal Grandmother    Cancer Maternal Grandmother        THROAT CA   Cancer Maternal Grandfather        THROAT CA   Esophageal cancer Paternal Grandmother    Colon cancer Neg Hx    Rectal cancer Neg Hx    Stomach cancer Neg Hx     Social Hx: works from home for [employer].     Review of Systems  Constitutional:  Negative for fever and malaise/fatigue.  HENT:  Positive for congestion (Chest). Negative for sore throat.   Eyes:  Negative for blurred vision.  Respiratory:  Positive for cough, sputum production (Green) and shortness of breath.   Cardiovascular:  Negative for chest pain, palpitations and leg swelling.  Gastrointestinal:  Negative for vomiting.  Musculoskeletal:  Negative for back pain.  Skin:  Negative for rash.  Neurological:  Negative for loss of consciousness and headaches.        Objective:   Vitals: BP 100/80   Pulse 88   Temp 98.6 F (37 C)   Ht 5\' 3"  (1.6 m)   Wt 190 lb (86.2 kg)   LMP 05/25/2014   SpO2 96%   BMI 33.66 kg/m    Physical Exam Vitals and nursing note reviewed.  Constitutional:      General: She is not in acute distress.    Appearance: Normal appearance. She is not toxic-appearing.  HENT:     Head: Normocephalic and atraumatic.     Right Ear: Hearing, tympanic membrane, ear canal and external ear normal.     Left Ear: Hearing, tympanic membrane, ear canal and external ear normal.     Mouth/Throat:     Comments: Pharynx slightly injected without exudate. Pulmonary:     Effort: Pulmonary effort is normal. No respiratory distress.      Breath sounds: Normal breath sounds. No wheezing or rales.  Skin:    General: Skin is warm and dry.  Neurological:     Mental Status: She is alert and oriented to person, place, and time. Mental status is at baseline.  Psychiatric:        Mood and Affect: Mood normal.        Behavior: Behavior normal.        Thought Content: Thought content normal.        Judgment: Judgment normal.       Results:   Studies obtained and personally reviewed by me:  01/01/23 chest X-ray clear.  Labs:       Component Value Date/Time   NA 138 09/25/2022 0803   K 3.9 09/25/2022 0803   CL 106 09/25/2022 0803   CO2 19 (L) 09/25/2022 0803   GLUCOSE 103 (H) 09/25/2022 0803   BUN 11 09/25/2022 0803   CREATININE 0.96 09/25/2022 0803   CREATININE 0.98 05/15/2022 1043   CALCIUM 8.6 (L) 09/25/2022 0803   PROT 6.8 11/24/2022 0947   ALBUMIN 3.7 01/27/2019 0855   AST 37 (H) 11/24/2022 0947   ALT 39 (H) 11/24/2022 0947   ALKPHOS 118 01/27/2019 0855   BILITOT 0.9 11/24/2022 0947   GFRNONAA >60 09/25/2022 0803   GFRNONAA 82 05/10/2020 1005   GFRAA 95 05/10/2020 1005     Lab Results  Component Value Date   WBC 3.5 (L) 09/25/2022   HGB 13.0 09/25/2022   HCT 41.4 09/25/2022   MCV 92.0 09/25/2022   PLT 163 09/25/2022    Lab Results  Component Value Date   CHOL 166 11/24/2022   HDL 88 11/24/2022   LDLCALC 61 11/24/2022   TRIG 83 11/24/2022   CHOLHDL 1.9 11/24/2022    Lab Results  Component Value Date   HGBA1C 5.0 11/24/2022     Lab Results  Component Value Date   TSH 0.50 05/15/2022      Assessment & Plan:   Persistent lower respiratory infection: administered Rocephin 1 g, Depo-Medrol 80 mg IM. given in office also. Prescribed Hycodan syrup 1 tsp every eight hours as needed for cough, levofloxacin 500 mg daily for 10 days. CXR today showed no infiltrate or CHF. Get plenty of rest and stay well-hydrated.Call if not better in 7-10 days or sooner if worse.    I,Alexander  Ruley,acting as a Neurosurgeon for Margaree Mackintosh, MD.,have documented all relevant documentation on the behalf of Margaree Mackintosh, MD,as directed  by  Margaree Mackintosh, MD while in the presence of Margaree Mackintosh, MD.   I, Margaree Mackintosh, MD, have reviewed all documentation for this visit. The documentation on 01/22/23 for the exam, diagnosis, procedures, and orders are all accurate and complete.

## 2023-01-11 NOTE — Telephone Encounter (Signed)
Copied from CRM [id number]. Topic: Clinical - Prescription Issue >> Jan 11, 2023  2:30 PM Deaijah H wrote: Reason for CRM: [facility] to give a call pt std there's an issue with the prescription  [facility] to get [facility] information and pharmacist states that CELEXA and Levaquin should be taken at least 12 hours in between, if taking together it could cause QT prolongation.

## 2023-01-13 ENCOUNTER — Telehealth: Payer: Self-pay | Admitting: Internal Medicine

## 2023-01-13 ENCOUNTER — Telehealth: Payer: Self-pay

## 2023-01-13 DIAGNOSIS — Z0289 Encounter for other administrative examinations: Secondary | ICD-10-CM

## 2023-01-13 NOTE — Telephone Encounter (Signed)
Letter typed and sent to patient via mychart.

## 2023-01-13 NOTE — Telephone Encounter (Signed)
Patient called and stated that she is returning a phone to [place]. She is also requesting a call back. Please advise.

## 2023-01-13 NOTE — Telephone Encounter (Signed)
Copied from CRM [id number]. Topic: General - Other >> Jan 13, 2023 11:03 AM Roswell Nickel wrote: Reason for CRM: patient Calling requesting a doctor's note for her employer, she  would likes the note to send via mychart.patient's contact info  [phone]   Patient would like a work excuse letter from 11/18 through today. She said she hasn't felt better and hasn't been going to work.   Okay to type letter?

## 2023-01-13 NOTE — Telephone Encounter (Signed)
Left message for pt to call back  °

## 2023-01-13 NOTE — Telephone Encounter (Signed)
PT is calling to report that she is currently on prednisone and an antibiotic and wants to know will this affect her procedure on 11/25. Please advise.

## 2023-01-13 NOTE — Telephone Encounter (Signed)
Pt stated that she currently has an Upper respiratory infection and being treated with prednisone and antibiotics.  Pt colonoscopy has been rescheduled to 02/10/2023 at 10:00 AM. Pt made aware. New prep instructions were sent to pt via my chart. Pt made aware. Pt verbalized understanding with all questions answered.

## 2023-01-14 MED ORDER — ALBUTEROL SULFATE HFA 108 (90 BASE) MCG/ACT IN AERS: 2.0000 | INHALATION_SPRAY | RESPIRATORY_TRACT | 99 refills | Status: DC | PRN

## 2023-01-14 NOTE — Telephone Encounter (Signed)
Inhaler was sent

## 2023-01-14 NOTE — Addendum Note (Signed)
Addended by: Gregery Na on: 01/14/2023 03:33 PM   Modules accepted: Orders

## 2023-01-14 NOTE — Telephone Encounter (Signed)
Copied from CRM [id number]. Topic: Clinical - Medication Question >> Jan 14, 2023 12:06 PM Larwance Sachs wrote: Reason for CRM: Patient stated she was seen earlier this week and just wants to confirm if Dr. Corrie Dandy is sending in prescription for inhaler for her.   Call back at [phone]   Which inhaler would you like to send in?

## 2023-01-18 ENCOUNTER — Encounter: Payer: No Typology Code available for payment source | Admitting: Internal Medicine

## 2023-01-18 ENCOUNTER — Telehealth: Payer: Self-pay | Admitting: Orthopaedic Surgery

## 2023-01-22 NOTE — Patient Instructions (Signed)
Chest x-ray today shows no infiltrate or heart failure.  Given 1 g IM Rocephin in the office as well as Depo-Medrol 80 mg IM.  Have prescribed Hycodan syrup 1 teaspoon every 8 hours as needed for cough.  Take Levaquin 500 milligrams daily for 10 days.  Call if not better in 7 to 10 days.  Get plenty of rest and stay well-hydrated.

## 2023-02-06 ENCOUNTER — Other Ambulatory Visit (HOSPITAL_COMMUNITY): Payer: Self-pay

## 2023-02-06 MED ORDER — HYDROCODONE-ACETAMINOPHEN 5-325 MG PO TABS
0.5000 | ORAL_TABLET | Freq: Two times a day (BID) | ORAL | 0 refills | Status: DC | PRN
  Filled 2023-02-06: qty 50, 25d supply, fill #0

## 2023-02-09 NOTE — Progress Notes (Unsigned)
[facility] History and Physical   Primary Care Physician:  Margaree Mackintosh, MD   Reason for Procedure:   Abnormal cologuard  Plan:    colonoscopy     HPI: Mary Herman is a 62 y.o. female here to evaluate abnormal cologuard w/ colonoscopy   Past Medical History:  Diagnosis Date   Anemia    Chronic back pain    Depression    Diabetes mellitus without complication (HCC)    takes on sundays - semaglutide   Dyspnea    with activity   Environmental allergies    Family history of adverse reaction to anesthesia    daughter- n/v       GERD (gastroesophageal reflux disease)    Hematuria    History of cystitis    INTERSTITIAL CYSTITIS--  NO ISSUES FOR YRS   History of gastroesophageal reflux (GERD)    DENIES ISSUES SINCE GASTRIC BY PASS   History of hypertension    NO ISSUE SINCE WT LOSS AFTER GASTRIC BYPASS   History of kidney stones    surgery to remove   History of obstructive sleep apnea    USED CPAP--  NO ISSUE SINCE WT LOSS AFTER GASTRIC BYPASS   Hypertension    Hypothyroidism    Pneumonia    x 1   Pre-diabetes 06/2021   Rheumatoid arthritis (HCC)    Right ureteral stone    Sleep apnea    does not use cpap   Trochanteric bursitis of both hips    Tuberculosis    patient denies this dx as of 09/09/22    Past Surgical History:  Procedure Laterality Date   CARDIAC CATHETERIZATION  12-07-2006  [facility]   NORMAL CORONARIES ARTERIES/ NORMAL LVF/ MILD INCREASED END-DIASTOLIC PRESSURE   CESAREAN SECTION  1998   CHOLECYSTECTOMY  1980'S   CYSTOSCOPY WITH RETROGRADE PYELOGRAM, URETEROSCOPY AND STENT PLACEMENT Bilateral 12/26/2012   Procedure: cystoscopy with bilateral retrogrades, bilateral ureteroscopy ;  Surgeon: Valetta Fuller, MD;  Location: [facility];  Service: Urology;  Laterality: Bilateral;   CYSTOSCOPY WITH STENT PLACEMENT Bilateral 12/26/2012   Procedure: CYSTOSCOPY WITH STENT PLACEMENT;  Surgeon: Valetta Fuller, MD;   Location: [facility];  Service: Urology;  Laterality: Bilateral;   CYSTOSCOPY/ HYDRODISTENTION  YRS AGO   DILATATION & CURETTAGE/HYSTEROSCOPY WITH MYOSURE N/A 11/25/2018   Procedure: DILATATION & CURETTAGE/HYSTEROSCOPY WITH MYOSURE;  Surgeon: Dara Lords, MD;  Location: [facility];  Service: Gynecology;  Laterality: N/A;   ETHMOIDECTOMY Right 09/25/2022   Procedure: RIGHT ANTERIOR ETHMOIDECTOMY;  Surgeon: Laren Boom, DO;  Location: [facility];  Service: ENT;  Laterality: Right;   HERNIA REPAIR     HOLMIUM LASER APPLICATION Right 12/26/2012   Procedure: HOLMIUM LASER APPLICATION;  Surgeon: Valetta Fuller, MD;  Location: [facility];  Service: Urology;  Laterality: Right;   KNEE ARTHROSCOPY Left 1992   MAXILLARY ANTROSTOMY Right 09/25/2022   Procedure: RIGHT MAXILLARY ANTROSTOMY;  Surgeon: Laren Boom, DO;  Location: [facility];  Service: ENT;  Laterality: Right;   NASAL TURBINATE REDUCTION Bilateral 09/25/2022   Procedure: BILATERAL INFERIOR TURBINATE REDUCTION/SUBMUCOSAL RESECTION;  Surgeon: Laren Boom, DO;  Location: [facility];  Service: ENT;  Laterality: Bilateral;   ROBOTIC ASSISTED TOTAL HYSTERECTOMY WITH BILATERAL SALPINGO OOPHERECTOMY N/A 01/31/2019   Procedure: XI ROBOTIC ASSISTED TOTAL HYSTERECTOMY WITH BILATERAL SALPINGO OOPHORECTOMY;  Surgeon: Adolphus Birchwood, MD;  Location: [facility];  Service: Gynecology;  Laterality: N/A;   ROUX-EN-Y PROCEDURE  07/25/2007   SHOULDER ARTHROSCOPY Right 2011   SINUS ENDO WITH FUSION Right 09/25/2022   Procedure: FUNCTIONAL ENDOSCOPIC SINUS WITH FUSION;  Surgeon: Laren Boom, DO;  Location: [facility];  Service: ENT;  Laterality: Right;   TMJ ARTHROPLASTY  1990'S   BILATERAL UPPER   TOTAL ABDOMINAL HYSTERECTOMY  01/31/2019    Prior to Admission medications   Medication Sig Start Date End Date Taking? Authorizing Provider  ACCU-CHEK GUIDE test strip USE TO TEST ONCE DAILY 07/23/22   Worthy Rancher B, FNP  Accu-Chek  Softclix Lancets lancets USE ONCE DAILY 11/23/22   Margaree Mackintosh, MD  acetic acid-hydrocortisone (VOSOL-HC) OTIC solution Place 3 drops into the left ear 3 (three) times daily. 12/18/22   [provider]  Adalimumab-adaz (HYRIMOZ) 40 MG/0.4ML SOAJ Inject 0.56ml Subcutaneous Every other week for 30 days    [provider]  albuterol (VENTOLIN HFA) 108 (90 Base) MCG/ACT inhaler Inhale 2 puffs into the lungs every 4 (four) hours as needed for wheezing or shortness of breath. 01/14/23   Margaree Mackintosh, MD  buPROPion (WELLBUTRIN XL) 300 MG 24 hr tablet TAKE 1 TABLET BY MOUTH EVERY DAY 05/19/22   Margaree Mackintosh, MD  celecoxib (CELEBREX) 200 MG capsule TAKE 1 CAPSULE BY MOUTH TWICE A DAY 11/16/22   Margaree Mackintosh, MD  citalopram (CELEXA) 20 MG tablet TAKE 1 TABLET BY MOUTH EVERY DAY IN THE MORNING 11/30/22   Baxley, Luanna Cole, MD  clotrimazole-betamethasone (LOTRISONE) cream APPLY TO AFFECTED AREA TWICE A DAY Patient taking differently: Apply 1 Application topically daily as needed (yeast). 09/13/20   Margaree Mackintosh, MD  cyclobenzaprine (FLEXERIL) 10 MG tablet TAKE 1 TABLET BY MOUTH EVERYDAY AT BEDTIME 10/12/22   Margaree Mackintosh, MD  diazepam (VALIUM) 5 MG tablet TAKE 1 TABLET BY MOUTH EVERY DAY AT BEDTIME AS NEEDED 10/12/22   Margaree Mackintosh, MD  folic acid (FOLVITE) 1 MG tablet Take 1 mg by mouth daily. 08/12/20   [provider]  HYDROcodone bit-homatropine (HYCODAN) 5-1.5 MG/5ML syrup Take 5 mLs by mouth every 8 (eight) hours as needed for cough. 01/11/23   Margaree Mackintosh, MD  HYDROcodone-acetaminophen (NORCO/VICODIN) 5-325 MG tablet Take 1 tablet by mouth every 6 (six) hours as needed for moderate pain. 08/06/21   Kathryne Hitch, MD  HYDROcodone-acetaminophen (NORCO/VICODIN) 5-325 MG tablet Take 0.5-1 tablet by mouth daily as needed 12/05/22     HYDROcodone-acetaminophen (NORCO/VICODIN) 5-325 MG tablet Take 1/2  to 1 tablet by mouth daily only when needed for pain refractory to  other interventions. (Using only 25 tablets monthly.) 01/05/23     HYDROcodone-acetaminophen (NORCO/VICODIN) 5-325 MG tablet Take 0.5-1 tablets by mouth up to 2 (two) times daily as needed for pain refractory to other interventions. 02/06/23     leflunomide (ARAVA) 20 MG tablet Take 20 mg by mouth daily. 10/21/20   [provider]  levocetirizine (XYZAL) 5 MG tablet Take 5 mg by mouth daily as needed for allergies.    [provider]  levothyroxine (SYNTHROID) 75 MCG tablet TAKE 1 TABLET BY MOUTH EVERY DAY 11/30/22   Margaree Mackintosh, MD  Melatonin 10 MG CAPS Take 10 mg by mouth at bedtime.    [provider]  methylPREDNISolone (MEDROL) 4 MG tablet Take in tapering course as directed 6--5-4-3-2-1 01/11/23   Margaree Mackintosh, MD  metoprolol succinate (TOPROL-XL) 25 MG 24 hr tablet TAKE 1 TABLET BY MOUTH EVERY DAY 07/06/22   Margaree Mackintosh,  MD  montelukast (SINGULAIR) 10 MG tablet TAKE 1 TABLET BY MOUTH EVERYDAY AT BEDTIME 05/19/22   Baxley, Luanna Cole, MD  mupirocin ointment (BACTROBAN) 2 % SMARTSIG:1 Milliliter(s) Topical Daily    [provider]  OVER THE COUNTER MEDICATION Take 1 tablet by mouth at bedtime. Bariatric Iron    [provider]  rosuvastatin (CRESTOR) 5 MG tablet TAKE 1 TABLET BY MOUTH THREE TIMES A WEEK. 10/05/22   Margaree Mackintosh, MD  Semaglutide, 2 MG/DOSE, (OZEMPIC, 2 MG/DOSE,) 8 MG/3ML SOPN Inject 2 mg into the skin every 7 (seven) days. 11/11/22   Maisie Fus, MD  Vitamin D, Ergocalciferol, (DRISDOL) 1.25 MG (50000 UNIT) CAPS capsule TAKE 1 CAPSULE BY MOUTH ONE TIME PER WEEK 01/11/23   Margaree Mackintosh, MD    Current Outpatient Medications  Medication Sig Dispense Refill   ACCU-CHEK GUIDE test strip USE TO TEST ONCE DAILY 50 strip PRN   Accu-Chek Softclix Lancets lancets USE ONCE DAILY 100 each PRN   acetic acid-hydrocortisone (VOSOL-HC) OTIC solution Place 3 drops into the left ear 3 (three) times daily.     Adalimumab-adaz (HYRIMOZ) 40  MG/0.4ML SOAJ Inject 0.45ml Subcutaneous Every other week for 30 days     albuterol (VENTOLIN HFA) 108 (90 Base) MCG/ACT inhaler Inhale 2 puffs into the lungs every 4 (four) hours as needed for wheezing or shortness of breath. 8 g PRN   buPROPion (WELLBUTRIN XL) 300 MG 24 hr tablet TAKE 1 TABLET BY MOUTH EVERY DAY 90 tablet 3   celecoxib (CELEBREX) 200 MG capsule TAKE 1 CAPSULE BY MOUTH TWICE A DAY 180 capsule 1   citalopram (CELEXA) 20 MG tablet TAKE 1 TABLET BY MOUTH EVERY DAY IN THE MORNING 90 tablet 1   clotrimazole-betamethasone (LOTRISONE) cream APPLY TO AFFECTED AREA TWICE A DAY (Patient taking differently: Apply 1 Application topically daily as needed (yeast).) 30 g prn   cyclobenzaprine (FLEXERIL) 10 MG tablet TAKE 1 TABLET BY MOUTH EVERYDAY AT BEDTIME 90 tablet 1   diazepam (VALIUM) 5 MG tablet TAKE 1 TABLET BY MOUTH EVERY DAY AT BEDTIME AS NEEDED 90 tablet 1   folic acid (FOLVITE) 1 MG tablet Take 1 mg by mouth daily.     HYDROcodone bit-homatropine (HYCODAN) 5-1.5 MG/5ML syrup Take 5 mLs by mouth every 8 (eight) hours as needed for cough. 120 mL 0   HYDROcodone-acetaminophen (NORCO/VICODIN) 5-325 MG tablet Take 1 tablet by mouth every 6 (six) hours as needed for moderate pain. 30 tablet 0   HYDROcodone-acetaminophen (NORCO/VICODIN) 5-325 MG tablet Take 0.5-1 tablet by mouth daily as needed 25 tablet 0   HYDROcodone-acetaminophen (NORCO/VICODIN) 5-325 MG tablet Take 1/2  to 1 tablet by mouth daily only when needed for pain refractory to other interventions. (Using only 25 tablets monthly.) 25 tablet 0   HYDROcodone-acetaminophen (NORCO/VICODIN) 5-325 MG tablet Take 0.5-1 tablets by mouth up to 2 (two) times daily as needed for pain refractory to other interventions. 50 tablet 0   leflunomide (ARAVA) 20 MG tablet Take 20 mg by mouth daily.     levocetirizine (XYZAL) 5 MG tablet Take 5 mg by mouth daily as needed for allergies.     levothyroxine (SYNTHROID) 75 MCG tablet TAKE 1 TABLET BY  MOUTH EVERY DAY 90 tablet 1   Melatonin 10 MG CAPS Take 10 mg by mouth at bedtime.     methylPREDNISolone (MEDROL) 4 MG tablet Take in tapering course as directed 6--5-4-3-2-1 21 tablet 0   metoprolol succinate (TOPROL-XL) 25 MG 24  hr tablet TAKE 1 TABLET BY MOUTH EVERY DAY 90 tablet 3   montelukast (SINGULAIR) 10 MG tablet TAKE 1 TABLET BY MOUTH EVERYDAY AT BEDTIME 90 tablet 3   mupirocin ointment (BACTROBAN) 2 % SMARTSIG:1 Milliliter(s) Topical Daily     OVER THE COUNTER MEDICATION Take 1 tablet by mouth at bedtime. Bariatric Iron     rosuvastatin (CRESTOR) 5 MG tablet TAKE 1 TABLET BY MOUTH THREE TIMES A WEEK. 36 tablet 1   Semaglutide, 2 MG/DOSE, (OZEMPIC, 2 MG/DOSE,) 8 MG/3ML SOPN Inject 2 mg into the skin every 7 (seven) days. 9 mL 2   Vitamin D, Ergocalciferol, (DRISDOL) 1.25 MG (50000 UNIT) CAPS capsule TAKE 1 CAPSULE BY MOUTH ONE TIME PER WEEK 12 capsule 3   No current facility-administered medications for this visit.    Allergies as of 02/10/2023 - Review Complete 01/11/2023  Allergen Reaction Noted   Codeine Rash 10/22/2010   Methotrexate Rash 11/05/2020    Family History  Problem Relation Age of Onset   Colon polyps Mother    Hypertension Mother    Stroke Mother    Hypertension Brother    Heart disease Maternal Uncle    Diabetes Maternal Grandmother    Cancer Maternal Grandmother        THROAT CA   Cancer Maternal Grandfather        THROAT CA   Esophageal cancer Paternal Grandmother    Colon cancer Neg Hx    Rectal cancer Neg Hx    Stomach cancer Neg Hx     Social History   Socioeconomic History   Marital status: Married    Spouse name: Not on file   Number of children: Not on file   Years of education: Not on file   Highest education level: Not on file  Occupational History   Not on file  Tobacco Use   Smoking status: Never    Passive exposure: Past   Smokeless tobacco: Never  Vaping Use   Vaping status: Never Used  Substance and Sexual Activity    Alcohol use: No   Drug use: No   Sexual activity: Not Currently    Partners: Male    Birth control/protection: Surgical    Comment: Hysterectomy; husband vasectomy-1st intercourse 26 yo-Fewer than 5 partners  Other Topics Concern   Not on file  Social History Narrative   Not on file   Social Drivers of Health   Financial Resource Strain: Patient Declined (01/11/2023)   Overall Financial Resource Strain (CARDIA)    Difficulty of Paying Living Expenses: Patient declined  Food Insecurity: Unknown (01/11/2023)   Hunger Vital Sign    Worried About Running Out of Food in the Last Year: Patient declined    Ran Out of Food in the Last Year: Not on file  Transportation Needs: Patient Declined (01/11/2023)   PRAPARE - Administrator, Civil Service (Medical): Patient declined    Lack of Transportation (Non-Medical): Patient declined  Physical Activity: Unknown (01/11/2023)   Exercise Vital Sign    Days of Exercise per Week: Patient declined    Minutes of Exercise per Session: Not on file  Stress: Not on file  Social Connections: Unknown (01/11/2023)   Social Connection and Isolation Panel [NHANES]    Frequency of Communication with Friends and Family: Patient declined    Frequency of Social Gatherings with Friends and Family: Patient declined    Attends Religious Services: Patient declined    Database administrator or Organizations: Patient declined  Attends Banker Meetings: Not on file    Marital Status: Patient declined  Intimate Partner Violence: Not on file    Review of Systems: Positive for *** All other review of systems negative except as mentioned in the HPI.  Physical Exam: Vital signs LMP 05/25/2014   General:   Alert,  Well-developed, well-nourished, pleasant and cooperative in NAD Lungs:  Clear throughout to auscultation.   Heart:  Regular rate and rhythm; no murmurs, clicks, rubs,  or gallops. Abdomen:  Soft, nontender and nondistended.  Normal bowel sounds.   Neuro/Psych:  Alert and cooperative. Normal mood and affect. A and O x 3   @Tatsuo Musial  Sena Slate, MD, [facility] [phone] (pager) 02/09/2023 10:14 PM@

## 2023-02-10 ENCOUNTER — Encounter: Payer: Self-pay | Admitting: Internal Medicine

## 2023-02-10 ENCOUNTER — Ambulatory Visit: Payer: No Typology Code available for payment source | Admitting: Internal Medicine

## 2023-02-10 VITALS — BP 108/76 | HR 84 | Temp 97.2°F | Resp 19 | Ht 63.0 in | Wt 195.0 lb

## 2023-02-10 DIAGNOSIS — K635 Polyp of colon: Secondary | ICD-10-CM

## 2023-02-10 DIAGNOSIS — R195 Other fecal abnormalities: Secondary | ICD-10-CM | POA: Diagnosis not present

## 2023-02-10 DIAGNOSIS — Z1211 Encounter for screening for malignant neoplasm of colon: Secondary | ICD-10-CM

## 2023-02-10 DIAGNOSIS — D125 Benign neoplasm of sigmoid colon: Secondary | ICD-10-CM

## 2023-02-10 DIAGNOSIS — D123 Benign neoplasm of transverse colon: Secondary | ICD-10-CM

## 2023-02-10 MED ORDER — SODIUM CHLORIDE 0.9 % IV SOLN: 500.0000 mL | Freq: Once | INTRAVENOUS | Status: DC

## 2023-02-10 NOTE — Progress Notes (Signed)
Pt's states no medical or surgical changes since previsit or office visit. 

## 2023-02-10 NOTE — Op Note (Signed)
[facility] Patient Name: Mary Herman Procedure Date: 02/10/2023 10:05 AM MRN: [id number] Endoscopist: Iva Boop , MD, [id number] Age: 62 Referring MD:  Date of Birth: 1960-04-10 Gender: Female Account #: [id number] Procedure:                Colonoscopy Indications:              Positive Cologuard test Medicines:                Monitored Anesthesia Care Procedure:                Pre-Anesthesia Assessment:                           - Prior to the procedure, a History and Physical                            was performed, and patient medications and                            allergies were reviewed. The patient's tolerance of                            previous anesthesia was also reviewed. The risks                            and benefits of the procedure and the sedation                            options and risks were discussed with the patient.                            All questions were answered, and informed consent                            was obtained. Prior Anticoagulants: The patient has                            taken no anticoagulant or antiplatelet agents. ASA                            Grade Assessment: II - A patient with mild systemic                            disease. After reviewing the risks and benefits,                            the patient was deemed in satisfactory condition to                            undergo the procedure.                           After obtaining informed consent, the colonoscope  was passed under direct vision. Throughout the                            procedure, the patient's blood pressure, pulse, and                            oxygen saturations were monitored continuously. The                            CF [id number] #[id number] was introduced through the anus                            and advanced to the the cecum, identified by                            appendiceal orifice and ileocecal  valve. The                            colonoscopy was performed without difficulty. The                            patient tolerated the procedure well. The quality                            of the bowel preparation was good. The bowel                            preparation used was SUPREP via split dose                            instruction. Scope In: 10:15:54 AM Scope Out: 10:33:15 AM Scope Withdrawal Time: 0 hours 14 minutes 1 second  Total Procedure Duration: 0 hours 17 minutes 21 seconds  Findings:                 The perianal and digital rectal examinations were                            normal.                           Three sessile polyps were found in the sigmoid                            colon and transverse colon. The polyps were                            diminutive in size. These polyps were removed with                            a cold snare. Resection and retrieval were                            complete. Verification of patient identification  for the specimen was done. Estimated blood loss was                            minimal.                           The exam was otherwise without abnormality on                            direct and retroflexion views. Complications:            No immediate complications. Estimated Blood Loss:     Estimated blood loss was minimal. Impression:               - Three diminutive polyps in the sigmoid colon (1)                            and in the transverse colon (2), removed with a                            cold snare. Resected and retrieved.                           - The examination was otherwise normal on direct                            and retroflexion views. Recommendation:           - Patient has a contact number available for                            emergencies. The signs and symptoms of potential                            delayed complications were discussed with the                             patient. Return to normal activities tomorrow.                            Written discharge instructions were provided to the                            patient.                           - Resume previous diet.                           - Continue present medications.                           - Repeat colonoscopy is recommended. The                            colonoscopy date will be determined after pathology  results from today's exam become available for                            review. Iva Boop, MD 02/10/2023 10:40:58 AM This report has been signed electronically.

## 2023-02-10 NOTE — Patient Instructions (Addendum)
I found and removed 3 polyps today - small and benign-appearing. Likely pre-cancerous, which is common and since removed they will not turn into cancer.  All else ok.  I will let you know pathology results and when to have another routine colonoscopy by mail and/or My Chart.  I appreciate the opportunity to care for you. Iva Boop, MD, FACG   YOU HAD AN ENDOSCOPIC PROCEDURE TODAY AT THE [facility]:   Refer to the procedure report that was given to you for any specific questions about what was found during the examination.  If the procedure report does not answer your questions, please call your gastroenterologist to clarify.  If you requested that your care partner not be given the details of your procedure findings, then the procedure report has been included in a sealed envelope for you to review at your convenience later.  YOU SHOULD EXPECT: Some feelings of bloating in the abdomen. Passage of more gas than usual.  Walking can help get rid of the air that was put into your GI tract during the procedure and reduce the bloating. If you had a lower endoscopy (such as a colonoscopy or flexible sigmoidoscopy) you may notice spotting of blood in your stool or on the toilet paper. If you underwent a bowel prep for your procedure, you may not have a normal bowel movement for a few days.  Please Note:  You might notice some irritation and congestion in your nose or some drainage.  This is from the oxygen used during your procedure.  There is no need for concern and it should clear up in a day or so.  SYMPTOMS TO REPORT IMMEDIATELY:  Following lower endoscopy (colonoscopy or flexible sigmoidoscopy):  Excessive amounts of blood in the stool  Significant tenderness or worsening of abdominal pains  Swelling of the abdomen that is new, acute  Fever of 100F or higher  For urgent or emergent issues, a gastroenterologist can be reached at any hour by calling [phone]. Do not use  MyChart messaging for urgent concerns.    DIET:  We do recommend a small meal at first, but then you may proceed to your regular diet.  Drink plenty of fluids but you should avoid alcoholic beverages for 24 hours.  ACTIVITY:  You should plan to take it easy for the rest of today and you should NOT DRIVE or use heavy machinery until tomorrow (because of the sedation medicines used during the test).    FOLLOW UP: Our staff will call the number listed on your records the next business day following your procedure.  We will call around 7:15- 8:00 am to check on you and address any questions or concerns that you may have regarding the information given to you following your procedure. If we do not reach you, we will leave a message.     If any biopsies were taken you will be contacted by phone or by letter within the next 1-3 weeks.  Please call us at [phone] if you have not heard about the biopsies in 3 weeks.    SIGNATURES/CONFIDENTIALITY: You and/or your care partner have signed paperwork which will be entered into your electronic medical record.  These signatures attest to the fact that that the information above on your After Visit Summary has been reviewed and is understood.  Full responsibility of the confidentiality of this discharge information lies with you and/or your care-partner.

## 2023-02-10 NOTE — Progress Notes (Signed)
Report to PACU, RN, vss, BBS= Clear.  

## 2023-02-10 NOTE — Progress Notes (Signed)
Called to room to assist during endoscopic procedure.  Patient ID and intended procedure confirmed with present staff. Received instructions for my participation in the procedure from the performing physician.  

## 2023-02-11 ENCOUNTER — Telehealth: Payer: Self-pay

## 2023-02-11 NOTE — Telephone Encounter (Signed)
  Follow up Call-     02/10/2023    9:27 AM  Call back number  Post procedure Call Back phone  # [phone]  Permission to leave phone message Yes     Patient questions:  Do you have a fever, pain , or abdominal swelling? No. Pain Score  0 *  Have you tolerated food without any problems? Yes.    Have you been able to return to your normal activities? Yes.    Do you have any questions about your discharge instructions: Diet   No. Medications  No. Follow up visit  No.  Do you have questions or concerns about your Care? No.  Actions: * If pain score is 4 or above: No action needed, pain <4.

## 2023-02-12 LAB — SURGICAL PATHOLOGY

## 2023-02-13 ENCOUNTER — Encounter: Payer: Self-pay | Admitting: Internal Medicine

## 2023-02-15 ENCOUNTER — Ambulatory Visit: Payer: No Typology Code available for payment source | Admitting: Physician Assistant

## 2023-02-15 ENCOUNTER — Encounter: Payer: Self-pay | Admitting: Physician Assistant

## 2023-02-15 DIAGNOSIS — M170 Bilateral primary osteoarthritis of knee: Secondary | ICD-10-CM | POA: Diagnosis not present

## 2023-02-15 DIAGNOSIS — M1712 Unilateral primary osteoarthritis, left knee: Secondary | ICD-10-CM

## 2023-02-15 DIAGNOSIS — M1711 Unilateral primary osteoarthritis, right knee: Secondary | ICD-10-CM

## 2023-02-15 MED ORDER — METHYLPREDNISOLONE ACETATE 40 MG/ML IJ SUSP
40.0000 mg | INTRAMUSCULAR | Status: AC | PRN
  Administered 2023-02-15: 40 mg via INTRA_ARTICULAR

## 2023-02-15 MED ORDER — LIDOCAINE HCL 1 % IJ SOLN
3.0000 mL | INTRAMUSCULAR | Status: AC | PRN
  Administered 2023-02-15: 3 mL

## 2023-02-15 NOTE — Progress Notes (Signed)
   Procedure Note  Patient: Mary Herman             Date of Birth: 10-25-1960           MRN: [id number]             Visit Date: 02/15/2023 HPI: Mary Herman returns today requesting cortisone injections both knees.  Last injections were given on 11/16/2022.  He states that injections were beneficial.  She is unsure of how long they gave her significant relief.  She denies any fevers chills.  Denies any new injuries.  Review of systems: See HPI otherwise negative or noncontributory.  Bilateral knees: Good range of motion of both knees.  Patellofemoral crepitus with range of motion.  Tenderness along medial joint line of both knees.  No abnormal warmth erythema or effusion of either knee. Procedures: Visit Diagnoses:  1. Unilateral primary osteoarthritis, left knee   2. Unilateral primary osteoarthritis, right knee     Large Joint Inj: bilateral knee on 02/15/2023 11:21 AM Indications: pain Details: 22 G 1.5 in needle, anterolateral approach  Arthrogram: No  Medications (Right): 3 mL lidocaine 1 %; 40 mg methylPREDNISolone acetate 40 MG/ML Medications (Left): 3 mL lidocaine 1 %; 40 mg methylPREDNISolone acetate 40 MG/ML Outcome: tolerated well, no immediate complications Procedure, treatment alternatives, risks and benefits explained, specific risks discussed. Consent was given by the patient. Immediately prior to procedure a time out was called to verify the correct patient, procedure, equipment, support staff and site/side marked as required. Patient was prepped and draped in the usual sterile fashion.    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  Follow-up with [place] as needed she knows to wait least 3 months between injections.  As of this time she does not want to undergo knee replacement as she is recently been told that she unfortunately be laid off from her job in the near future.

## 2023-02-22 ENCOUNTER — Ambulatory Visit: Payer: No Typology Code available for payment source | Admitting: Orthopaedic Surgery

## 2023-03-17 ENCOUNTER — Telehealth: Payer: Self-pay | Admitting: Pharmacist Clinician (PhC)/ Clinical Pharmacy Specialist

## 2023-03-17 NOTE — Telephone Encounter (Signed)
Patient will mail updated [employer] forms for Ozempic

## 2023-03-17 NOTE — Telephone Encounter (Signed)
  Pt c/o medication issue:  1. Name of Medication: Semaglutide, 2 MG/DOSE, (OZEMPIC, 2 MG/DOSE,) 8 MG/3ML SOPN   2. How are you currently taking this medication (dosage and times per day)?   3. Are you having a reaction (difficulty breathing--STAT)?   4. What is your medication issue? Pt is requesting to speak with PharmD Belenda Cruise regarding this medication

## 2023-03-19 ENCOUNTER — Other Ambulatory Visit: Payer: Self-pay | Admitting: Internal Medicine

## 2023-03-27 ENCOUNTER — Other Ambulatory Visit: Payer: Self-pay | Admitting: Internal Medicine

## 2023-04-09 ENCOUNTER — Other Ambulatory Visit (HOSPITAL_COMMUNITY): Payer: Self-pay

## 2023-04-09 MED ORDER — HYDROCODONE-ACETAMINOPHEN 5-325 MG PO TABS
0.5000 | ORAL_TABLET | Freq: Two times a day (BID) | ORAL | 0 refills | Status: DC | PRN
Start: 2023-04-09 — End: 2023-12-03
  Filled 2023-04-09: qty 30, 15d supply, fill #0

## 2023-04-13 ENCOUNTER — Telehealth: Payer: Self-pay | Admitting: *Deleted

## 2023-04-13 ENCOUNTER — Encounter: Payer: Self-pay | Admitting: Internal Medicine

## 2023-04-13 NOTE — Telephone Encounter (Signed)
Patient called and made writer aware that she spoke to [place], [place] D and she was waiting on Ozempic to be sent to office. Per Roney Marion D Ozempic has not been sent to office.  Patient  made aware to call [facility] to see where and when Ozempic would be shipped. Patient provided with number to call [facility] and verbalizes an understanding.

## 2023-04-26 ENCOUNTER — Other Ambulatory Visit: Payer: Self-pay | Admitting: Internal Medicine

## 2023-04-26 DIAGNOSIS — R8290 Unspecified abnormal findings in urine: Secondary | ICD-10-CM

## 2023-05-02 ENCOUNTER — Other Ambulatory Visit: Payer: Self-pay | Admitting: Internal Medicine

## 2023-05-06 ENCOUNTER — Other Ambulatory Visit (HOSPITAL_COMMUNITY): Payer: Self-pay

## 2023-05-06 MED ORDER — HYDROCODONE-ACETAMINOPHEN 5-325 MG PO TABS
ORAL_TABLET | ORAL | 0 refills | Status: DC
  Filled 2023-05-06 – 2023-05-10 (×2): qty 30, 30d supply, fill #0

## 2023-05-09 ENCOUNTER — Other Ambulatory Visit: Payer: Self-pay | Admitting: Internal Medicine

## 2023-05-10 ENCOUNTER — Other Ambulatory Visit: Payer: Self-pay

## 2023-05-10 ENCOUNTER — Other Ambulatory Visit (HOSPITAL_COMMUNITY): Payer: Self-pay

## 2023-05-13 ENCOUNTER — Telehealth: Payer: Self-pay | Admitting: Internal Medicine

## 2023-05-13 NOTE — Telephone Encounter (Signed)
 Copied from CRM [id number]. Topic: General - Other >> May 12, 2023  2:59 PM Victorino Dike T wrote: Reason for CRM: Patient lost job at the beginning of the year so has no insurance and would like to know self pay pricing for her physical coming up- please call [phone]

## 2023-05-13 NOTE — Telephone Encounter (Signed)
 Called patient and went over pricing

## 2023-05-20 NOTE — Progress Notes (Signed)
 Annual Wellness Visit   Patient Care Team: Nikka Hakimian, Luanna Cole, MD as PCP - General (Internal Medicine) Maisie Fus, MD as PCP - Cardiology (Cardiology) Himmelrich, Loree Fee, RD (Inactive) as Dietitian  Visit Date: 05/21/23   Chief Complaint  Patient presents with   Annual Exam       Subjective:  Patient: Mary Herman, Female DOB: 11-20-1960, 63 y.o. MRN: [id number] Mary Herman is a 63 y.o. Female who presents today for her Annual Wellness Visit. Patient has history of  Chronic Back Pain, Depression, Dyspnea, Environmental Allergies,  GERD,  Hypertension, Kidney Stones, Obstructive Sleep Apnea,   Rheumatoid Arthritis, Right Ureteral Stone, hyperlipidemia, Trochanteric Bursitis, Both Hips.   History of Hypertension treated with Metoprolol succinate 25 mg daily. Blood Pressure: normotensive today at 122/80.   History of Hyperlipidemia treated with Rosuvastatin 5 mg 3x/week. 11/24/2022 Lipid Panel is normal  History of Impaired Glucose Tolerance 11/24/2022 HgbA1c 5.0%, repeat collected today.  BMI 31.91, weight 180 LBS 1.9 oz today, decreased from 236 lb 1.9 oz, BMI 42.16 in 04/2022. Takes Ozempic 2 mg weekly.  History of Hypothyroidism treated with Synthroid 75 mcg daily.  On 05/15/2022 TSH was normal at 0.50.   Followed by Dr. Marene Lenz w/ ENTfor Chronic Maxillary Sinusitis; Seasonal Allergies.  History of Rheumatoid Arthritis currently treated with Arava 20 mg daily. She will soon be going on Hyrimoz 0.4 cc subcutaneous every other week. Patient is seen at [facility] Rheumatology.   History of depression treated with Wellbutrin XL 300 mg daily, Celexa 20 mg daily in the morning. Insomnia and anxiety treated with Valium 5 mg at bedtime if needed.   Her husband passed away recently, possibly from a sudden cardiac event. She hasn't been sleeping well. He wore a monitor, so she's hoping to get some information regarding his sudden passing. Valium has been prescribed for her  today  to help her sleep at night. She is residing alone.     History of chronic back pain treated with Celebrex 200 mg twice daily, Flexeril 10 mg daily at bedtime.   Labs will be collected in 6 weeks: CBC, CMP, Lipid Panel, HgbA1c, Microalbumin/Creatinine, TSH  Pap smear last completed 05/17/17. Negative for intraepithelial lesion or malignancy.    Mammogram last completed 05/13/21. No mammographic evidence of malignancy. Recommended repeat in 2025.   Plans to do colonoscopy this year.   DEXA scan last completed 07/07/21. T-score at -1.3 at right femoral neck. She has osteopenia. Recommended repeat in 2025.  Vaccine Counseling: does not want Covid-19 booster. Consider Zostavax when feeling better. Past Medical History:  Diagnosis Date   Anemia    Chronic back pain    Depression    Diabetes mellitus without complication (HCC)    takes on sundays - semaglutide   Dyspnea    with activity   Environmental allergies    Family history of adverse reaction to anesthesia    daughter- n/v       GERD (gastroesophageal reflux disease)    Hematuria    History of cystitis    INTERSTITIAL CYSTITIS--  NO ISSUES FOR YRS   History of gastroesophageal reflux (GERD)    DENIES ISSUES SINCE GASTRIC BY PASS   History of hypertension    NO ISSUE SINCE WT LOSS AFTER GASTRIC BYPASS   History of kidney stones    surgery to remove   History of obstructive sleep apnea    USED CPAP--  NO ISSUE SINCE WT LOSS AFTER GASTRIC BYPASS  Hypertension    Hypothyroidism    Pneumonia    x 1   Pre-diabetes 06/2021   Rheumatoid arthritis (HCC)    Right ureteral stone    Sleep apnea    does not use cpap   Trochanteric bursitis of both hips    Tuberculosis    patient denies this dx as of 09/09/22    Medical/Surgical History Narrative:  Family History  Problem Relation Age of Onset   Colon polyps Mother    Hypertension Mother    Stroke Mother    Hypertension Brother    Heart disease Maternal Uncle    Diabetes  Maternal Grandmother    Cancer Maternal Grandmother        THROAT CA   Cancer Maternal Grandfather        THROAT CA   Esophageal cancer Paternal Grandmother    Colon cancer Neg Hx    Rectal cancer Neg Hx    Stomach cancer Neg Hx     Family History Narrative: Father died at age 47 with history of alcoholism but cause of death not known to patient.  Brother with history of brain aneurysm.  Mother died at age 20 with history of stroke and had lung disease.  No sisters.  1 brother with history of hypertension and another brother in his 19s whose health status not known to patient.   Social history: Has worked in Marketing executive.  Most recently worked at [employer].  Also has worked in a Radio broadcast assistant.  Non-smoker.  Does not consume alcohol.  She has 1 daughter who has had a number of operations.  She has a high school education.  Does not consume alcohol.  Patient had gastric bypass surgery in 2009.  Had surgery for complex atypical endometrial hyperplasia including total abdominal hysterectomy BSO with no cancer being detected in December 2020.  She is allergic to cats.  Codeine causes a rash.  Treated for Rheumatoid arthritis at [facility] rheumatology.  Has had steroid injections in both knees by Dr. Magnus Ivan and has been diagnosed with primary osteoarthritis of both knees.  ROS  Objective:  Vitals: BP 122/80 (BP Location: Left Arm, Patient Position: Sitting, Cuff Size: Normal)   Pulse 81   Temp (!) 97.5 F (36.4 C) (Temporal)   Resp 12   Ht 5\' 3"  (1.6 m)   Wt 180 lb 1.9 oz (81.7 kg)   LMP 05/25/2014   SpO2 99%   BMI 31.91 kg/m  Physical Exam Most Recent Functional Status Assessment:    09/25/2022    7:58 AM  In your present state of health, do you have any difficulty performing the following activities:  Doing errands, shopping? 0   Most Recent Fall Risk Assessment:    05/21/2023   11:00 AM  Fall Risk   Falls in the past year? 0  Number falls in past yr: 0   Injury with Fall? 0  Risk for fall due to : No Fall Risks   Most Recent Depression Screenings:    05/21/2023   11:28 AM 05/15/2022   10:00 AM  PHQ 2/9 Scores  PHQ - 2 Score 3 0  PHQ- 9 Score 12    Most Recent Cognitive Screening:     No data to display         Results:  Studies Obtained And Personally Reviewed By Me:    Labs:     Component Value Date/Time   NA 138 09/25/2022 0803   K 3.9 09/25/2022 0803  CL 106 09/25/2022 0803   CO2 19 (L) 09/25/2022 0803   GLUCOSE 103 (H) 09/25/2022 0803   BUN 11 09/25/2022 0803   CREATININE 0.96 09/25/2022 0803   CREATININE 0.98 05/15/2022 1043   CALCIUM 8.6 (L) 09/25/2022 0803   PROT 6.8 11/24/2022 0947   ALBUMIN 3.7 01/27/2019 0855   AST 37 (H) 11/24/2022 0947   ALT 39 (H) 11/24/2022 0947   ALKPHOS 118 01/27/2019 0855   BILITOT 0.9 11/24/2022 0947   GFRNONAA >60 09/25/2022 0803   GFRNONAA 82 05/10/2020 1005   GFRAA 95 05/10/2020 1005    Lab Results  Component Value Date   WBC 3.5 (L) 09/25/2022   HGB 13.0 09/25/2022   HCT 41.4 09/25/2022   MCV 92.0 09/25/2022   PLT 163 09/25/2022   Lab Results  Component Value Date   CHOL 166 11/24/2022   HDL 88 11/24/2022   LDLCALC 61 11/24/2022   TRIG 83 11/24/2022   CHOLHDL 1.9 11/24/2022   Lab Results  Component Value Date   HGBA1C 5.0 11/24/2022    Lab Results  Component Value Date   TSH 0.50 05/15/2022     Assessment & Plan:       Grief-recently lost husband suddenly.  Hopes to get answers in the near future as to why he passed away.  Offered condolences.  Primary osteoarthritis both knees treated by Dr. Magnus Ivan with steroid injections  History of Rheumatoid arthritis followed by [facility] rheumatology and treated with DMARD  Chronic musculoskeletal pain  Hypothyroidism treated with thyroid replacement medication  History of vitamin D deficiency  Allergic rhinitis treated with Xyzal and Singulair  Hyperlipidemia treated with Crestor  Impaired  glucose tolerance treated with diet only.  Hemoglobin A1c excellent 5%  Patient had positive Cologuard in 2024 and subsequently underwent colonoscopy done in December 2024 by Dr. Leone Payor who found 3 polyps.  2 polyps were tubular adenomas and 1 was a hyperplastic polyp and  repeat study recommended in 7 years  Has abnormal urine specimen and culture will be sent  Plan: Patient would like to wait until May to have fasting labs and I think this is acceptable.  She will follow-up here again in in the Fall.  Condolences offered to her and loss of her husband.     Orders Placed This Encounter  Procedures   Urine Culture   CT CARDIAC SCORING (SELF PAY ONLY)   POCT urinalysis dipstick          Annual wellness visit done today including the all of the following: Reviewed patient's Family Medical History Reviewed and updated list of patient's medical providers Assessment of cognitive impairment was done Assessed patient's functional ability Established a written schedule for health screening services Health Risk Assessent Completed and Reviewed  Discussed health benefits of physical activity, and encouraged her to engage in regular exercise appropriate for her age and condition.    I,Emily Lagle,acting as a Neurosurgeon for Margaree Mackintosh, MD.,have documented all relevant documentation on the behalf of Margaree Mackintosh, MD,as directed by  Margaree Mackintosh, MD  I, Margaree Mackintosh, MD, have reviewed all documentation for this visit. The documentation on 05/22/23 for the exam, diagnosis, procedures, and orders are all accurate and complete.

## 2023-05-21 ENCOUNTER — Ambulatory Visit (INDEPENDENT_AMBULATORY_CARE_PROVIDER_SITE_OTHER): Payer: Self-pay | Admitting: Internal Medicine

## 2023-05-21 ENCOUNTER — Encounter: Payer: Self-pay | Admitting: Internal Medicine

## 2023-05-21 ENCOUNTER — Other Ambulatory Visit (INDEPENDENT_AMBULATORY_CARE_PROVIDER_SITE_OTHER): Payer: Self-pay

## 2023-05-21 VITALS — BP 122/80 | HR 81 | Temp 97.5°F | Resp 12 | Ht 63.0 in | Wt 180.1 lb

## 2023-05-21 DIAGNOSIS — F419 Anxiety disorder, unspecified: Secondary | ICD-10-CM

## 2023-05-21 DIAGNOSIS — E039 Hypothyroidism, unspecified: Secondary | ICD-10-CM

## 2023-05-21 DIAGNOSIS — F32A Depression, unspecified: Secondary | ICD-10-CM

## 2023-05-21 DIAGNOSIS — E1169 Type 2 diabetes mellitus with other specified complication: Secondary | ICD-10-CM

## 2023-05-21 DIAGNOSIS — Z029 Encounter for administrative examinations, unspecified: Secondary | ICD-10-CM

## 2023-05-21 DIAGNOSIS — M059 Rheumatoid arthritis with rheumatoid factor, unspecified: Secondary | ICD-10-CM

## 2023-05-21 DIAGNOSIS — M170 Bilateral primary osteoarthritis of knee: Secondary | ICD-10-CM

## 2023-05-21 DIAGNOSIS — R8281 Pyuria: Secondary | ICD-10-CM

## 2023-05-21 DIAGNOSIS — E559 Vitamin D deficiency, unspecified: Secondary | ICD-10-CM

## 2023-05-21 DIAGNOSIS — E7800 Pure hypercholesterolemia, unspecified: Secondary | ICD-10-CM

## 2023-05-21 DIAGNOSIS — Z136 Encounter for screening for cardiovascular disorders: Secondary | ICD-10-CM

## 2023-05-21 DIAGNOSIS — Z0000 Encounter for general adult medical examination without abnormal findings: Secondary | ICD-10-CM

## 2023-05-21 DIAGNOSIS — F4321 Adjustment disorder with depressed mood: Secondary | ICD-10-CM

## 2023-05-21 DIAGNOSIS — Z9884 Bariatric surgery status: Secondary | ICD-10-CM

## 2023-05-21 DIAGNOSIS — E1165 Type 2 diabetes mellitus with hyperglycemia: Secondary | ICD-10-CM

## 2023-05-21 LAB — POCT URINALYSIS DIP (MANUAL ENTRY)
Bilirubin, UA: NEGATIVE
Blood, UA: NEGATIVE
Glucose, UA: NEGATIVE mg/dL
Ketones, POC UA: NEGATIVE mg/dL
Nitrite, UA: NEGATIVE
Protein Ur, POC: NEGATIVE mg/dL
Spec Grav, UA: 1.01 (ref 1.010–1.025)
Urobilinogen, UA: 0.2 U/dL
pH, UA: 6 (ref 5.0–8.0)

## 2023-05-22 LAB — URINE CULTURE
MICRO NUMBER:: 16261071
Result:: NO GROWTH
SPECIMEN QUALITY:: ADEQUATE

## 2023-05-22 NOTE — Patient Instructions (Addendum)
 We are so very sorry to learn of your husband's passing.  Please accept our condolences.  We have agreed you will wait 6 weeks to obtain fasting labs.  We performed her annual physical exam today.  Continue current medications. We have ordered coronary calcium scoring.  Please call for an appointment for that study.  We will plan to see you again in 6 months or as needed.

## 2023-05-25 ENCOUNTER — Telehealth: Payer: Self-pay | Admitting: Internal Medicine

## 2023-05-25 MED ORDER — SERTRALINE HCL 50 MG PO TABS: 50.0000 mg | ORAL_TABLET | Freq: Every day | ORAL | 0 refills | Status: DC

## 2023-05-25 NOTE — Addendum Note (Signed)
 Addended by: Donnamarie Poag on: 05/25/2023 12:21 PM   Modules accepted: Orders

## 2023-05-25 NOTE — Telephone Encounter (Signed)
 Called patient states that at last visit you were going to send in Zoloft for patient. She didn't get that sent in. Would like called into [facility].

## 2023-05-25 NOTE — Telephone Encounter (Signed)
 Called patient reviewed all information and repeated back to me. Will call if any questions.  She will call when she is on last week of Zoloft to give update.

## 2023-05-25 NOTE — Telephone Encounter (Signed)
 Copied from CRM [id number]. Topic: Clinical - Medication Question >> May 25, 2023 10:38 AM Carlatta H wrote: Reason for CRM: Patient has a question about Zoloft//Please return call

## 2023-05-25 NOTE — Telephone Encounter (Signed)
 Left message to return call to our office.

## 2023-05-26 NOTE — Progress Notes (Signed)
 Called to let Mary Herman know that the culture showed no growth, no UTI, she verbalized understanding.

## 2023-05-31 ENCOUNTER — Ambulatory Visit: Payer: Self-pay | Admitting: Orthopaedic Surgery

## 2023-06-01 ENCOUNTER — Telehealth: Payer: Self-pay | Admitting: *Deleted

## 2023-06-01 NOTE — Telephone Encounter (Signed)
 Copied from CRM [id number]. Topic: Clinical - Medical Advice >> Jun 01, 2023  2:10 PM Clayton Bibles wrote: Reason for CRM:  Mary Herman would like someone to call the billing department to let them know she has no insurance for the Urine culture and test so she can get the no insurance discount. Please message her through MyChart

## 2023-06-01 NOTE — Telephone Encounter (Signed)
 [facility] has been called and they have been applied the UPP and patient has been notified as well.

## 2023-06-07 ENCOUNTER — Other Ambulatory Visit (HOSPITAL_COMMUNITY): Payer: Self-pay

## 2023-06-07 MED ORDER — HYDROCODONE-ACETAMINOPHEN 5-325 MG PO TABS
0.5000 | ORAL_TABLET | Freq: Two times a day (BID) | ORAL | 0 refills | Status: DC | PRN
  Filled 2023-06-09: qty 30, 15d supply, fill #0

## 2023-06-08 ENCOUNTER — Other Ambulatory Visit (HOSPITAL_COMMUNITY): Payer: Self-pay

## 2023-06-09 ENCOUNTER — Other Ambulatory Visit (HOSPITAL_COMMUNITY): Payer: Self-pay

## 2023-06-18 ENCOUNTER — Telehealth: Payer: Self-pay

## 2023-06-18 NOTE — Telephone Encounter (Signed)
 Fax notice received that shipment is being processed for patient's Ozempic  for 120 supply that should arrive within 10 to 14 business days.

## 2023-06-21 ENCOUNTER — Other Ambulatory Visit: Payer: Self-pay | Admitting: Orthopaedic Surgery

## 2023-06-21 ENCOUNTER — Telehealth: Payer: Self-pay | Admitting: Orthopaedic Surgery

## 2023-06-21 MED ORDER — METHYLPREDNISOLONE 4 MG PO TABS: ORAL_TABLET | ORAL | 0 refills | Status: DC

## 2023-06-21 NOTE — Telephone Encounter (Signed)
 Patient called and said she is having extreme pain in her left knee. She said she tried everything. She ask if you could call in some steroids for her knees. [phone]

## 2023-06-25 ENCOUNTER — Other Ambulatory Visit: Payer: Self-pay

## 2023-06-25 ENCOUNTER — Telehealth: Payer: Self-pay | Admitting: Internal Medicine

## 2023-06-25 ENCOUNTER — Ambulatory Visit: Payer: Self-pay

## 2023-06-25 MED ORDER — SERTRALINE HCL 100 MG PO TABS: 100.0000 mg | ORAL_TABLET | Freq: Every day | ORAL | 3 refills | Status: DC

## 2023-06-25 NOTE — Telephone Encounter (Signed)
 Sent to Dr. Liane Redman for review

## 2023-06-25 NOTE — Telephone Encounter (Signed)
 Spoke with patient still having fatigue, grief and insomnia. Increase Zoloft  to 100 mg daily. Follow up here soon.

## 2023-06-25 NOTE — Telephone Encounter (Signed)
 Spoke with patient by phone.  Is on Zoloft  50 mg daily for grief and depression.  Still having some issues sleeping and appropriate grief reaction.  She is scheduled to have upcoming labs here next week but has also had labs at [facility]ology.  I have reviewed what they have ordered recently and I think all we really need to check with her is free T4 and TSH.  She will also need a hemoglobin A1c.  I am increasing Zoloft  to 100 mg daily and have sent in a new prescription for 30 tablets pending her follow-up appointment here. MJB, MD

## 2023-06-25 NOTE — Telephone Encounter (Signed)
 This RN made first attempt to contact patient. No answer, LVM. Routing for additional attempts.  Copied from CRM [id number]. Topic: Clinical - Medical Advice >> Jun 25, 2023  9:22 AM Stanly Early wrote: Reason for CRM: sertraline  (ZOLOFT ) 50 MG tablet needs a refill but would like a callback [phone]

## 2023-06-28 ENCOUNTER — Ambulatory Visit (INDEPENDENT_AMBULATORY_CARE_PROVIDER_SITE_OTHER): Payer: Self-pay | Admitting: Orthopaedic Surgery

## 2023-06-28 ENCOUNTER — Encounter: Payer: Self-pay | Admitting: Orthopaedic Surgery

## 2023-06-28 DIAGNOSIS — M25561 Pain in right knee: Secondary | ICD-10-CM

## 2023-06-28 DIAGNOSIS — G8929 Other chronic pain: Secondary | ICD-10-CM

## 2023-06-28 DIAGNOSIS — M25562 Pain in left knee: Secondary | ICD-10-CM

## 2023-06-28 NOTE — Progress Notes (Signed)
 The patient is a 63 year old well-known to me.  She has bilateral knee osteoarthritis.  She has been on weight loss journey has lost significant weight and was going consider knee replacement surgeries but unfortunately she lost her job at the end of the year due to being laid off.  Then in February of this year her husband suddenly passed away with a heart attack.  This is really been ahead for her.  She says she has no health insurance at this point as well.  She has lost significant amount of weight and that is helped her knee somewhat but she does need steroid injections in both knees today.  Both knees have varus malalignment and known bone-on-bone wear with global tenderness especially the medial joint line of both knees.  I did place a steroid injection in both knees today which she tolerated well.  We can repeat this again in 3 months.

## 2023-06-29 ENCOUNTER — Telehealth: Payer: Self-pay | Admitting: Pharmacy Technician

## 2023-06-29 ENCOUNTER — Other Ambulatory Visit: Payer: Self-pay | Admitting: Internal Medicine

## 2023-06-29 NOTE — Telephone Encounter (Signed)
 Sent email to [facility] for refill form for ozempic  to change address to new location and scanned in media

## 2023-06-30 ENCOUNTER — Telehealth: Payer: Self-pay | Admitting: Internal Medicine

## 2023-06-30 DIAGNOSIS — R7302 Impaired glucose tolerance (oral): Secondary | ICD-10-CM

## 2023-06-30 NOTE — Telephone Encounter (Signed)
 Signed order with dx needed for pt to have coronary calcium  score.  Patient was told insurance might not cover this when it was discussed and agreed to out of  pocket expense.

## 2023-07-01 ENCOUNTER — Other Ambulatory Visit: Payer: Self-pay

## 2023-07-01 ENCOUNTER — Encounter (HOSPITAL_COMMUNITY): Payer: Self-pay

## 2023-07-01 ENCOUNTER — Ambulatory Visit (HOSPITAL_COMMUNITY)
Admission: RE | Admit: 2023-07-01 | Discharge: 2023-07-01 | Disposition: A | Discharge status: Home / Self Care | Payer: Self-pay | Source: Ambulatory Visit | Attending: Internal Medicine | Admitting: Internal Medicine

## 2023-07-01 DIAGNOSIS — R7302 Impaired glucose tolerance (oral): Secondary | ICD-10-CM | POA: Insufficient documentation

## 2023-07-01 DIAGNOSIS — E039 Hypothyroidism, unspecified: Secondary | ICD-10-CM

## 2023-07-02 LAB — TSH: TSH: 1.36 m[IU]/L (ref 0.40–4.50)

## 2023-07-02 LAB — T4, FREE: Free T4: 1.2 ng/dL (ref 0.8–1.8)

## 2023-07-09 ENCOUNTER — Other Ambulatory Visit (HOSPITAL_COMMUNITY): Payer: Self-pay

## 2023-07-09 MED ORDER — HYDROCODONE-ACETAMINOPHEN 5-325 MG PO TABS
1.0000 | ORAL_TABLET | Freq: Two times a day (BID) | ORAL | 0 refills | Status: AC
Start: 2023-07-09 — End: ?
  Filled 2023-07-09: qty 45, 30d supply, fill #0

## 2023-08-04 ENCOUNTER — Ambulatory Visit: Payer: Self-pay

## 2023-08-04 NOTE — Telephone Encounter (Signed)
 FYI Only or Action Required?: FYI only for provider  Patient was last seen in primary care on 05/21/2023 by Sylvan Evener, MD. Called Nurse Triage reporting Pruritis. Symptoms began several days ago. Interventions attempted: OTC medications: Benadryl . Symptoms are: moderate to severe itching to chest/chin/neck/face/ears, swelling to ears gradually worsening.  Triage Disposition: See PCP When Office is Open (Within 3 Days)  Patient/caregiver understands and will follow disposition?: Yes                          Copied From CRM [id number]. Reason for Triage: Patient called.. has allergic reaction she thinks it's from sunscreen ( itching )    Reason for Disposition  [1] MODERATE-SEVERE local itching (i.e., interferes with work, school, activities) AND [2] not improved after 24 hours of hydrocortisone cream  Answer Assessment - Initial Assessment Questions 1. DESCRIPTION: Describe the itching you are having. Where is it located?     Warm, itchy spots to face/ears/chin/chest/neck. She states her ears feel swollen.  2. SEVERITY: How bad is it?    - MILD: Doesn't interfere with normal activities.   - MODERATE-SEVERE: Interferes with work, school, sleep, or other activities.      Moderate-severe.  3. SCRATCHING: Are there any scratch marks? Bleeding?     No scratch marks or bleeding.  4. ONSET: When did the itching begin?      She states she used sunscreen on Monday and by Monday night the itching began.  5. CAUSE: What do you think is causing the itching?      She thinks it may be related to the sunscreen she used, possible allergic reaction.  6. OTHER SYMPTOMS: Do you have any other symptoms?      No.  7. PREGNANCY: Is there any chance you are pregnant? When was your last menstrual period?     N/A.  Patient denies any difficulty breathing, tongue swelling, rash, blisters. Patient states she is uninsured and requested an estimate. Called [employer] and  spoke with Fabian Holster, estimate for tomorrow's visit is $175 and after discoun $63 for patient. Patient aware and states she will keep her appointment.  Protocols used: Itching - Localized-A-AH

## 2023-08-05 ENCOUNTER — Ambulatory Visit (INDEPENDENT_AMBULATORY_CARE_PROVIDER_SITE_OTHER): Payer: Self-pay | Admitting: Internal Medicine

## 2023-08-05 VITALS — BP 120/80 | HR 81 | Ht 63.0 in | Wt 173.0 lb

## 2023-08-05 DIAGNOSIS — F419 Anxiety disorder, unspecified: Secondary | ICD-10-CM

## 2023-08-05 DIAGNOSIS — E1165 Type 2 diabetes mellitus with hyperglycemia: Secondary | ICD-10-CM

## 2023-08-05 DIAGNOSIS — Z634 Disappearance and death of family member: Secondary | ICD-10-CM

## 2023-08-05 DIAGNOSIS — L299 Pruritus, unspecified: Secondary | ICD-10-CM

## 2023-08-05 MED ORDER — HYDROXYZINE PAMOATE 25 MG PO CAPS: 25.0000 mg | ORAL_CAPSULE | Freq: Three times a day (TID) | ORAL | 0 refills | Status: AC | PRN

## 2023-08-05 MED ORDER — METHYLPREDNISOLONE 4 MG PO TABS: ORAL_TABLET | ORAL | 0 refills | Status: DC

## 2023-08-05 NOTE — Progress Notes (Signed)
 Patient Care Team: Sylvan Evener, MD as PCP - General (Internal Medicine) Amanda Jungling Tomas Fountain, MD (Inactive) as PCP - Cardiology (Cardiology) Himmelrich, Jesslyn Moro, RD (Inactive) as Dietitian  Visit Date: 08/05/23  Subjective:   Chief Complaint  Patient presents with   Pruritis    Started on Monday night, itching from chest up    Patient ZO:XWRUE Mary Herman,Female DOB:09/07/1960,63 y.o. AVW:[id number]   63 y.o.Female presents today for acute visit with Pruritus. [place] that [place] used a different sunscreen than usual, and since then her entire upper body has been itching - has been using Benadryl  for relief.   Bereavement due to her husband's passing in February, still grieving, and is managed with Wellbutrin -XL 300 mg daily and Zoloft  100 mg daily - also has Valium  5 mg as needed for sleep. She does say that she is looking forward to new grandchild and has plenty of support.  Past Medical History:  Diagnosis Date   Anemia    Chronic back pain    Depression    Diabetes mellitus without complication (HCC)    takes on sundays - semaglutide    Dyspnea    with activity   Environmental allergies    Family history of adverse reaction to anesthesia    daughter- n/v       GERD (gastroesophageal reflux disease)    Hematuria    History of cystitis    INTERSTITIAL CYSTITIS--  NO ISSUES FOR YRS   History of gastroesophageal reflux (GERD)    DENIES ISSUES SINCE GASTRIC BY PASS   History of hypertension    NO ISSUE SINCE WT LOSS AFTER GASTRIC BYPASS   History of kidney stones    surgery to remove   History of obstructive sleep apnea    USED CPAP--  NO ISSUE SINCE WT LOSS AFTER GASTRIC BYPASS   Hypertension    Hypothyroidism    Pneumonia    x 1   Pre-diabetes 06/2021   Rheumatoid arthritis (HCC)    Right ureteral stone    Sleep apnea    does not use cpap   Trochanteric bursitis of both hips    Tuberculosis    patient denies this dx as of 09/09/22     Allergies  Allergen Reactions   Other Other (See Comments)    TAPE ADHESIVE per patient SKIN BLISTERS   Codeine  Rash    Tolerates hydrocodone     Methotrexate Rash    Family History  Problem Relation Age of Onset   Colon polyps Mother    Hypertension Mother    Stroke Mother    Hypertension Brother    Heart disease Maternal Uncle    Diabetes Maternal Grandmother    Cancer Maternal Grandmother        THROAT CA   Cancer Maternal Grandfather        THROAT CA   Esophageal cancer Paternal Grandmother    Colon cancer Neg Hx    Rectal cancer Neg Hx    Stomach cancer Neg Hx    Social History   Social History Narrative   Not on file  Husband recently passed in February. Daughter is pregnant with her first child.    Review of Systems  Skin:  Positive for itching (upper body, since Monday after using a different sunscreen).     Objective:  Vitals: BP 120/80   Pulse 81   Ht 5' 3 (1.6 m)   Wt 173 lb (78.5 kg)  LMP 05/25/2014   SpO2 97%   BMI 30.65 kg/m   Physical Exam Vitals and nursing note reviewed.  Constitutional:      General: She is not in acute distress.    Appearance: Normal appearance. She is not toxic-appearing.  HENT:     Head: Normocephalic and atraumatic.   Cardiovascular:     Rate and Rhythm: Normal rate and regular rhythm. No extrasystoles are present.    Pulses: Normal pulses.     Heart sounds: Normal heart sounds. No murmur heard.    No friction rub. No gallop.  Pulmonary:     Effort: Pulmonary effort is normal. No respiratory distress.     Breath sounds: Normal breath sounds. No wheezing or rales.   Skin:    General: Skin is warm and dry.     Comments: No rash to visualize   Neurological:     Mental Status: She is alert and oriented to person, place, and time. Mental status is at baseline.   Psychiatric:        Mood and Affect: Mood normal.        Behavior: Behavior normal.        Thought Content: Thought content normal.         Judgment: Judgment normal.     Results:  Studies Obtained And Personally Reviewed By Me: Labs:     Component Value Date/Time   NA 138 09/25/2022 0803   K 3.9 09/25/2022 0803   CL 106 09/25/2022 0803   CO2 19 (L) 09/25/2022 0803   GLUCOSE 103 (H) 09/25/2022 0803   BUN 11 09/25/2022 0803   CREATININE 0.96 09/25/2022 0803   CREATININE 0.98 05/15/2022 1043   CALCIUM  8.6 (L) 09/25/2022 0803   PROT 6.8 11/24/2022 0947   ALBUMIN 3.7 01/27/2019 0855   AST 37 (H) 11/24/2022 0947   ALT 39 (H) 11/24/2022 0947   ALKPHOS 118 01/27/2019 0855   BILITOT 0.9 11/24/2022 0947   GFRNONAA >60 09/25/2022 0803   GFRNONAA 82 05/10/2020 1005   GFRAA 95 05/10/2020 1005    Lab Results  Component Value Date   WBC 3.5 (L) 09/25/2022   HGB 13.0 09/25/2022   HCT 41.4 09/25/2022   MCV 92.0 09/25/2022   PLT 163 09/25/2022   Lab Results  Component Value Date   CHOL 166 11/24/2022   HDL 88 11/24/2022   LDLCALC 61 11/24/2022   TRIG 83 11/24/2022   CHOLHDL 1.9 11/24/2022   Lab Results  Component Value Date   HGBA1C 5.0 11/24/2022    Lab Results  Component Value Date   TSH 1.36 07/01/2023    Assessment & Plan:   Meds ordered this encounter  Medications   methylPREDNISolone  (MEDROL ) 4 MG tablet    Sig: Medrol  dose pack. Take as instructed    Dispense:  21 tablet    Refill:  0   hydrOXYzine  (VISTARIL ) 25 MG capsule    Sig: Take 1 capsule (25 mg total) by mouth every 8 (eight) hours as needed.    Dispense:  30 capsule    Refill:  0   Orders Placed This Encounter  Procedures   Microalbumin / creatinine urine ratio   Pruritus, Etiology Unclear: on [place], used a different sunscreen than usual, and since then her entire upper body has been itching - has been using Benadryl  for relief. Sending in 4 mg Medrol  tapering course 6-5-4-3-2-1 and  take Atarax  25 mg to take every 8 hours as  needed.  Bereavement due to her husband's passing in February, still grieving, and is  managed with Wellbutrin -XL 300 mg daily and Zoloft  100 mg daily - also has Valium  5 mg as needed for sleep. She does say that she is looking forward to new grandchild and has plenty of support.Spent time discussing her situation. Seems to be doing as well as possible. Resides alone. Family supportive.    I,Emily Lagle,acting as a Neurosurgeon for Sylvan Evener, MD.,have documented all relevant documentation on the behalf of Sylvan Evener, MD,as directed by  Sylvan Evener, MD while in the presence of Sylvan Evener, MD.   I, Sylvan Evener, MD, have reviewed all documentation for this visit. The documentation on 08/08/23 for the exam, diagnosis, procedures, and orders are all accurate and complete.

## 2023-08-06 LAB — MICROALBUMIN / CREATININE URINE RATIO
Creatinine, Urine: 123 mg/dL (ref 20–275)
Microalb Creat Ratio: 7 mg/g{creat} (ref <30)
Microalb, Ur: 0.9 mg/dL

## 2023-08-07 ENCOUNTER — Other Ambulatory Visit: Payer: Self-pay | Admitting: Internal Medicine

## 2023-08-09 ENCOUNTER — Other Ambulatory Visit (HOSPITAL_COMMUNITY): Payer: Self-pay

## 2023-08-09 MED ORDER — HYDROCODONE-ACETAMINOPHEN 5-325 MG PO TABS
1.0000 | ORAL_TABLET | Freq: Two times a day (BID) | ORAL | 0 refills | Status: DC
  Filled 2023-08-09: qty 45, 30d supply, fill #0

## 2023-08-17 ENCOUNTER — Other Ambulatory Visit (HOSPITAL_BASED_OUTPATIENT_CLINIC_OR_DEPARTMENT_OTHER): Payer: Self-pay

## 2023-08-17 ENCOUNTER — Other Ambulatory Visit (HOSPITAL_COMMUNITY): Payer: Self-pay

## 2023-09-01 ENCOUNTER — Other Ambulatory Visit: Payer: Self-pay | Admitting: Internal Medicine

## 2023-09-06 ENCOUNTER — Telehealth: Payer: Self-pay | Admitting: Cardiology

## 2023-09-06 NOTE — Telephone Encounter (Signed)
 Pt called in to ask if her Ozempic  medication has been delivered to the office yet. Please advise.

## 2023-09-07 ENCOUNTER — Telehealth: Payer: Self-pay | Admitting: Pharmacy Technician

## 2023-09-07 NOTE — Telephone Encounter (Signed)
 The patients ozepmic was delivered to the old [facility] address and returned to the company. The representative is sending out a refill request form via fax to us  to resubmit the request again with the updated [facility] address. Will upload to media once it comes through

## 2023-09-07 NOTE — Telephone Encounter (Signed)
 Pt made aware that we faxed a new refill request today. She has one more injection left. Due on Sunday. Should get shipment prior to her running out. She asked that we call and not send mychart when it comes in.

## 2023-09-08 ENCOUNTER — Other Ambulatory Visit (HOSPITAL_COMMUNITY): Payer: Self-pay

## 2023-09-08 DIAGNOSIS — M069 Rheumatoid arthritis, unspecified: Secondary | ICD-10-CM | POA: Diagnosis not present

## 2023-09-08 DIAGNOSIS — E6609 Other obesity due to excess calories: Secondary | ICD-10-CM | POA: Diagnosis not present

## 2023-09-08 DIAGNOSIS — M549 Dorsalgia, unspecified: Secondary | ICD-10-CM | POA: Diagnosis not present

## 2023-09-08 DIAGNOSIS — Z9884 Bariatric surgery status: Secondary | ICD-10-CM | POA: Diagnosis not present

## 2023-09-08 DIAGNOSIS — Z789 Other specified health status: Secondary | ICD-10-CM | POA: Diagnosis not present

## 2023-09-08 DIAGNOSIS — Z6830 Body mass index (BMI) 30.0-30.9, adult: Secondary | ICD-10-CM | POA: Diagnosis not present

## 2023-09-08 DIAGNOSIS — M179 Osteoarthritis of knee, unspecified: Secondary | ICD-10-CM | POA: Diagnosis not present

## 2023-09-08 DIAGNOSIS — R7401 Elevation of levels of liver transaminase levels: Secondary | ICD-10-CM | POA: Diagnosis not present

## 2023-09-08 DIAGNOSIS — Z79899 Other long term (current) drug therapy: Secondary | ICD-10-CM | POA: Diagnosis not present

## 2023-09-08 DIAGNOSIS — Z1239 Encounter for other screening for malignant neoplasm of breast: Secondary | ICD-10-CM | POA: Diagnosis not present

## 2023-09-08 MED ORDER — HYDROCODONE-ACETAMINOPHEN 5-325 MG PO TABS
1.0000 | ORAL_TABLET | Freq: Two times a day (BID) | ORAL | 0 refills | Status: DC
  Filled 2023-09-08: qty 45, 30d supply, fill #0

## 2023-09-11 ENCOUNTER — Other Ambulatory Visit (HOSPITAL_COMMUNITY): Payer: Self-pay

## 2023-09-13 NOTE — Progress Notes (Signed)
 Patient Care Team: Perri Ronal PARAS, MD as PCP - General (Internal Medicine) Himmelrich, Camie RAMAN, RD (Inactive) as Dietitian Tobb, Kardie, DO as Consulting Physician (Cardiology)  Visit Date: 09/14/23  Subjective:   Chief Complaint  Patient presents with   Anxiety   Chronic pain due to arthritis   Patient PI:Mary Herman,Female DOB:1961/01/11,62 y.o. FMW:[id number]   62 y.o.Female presents today for 1 months follow-up for Bereavement. Patient has a past medical history of Depression/Anxiety. Seen on 6/12 where her husband's passing in February of this year was discussed. She takes Wellbutrin -XL 300 mg daily, Celexa  20 mg daily, Zoloft  100 mg daily, Valium  5 mg at bedtime as needed, and was started on Vistaril  25 mg for pruritus, though it is being noted regardless for its anti-anxiety properties. During that visit, Mary Herman expressed that overall she has been managing well with her family's support, and is even looking forward to her 1st grandchild. She says that she has not been doing well lately, recently became heated with her pain clinic physician. Mentions that she has seen a therapist intermittently over the years, but is not sure if they're in-network for her or still practicing, and she would be interested in seeing somebody.   Musculoskeletal Pain contributed by Arthritis in knees and hands managed with Hydrocone as needed prescribed by a pain clinic, however she would like to switch to someone else as she has become frustrated with her current physician.   Discussed Vaccines, as she is expecting her first grandchild at the end of the year and would like to be UTD on recommended vaccinations. She is UTD on: Tdap (2024), PNA (2023), and Influenza (11/24/2022).  Past Medical History:  Diagnosis Date   Anemia    Chronic back pain    Depression    Diabetes mellitus without complication (HCC)    takes on sundays - semaglutide    Dyspnea    with activity   Environmental allergies     Family history of adverse reaction to anesthesia    daughter- n/v       GERD (gastroesophageal reflux disease)    Hematuria    History of cystitis    INTERSTITIAL CYSTITIS--  NO ISSUES FOR YRS   History of gastroesophageal reflux (GERD)    DENIES ISSUES SINCE GASTRIC BY PASS   History of hypertension    NO ISSUE SINCE WT LOSS AFTER GASTRIC BYPASS   History of kidney stones    surgery to remove   History of obstructive sleep apnea    USED CPAP--  NO ISSUE SINCE WT LOSS AFTER GASTRIC BYPASS   Hypertension    Hypothyroidism    Pneumonia    x 1   Pre-diabetes 06/2021   Rheumatoid arthritis (HCC)    Right ureteral stone    Sleep apnea    does not use cpap   Trochanteric bursitis of both hips    Tuberculosis    patient denies this dx as of 09/09/22    Allergies  Allergen Reactions   Other Other (See Comments)    TAPE ADHESIVE per patient SKIN BLISTERS   Codeine  Rash    Tolerates hydrocodone     Methotrexate Rash    Family History  Problem Relation Age of Onset   Colon polyps Mother    Hypertension Mother    Stroke Mother    Hypertension Brother    Heart disease Maternal Uncle    Diabetes Maternal Grandmother    Cancer Maternal Grandmother  THROAT CA   Cancer Maternal Grandfather        THROAT CA   Esophageal cancer Paternal Grandmother    Colon cancer Neg Hx    Rectal cancer Neg Hx    Stomach cancer Neg Hx    Social History   Social History Narrative   Not on file   Review of Systems  Musculoskeletal:  Positive for joint pain.  Psychiatric/Behavioral:         (+) Bereavement/Situational Stress  All other systems reviewed and are negative.    Objective:  Vitals: BP 110/80   Pulse 76   Ht 5' 3 (1.6 m)   Wt 168 lb (76.2 kg)   LMP 05/25/2014   SpO2 96%   BMI 29.76 kg/m   Physical Exam Vitals and nursing note reviewed.  Constitutional:      General: She is not in acute distress.    Appearance: Normal appearance. She is not toxic-appearing.   HENT:     Head: Normocephalic and atraumatic.  Pulmonary:     Effort: Pulmonary effort is normal.  Skin:    General: Skin is warm and dry.  Neurological:     Mental Status: She is alert and oriented to person, place, and time. Mental status is at baseline.  Psychiatric:        Mood and Affect: Mood normal.        Behavior: Behavior normal.        Thought Content: Thought content normal.        Judgment: Judgment normal.     Results:  Studies Obtained And Personally Reviewed By Me:     09/14/2023   11:25 AM 05/21/2023   11:29 AM  GAD 7 : Generalized Anxiety Score  Nervous, Anxious, on Edge 0 2  Control/stop worrying 1 0  Worry too much - different things 1 0  Trouble relaxing 0 2  Restless 0 1  Easily annoyed or irritable 1 1  Afraid - awful might happen 0 2  Total GAD 7 Score 3 8  Anxiety Difficulty Somewhat difficult       09/14/2023   11:24 AM 05/21/2023   11:28 AM  Depression screen PHQ 2/9  Decreased Interest 0 2  Down, Depressed, Hopeless 2 1  PHQ - 2 Score 2 3  Altered sleeping 1 1  Tired, decreased energy 2 2  Change in appetite 2 2  Feeling bad or failure about yourself  0 2  Trouble concentrating 1 2  Moving slowly or fidgety/restless 0 0  Suicidal thoughts 0 0  PHQ-9 Score 8 12  Difficult doing work/chores Not difficult at all    Labs:     Component Value Date/Time   NA 138 09/25/2022 0803   K 3.9 09/25/2022 0803   CL 106 09/25/2022 0803   CO2 19 (L) 09/25/2022 0803   GLUCOSE 103 (H) 09/25/2022 0803   BUN 11 09/25/2022 0803   CREATININE 0.96 09/25/2022 0803   CREATININE 0.98 05/15/2022 1043   CALCIUM  8.6 (L) 09/25/2022 0803   PROT 6.8 11/24/2022 0947   ALBUMIN 3.7 01/27/2019 0855   AST 37 (H) 11/24/2022 0947   ALT 39 (H) 11/24/2022 0947   ALKPHOS 118 01/27/2019 0855   BILITOT 0.9 11/24/2022 0947   GFRNONAA >60 09/25/2022 0803   GFRNONAA 82 05/10/2020 1005   GFRAA 95 05/10/2020 1005    Lab Results  Component Value Date   WBC 3.5 (L)  09/25/2022   HGB 13.0 09/25/2022   HCT  41.4 09/25/2022   MCV 92.0 09/25/2022   PLT 163 09/25/2022   Lab Results  Component Value Date   CHOL 166 11/24/2022   HDL 88 11/24/2022   LDLCALC 61 11/24/2022   TRIG 83 11/24/2022   CHOLHDL 1.9 11/24/2022   Lab Results  Component Value Date   HGBA1C 5.0 11/24/2022    Lab Results  Component Value Date   TSH 1.36 07/01/2023   Assessment & Plan:   Orders Placed This Encounter  Procedures   Basic Metabolic Panel   Ambulatory referral to Psychology   Ambulatory referral to Physical Therapy   Ambulatory referral to Physical Medicine Rehab   Bereavement; Depression/Anxiety: seen on 6/12 where her husband's passing in February of this year was discussed. She takes Wellbutrin -XL 300 mg daily, Celexa  20 mg daily, Zoloft  100 mg daily, Valium  5 mg at bedtime as needed, and was started on Vistaril  25 mg for pruritus, though it is being noted regardless for its anti-anxiety properties. Today, she says that she has not been doing well lately, recently became heated with her pain clinic physician. She has seen a therapist intermittently over the years, but is not sure if they're in-network for her or still practicing, and she would be interested in seeing somebody now. Referring to Dr. Ronal Jenkins Sprung, Psychologist - ADDENDUM: Dr. Sprung is not accepting new patients, requested placement with female physician accepting new patients.    Rheumatoid arthritis followed by [facility]- has chronic Musculoskeletal Pain contributed by Arthritis in knees and hands managed with Hydrocodone  as needed prescribed by  [facility] pain clinic, however she would like to switch to someone else as she has become frustrated with her current physician. Referring to Dr. Murray Collier, [facility].   Discussed Vaccines, as she is expecting her first grandchild at the end of the year and would like to be UTD on recommended  vaccinations. She is UTD on: Tdap (2024), PNA (2023), and Influenza (11/24/2022).     I,Emily Lagle,acting as a Neurosurgeon for Ronal JINNY Hailstone, MD.,have documented all relevant documentation on the behalf of Ronal JINNY Hailstone, MD,as directed by  Ronal JINNY Hailstone, MD while in the presence of Ronal JINNY Hailstone, MD.   I, Ronal JINNY Hailstone, MD, have reviewed all documentation for this visit. The documentation on 09/14/23 for the exam, diagnosis, procedures, and orders are all accurate and complete.

## 2023-09-14 ENCOUNTER — Other Ambulatory Visit: Payer: Self-pay | Admitting: Internal Medicine

## 2023-09-14 ENCOUNTER — Ambulatory Visit: Payer: Self-pay | Admitting: Internal Medicine

## 2023-09-14 ENCOUNTER — Encounter: Payer: Self-pay | Admitting: Internal Medicine

## 2023-09-14 VITALS — BP 110/80 | HR 76 | Ht 63.0 in | Wt 168.0 lb

## 2023-09-14 DIAGNOSIS — M059 Rheumatoid arthritis with rheumatoid factor, unspecified: Secondary | ICD-10-CM

## 2023-09-14 DIAGNOSIS — F419 Anxiety disorder, unspecified: Secondary | ICD-10-CM

## 2023-09-14 DIAGNOSIS — G8929 Other chronic pain: Secondary | ICD-10-CM | POA: Diagnosis not present

## 2023-09-14 DIAGNOSIS — F32A Depression, unspecified: Secondary | ICD-10-CM | POA: Diagnosis not present

## 2023-09-14 DIAGNOSIS — E1165 Type 2 diabetes mellitus with hyperglycemia: Secondary | ICD-10-CM

## 2023-09-14 DIAGNOSIS — Z634 Disappearance and death of family member: Secondary | ICD-10-CM | POA: Diagnosis not present

## 2023-09-14 LAB — BASIC METABOLIC PANEL WITH GFR
BUN/Creatinine Ratio: 16 (calc) (ref 6–22)
BUN: 18 mg/dL (ref 7–25)
CO2: 28 mmol/L (ref 20–32)
Calcium: 9.4 mg/dL (ref 8.6–10.4)
Chloride: 107 mmol/L (ref 98–110)
Creat: 1.15 mg/dL — ABNORMAL HIGH (ref 0.50–1.05)
Glucose, Bld: 95 mg/dL (ref 65–99)
Potassium: 5.8 mmol/L — ABNORMAL HIGH (ref 3.5–5.3)
Sodium: 140 mmol/L (ref 135–146)
eGFR: 55 mL/min/1.73m2 — ABNORMAL LOW (ref >=60)

## 2023-09-14 NOTE — Patient Instructions (Addendum)
 Patient has been going to [facility] for chronic pain management and is currently finding that to be expensive. She has Rheumatoid Arthritis followed by [facility]. Sending her to [facility] Physical medicine for pain management consultation.

## 2023-09-15 ENCOUNTER — Ambulatory Visit: Payer: Self-pay | Admitting: Internal Medicine

## 2023-09-17 ENCOUNTER — Encounter: Payer: Self-pay | Admitting: Physical Medicine and Rehabilitation

## 2023-09-20 ENCOUNTER — Telehealth: Payer: Self-pay | Admitting: Pharmacy Technician

## 2023-09-20 NOTE — Telephone Encounter (Signed)
 Pt calling to f/u on when Ozempic  will be in. Pt states she is completely out and had to use husband's yesterday, being that injection was due. Please advise

## 2023-09-20 NOTE — Telephone Encounter (Signed)
 Called [facility] to check status of ozempic  shipment;  Has been shipped out with tracking number : [id number]

## 2023-09-22 DIAGNOSIS — M0579 Rheumatoid arthritis with rheumatoid factor of multiple sites without organ or systems involvement: Secondary | ICD-10-CM | POA: Diagnosis not present

## 2023-09-28 ENCOUNTER — Other Ambulatory Visit: Payer: Self-pay | Admitting: Internal Medicine

## 2023-09-28 ENCOUNTER — Telehealth: Payer: Self-pay | Admitting: Pharmacy Technician

## 2023-09-28 LAB — HM DIABETES EYE EXAM

## 2023-09-28 NOTE — Telephone Encounter (Signed)
 Called [facility] to check the status of ozempic  shipment- rep said she will have to put in a new request for it to go to [phone].   She can see where we called and it was switched over to the new address but not sure why it did not go out for delivery.   rep stated they had what they needed and it will be up to 10-14 business days from today. Previous rep from June did not process it correctly and it was on their end and not ours.   As of April 16th- no vouchers are given out anymore   Patient will need a refill request sent in before 12/18/23

## 2023-09-28 NOTE — Telephone Encounter (Signed)
 Patient updated.

## 2023-09-29 ENCOUNTER — Ambulatory Visit (INDEPENDENT_AMBULATORY_CARE_PROVIDER_SITE_OTHER): Payer: Self-pay | Admitting: Orthopaedic Surgery

## 2023-09-29 DIAGNOSIS — M25561 Pain in right knee: Secondary | ICD-10-CM

## 2023-09-29 DIAGNOSIS — M1712 Unilateral primary osteoarthritis, left knee: Secondary | ICD-10-CM | POA: Diagnosis not present

## 2023-09-29 DIAGNOSIS — M25562 Pain in left knee: Secondary | ICD-10-CM

## 2023-09-29 DIAGNOSIS — M1711 Unilateral primary osteoarthritis, right knee: Secondary | ICD-10-CM | POA: Diagnosis not present

## 2023-09-29 DIAGNOSIS — G8929 Other chronic pain: Secondary | ICD-10-CM

## 2023-09-29 MED ORDER — LIDOCAINE HCL 1 % IJ SOLN
3.0000 mL | INTRAMUSCULAR | Status: AC | PRN
  Administered 2023-09-29: 3 mL

## 2023-09-29 MED ORDER — METHYLPREDNISOLONE ACETATE 40 MG/ML IJ SUSP
40.0000 mg | INTRAMUSCULAR | Status: AC | PRN
Start: 2023-09-29 — End: 2023-09-29
  Administered 2023-09-29: 40 mg via INTRA_ARTICULAR

## 2023-09-29 MED ORDER — METHYLPREDNISOLONE ACETATE 40 MG/ML IJ SUSP
40.0000 mg | INTRAMUSCULAR | Status: AC | PRN
  Administered 2023-09-29: 40 mg via INTRA_ARTICULAR

## 2023-09-29 NOTE — Progress Notes (Signed)
 The patient is very well-known to us .  She has severe end-stage arthritis with varus malalignment of both knees.  She is 63 years old.  She has loss of the significant amount of weight on GLP medications and that has helped her quite a bit.  She is requesting steroid injections in her knees today.  At some point she is thinking about knee replacement surgery.  Her daughter will be having a baby soon so she wants to still hold off on any type of surgery.  She has not had a blood glucose checked in a long period of time.  She has lost a very significant amount of weight.  On exam both knees have varus malalignment with significant pain globally and patellofemoral crepitation.  Per her request I did place a steroid injection in both knees that she tolerated very well.  The next time she does come in for injections, it would be worth checking a hemoglobin A1c if she has not had 1 of those done.    Procedure Note  Patient: Mary Herman             Date of Birth: [birth year]           MRN: [id number]             Visit Date: 09/29/2023  Procedures: Visit Diagnoses:  1. Chronic pain of right knee   2. Unilateral primary osteoarthritis, left knee     Large Joint Inj: R knee on 09/29/2023 9:52 AM Indications: diagnostic evaluation and pain Details: 22 G 1.5 in needle, superolateral approach  Arthrogram: No  Medications: 3 mL lidocaine  1 %; 40 mg methylPREDNISolone  acetate 40 MG/ML Outcome: tolerated well, no immediate complications Procedure, treatment alternatives, risks and benefits explained, specific risks discussed. Consent was given by the patient. Immediately prior to procedure a time out was called to verify the correct patient, procedure, equipment, support staff and site/side marked as required. Patient was prepped and draped in the usual sterile fashion.    Large Joint Inj: L knee on 09/29/2023 9:52 AM Indications: diagnostic evaluation and pain Details: 22 G 1.5 in needle,  superolateral approach  Arthrogram: No  Medications: 3 mL lidocaine  1 %; 40 mg methylPREDNISolone  acetate 40 MG/ML Outcome: tolerated well, no immediate complications Procedure, treatment alternatives, risks and benefits explained, specific risks discussed. Consent was given by the patient. Immediately prior to procedure a time out was called to verify the correct patient, procedure, equipment, support staff and site/side marked as required. Patient was prepped and draped in the usual sterile fashion.

## 2023-09-30 ENCOUNTER — Encounter: Payer: Self-pay | Admitting: Internal Medicine

## 2023-10-06 DIAGNOSIS — Z1231 Encounter for screening mammogram for malignant neoplasm of breast: Secondary | ICD-10-CM | POA: Diagnosis not present

## 2023-10-06 DIAGNOSIS — M8589 Other specified disorders of bone density and structure, multiple sites: Secondary | ICD-10-CM | POA: Diagnosis not present

## 2023-10-06 LAB — HM DEXA SCAN

## 2023-10-06 LAB — HM MAMMOGRAPHY

## 2023-10-07 ENCOUNTER — Other Ambulatory Visit (HOSPITAL_COMMUNITY): Payer: Self-pay

## 2023-10-07 DIAGNOSIS — R7401 Elevation of levels of liver transaminase levels: Secondary | ICD-10-CM | POA: Diagnosis not present

## 2023-10-07 DIAGNOSIS — Z1239 Encounter for other screening for malignant neoplasm of breast: Secondary | ICD-10-CM | POA: Diagnosis not present

## 2023-10-07 DIAGNOSIS — M069 Rheumatoid arthritis, unspecified: Secondary | ICD-10-CM | POA: Diagnosis not present

## 2023-10-07 DIAGNOSIS — M179 Osteoarthritis of knee, unspecified: Secondary | ICD-10-CM | POA: Diagnosis not present

## 2023-10-07 DIAGNOSIS — Z789 Other specified health status: Secondary | ICD-10-CM | POA: Diagnosis not present

## 2023-10-07 DIAGNOSIS — M549 Dorsalgia, unspecified: Secondary | ICD-10-CM | POA: Diagnosis not present

## 2023-10-07 DIAGNOSIS — Z79899 Other long term (current) drug therapy: Secondary | ICD-10-CM | POA: Diagnosis not present

## 2023-10-07 DIAGNOSIS — Z6829 Body mass index (BMI) 29.0-29.9, adult: Secondary | ICD-10-CM | POA: Diagnosis not present

## 2023-10-07 DIAGNOSIS — Z9884 Bariatric surgery status: Secondary | ICD-10-CM | POA: Diagnosis not present

## 2023-10-07 MED ORDER — HYDROCODONE-ACETAMINOPHEN 5-325 MG PO TABS
ORAL_TABLET | ORAL | 0 refills | Status: DC
  Filled 2023-10-07: qty 45, 30d supply, fill #0
  Filled 2023-10-11: qty 45, 12d supply, fill #0

## 2023-10-08 ENCOUNTER — Encounter: Payer: Self-pay | Admitting: Internal Medicine

## 2023-10-11 ENCOUNTER — Other Ambulatory Visit (HOSPITAL_COMMUNITY): Payer: Self-pay

## 2023-10-13 ENCOUNTER — Ambulatory Visit (INDEPENDENT_AMBULATORY_CARE_PROVIDER_SITE_OTHER): Admitting: Licensed Clinical Social Worker

## 2023-10-13 ENCOUNTER — Encounter: Payer: Self-pay | Admitting: Licensed Clinical Social Worker

## 2023-10-13 DIAGNOSIS — F331 Major depressive disorder, recurrent, moderate: Secondary | ICD-10-CM | POA: Diagnosis not present

## 2023-10-13 DIAGNOSIS — F411 Generalized anxiety disorder: Secondary | ICD-10-CM | POA: Diagnosis not present
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
## 2023-10-14 ENCOUNTER — Other Ambulatory Visit: Payer: Self-pay | Admitting: Internal Medicine

## 2023-10-14 DIAGNOSIS — E559 Vitamin D deficiency, unspecified: Secondary | ICD-10-CM

## 2023-10-18 NOTE — Telephone Encounter (Signed)
 Ozempic  arrived, patient notified to pick up on 5th floor

## 2023-10-19 ENCOUNTER — Telehealth: Payer: Self-pay | Admitting: Pharmacy Technician

## 2023-10-19 NOTE — Telephone Encounter (Signed)
 Form printed and placed in Dr. Ginette mailbox for completion.

## 2023-10-19 NOTE — Telephone Encounter (Signed)
 Received refill notification from [facility] for ozempic : Attached in media

## 2023-10-20 ENCOUNTER — Ambulatory Visit: Payer: Self-pay | Admitting: Internal Medicine

## 2023-11-01 ENCOUNTER — Other Ambulatory Visit: Payer: Self-pay | Admitting: Internal Medicine

## 2023-11-01 DIAGNOSIS — R8290 Unspecified abnormal findings in urine: Secondary | ICD-10-CM

## 2023-11-02 ENCOUNTER — Ambulatory Visit: Admitting: Licensed Clinical Social Worker

## 2023-11-05 ENCOUNTER — Other Ambulatory Visit: Payer: Self-pay | Admitting: Internal Medicine

## 2023-11-12 ENCOUNTER — Other Ambulatory Visit (HOSPITAL_COMMUNITY): Payer: Self-pay

## 2023-11-12 ENCOUNTER — Other Ambulatory Visit: Payer: Self-pay | Admitting: Internal Medicine

## 2023-11-12 DIAGNOSIS — Z9884 Bariatric surgery status: Secondary | ICD-10-CM | POA: Diagnosis not present

## 2023-11-12 DIAGNOSIS — R7401 Elevation of levels of liver transaminase levels: Secondary | ICD-10-CM | POA: Diagnosis not present

## 2023-11-12 DIAGNOSIS — Z6829 Body mass index (BMI) 29.0-29.9, adult: Secondary | ICD-10-CM | POA: Diagnosis not present

## 2023-11-12 DIAGNOSIS — N1831 Chronic kidney disease, stage 3a: Secondary | ICD-10-CM | POA: Diagnosis not present

## 2023-11-12 DIAGNOSIS — M549 Dorsalgia, unspecified: Secondary | ICD-10-CM | POA: Diagnosis not present

## 2023-11-12 DIAGNOSIS — M069 Rheumatoid arthritis, unspecified: Secondary | ICD-10-CM | POA: Diagnosis not present

## 2023-11-12 DIAGNOSIS — M179 Osteoarthritis of knee, unspecified: Secondary | ICD-10-CM | POA: Diagnosis not present

## 2023-11-12 DIAGNOSIS — Z789 Other specified health status: Secondary | ICD-10-CM | POA: Diagnosis not present

## 2023-11-12 DIAGNOSIS — J040 Acute laryngitis: Secondary | ICD-10-CM | POA: Diagnosis not present

## 2023-11-12 DIAGNOSIS — Z79899 Other long term (current) drug therapy: Secondary | ICD-10-CM | POA: Diagnosis not present

## 2023-11-12 MED ORDER — HYDROCODONE-ACETAMINOPHEN 5-325 MG PO TABS
ORAL_TABLET | ORAL | 0 refills | Status: DC
  Filled 2023-11-12: qty 45, 30d supply, fill #0

## 2023-11-13 DIAGNOSIS — R059 Cough, unspecified: Secondary | ICD-10-CM | POA: Diagnosis not present

## 2023-11-13 DIAGNOSIS — J040 Acute laryngitis: Secondary | ICD-10-CM | POA: Diagnosis not present

## 2023-11-13 DIAGNOSIS — R0981 Nasal congestion: Secondary | ICD-10-CM | POA: Diagnosis not present

## 2023-11-16 ENCOUNTER — Ambulatory Visit: Admitting: Licensed Clinical Social Worker

## 2023-11-17 ENCOUNTER — Encounter: Payer: Self-pay | Admitting: *Deleted

## 2023-11-19 ENCOUNTER — Ambulatory Visit: Payer: Self-pay | Attending: Cardiology | Admitting: Cardiology

## 2023-11-19 ENCOUNTER — Encounter: Payer: Self-pay | Admitting: Cardiology

## 2023-11-19 VITALS — BP 110/88 | HR 72 | Ht 63.0 in | Wt 160.0 lb

## 2023-11-19 DIAGNOSIS — I2510 Atherosclerotic heart disease of native coronary artery without angina pectoris: Secondary | ICD-10-CM | POA: Diagnosis not present

## 2023-11-19 DIAGNOSIS — I10 Essential (primary) hypertension: Secondary | ICD-10-CM | POA: Diagnosis not present

## 2023-11-19 DIAGNOSIS — E782 Mixed hyperlipidemia: Secondary | ICD-10-CM | POA: Insufficient documentation

## 2023-11-19 NOTE — Patient Instructions (Signed)
 Medication Instructions:  Your physician recommends that you continue on your current medications as directed. Please refer to the Current Medication list given to you today.  *If you need a refill on your cardiac medications before your next appointment, please call your pharmacy*   Follow-Up: At [facility], you and your health needs are our priority.  As part of our continuing mission to provide you with exceptional heart care, our providers are all part of one team.  This team includes your primary Cardiologist (physician) and Advanced Practice Providers or APPs (Physician Assistants and Nurse Practitioners) who all work together to provide you with the care you need, when you need it.  Your next appointment:   2 year(s)  Provider:   Kardie Tobb, DO

## 2023-11-22 NOTE — Progress Notes (Signed)
 Cardiology Office Note:    Date:  11/22/2023   ID:  Mary Herman, DOB 07-26-1960, MRN [id number]  PCP:  Perri Ronal PARAS, MD  Cardiologist:  Dub Huntsman, DO  Electrophysiologist:  None   Referring MD: Perri Ronal PARAS, MD   I am doing fine  History of Present Illness:    Mary Herman is a 63 y.o. female with a hx of coronary calcification seen on recent coronary calcium  score, hypertension and hyperlipidemia who presents for cardiovascular follow-up. She is transitioning from Dr. Ronal Ross, who previously managed her care.  Discussed the use of AI scribe software for clinical note transcription with the patient, who gave verbal consent to proceed  She has a history of an abnormal EKG, with a normal PET scan and an echocardiogram showing no significant findings last year. She is asymptomatic for heart-related issues, including chest pain and shortness of breath.   Past Medical History:  Diagnosis Date   Anemia    Chronic back pain    Depression    Diabetes mellitus without complication (HCC)    takes on sundays - semaglutide    Dyspnea    with activity   Environmental allergies    Family history of adverse reaction to anesthesia    daughter- n/v       GERD (gastroesophageal reflux disease)    Hematuria    History of cystitis    INTERSTITIAL CYSTITIS--  NO ISSUES FOR YRS   History of gastroesophageal reflux (GERD)    DENIES ISSUES SINCE GASTRIC BY PASS   History of hypertension    NO ISSUE SINCE WT LOSS AFTER GASTRIC BYPASS   History of kidney stones    surgery to remove   History of obstructive sleep apnea    USED CPAP--  NO ISSUE SINCE WT LOSS AFTER GASTRIC BYPASS   Hypertension    Hypothyroidism    Pneumonia    x 1   Pre-diabetes 06/2021   Rheumatoid arthritis (HCC)    Right ureteral stone    Sleep apnea    does not use cpap   Trochanteric bursitis of both hips    Tuberculosis    patient denies this dx as of 09/09/22    Past Surgical History:   Procedure Laterality Date   CARDIAC CATHETERIZATION  12-07-2006  [facility]   NORMAL CORONARIES ARTERIES/ NORMAL LVF/ MILD INCREASED END-DIASTOLIC PRESSURE   CESAREAN SECTION  1998   CHOLECYSTECTOMY  1980'S   CYSTOSCOPY WITH RETROGRADE PYELOGRAM, URETEROSCOPY AND STENT PLACEMENT Bilateral 12/26/2012   Procedure: cystoscopy with bilateral retrogrades, bilateral ureteroscopy ;  Surgeon: Alm GORMAN Fragmin, MD;  Location: [facility];  Service: Urology;  Laterality: Bilateral;   CYSTOSCOPY WITH STENT PLACEMENT Bilateral 12/26/2012   Procedure: CYSTOSCOPY WITH STENT PLACEMENT;  Surgeon: Alm GORMAN Fragmin, MD;  Location: [facility];  Service: Urology;  Laterality: Bilateral;   CYSTOSCOPY/ HYDRODISTENTION  YRS AGO   DILATATION & CURETTAGE/HYSTEROSCOPY WITH MYOSURE N/A 11/25/2018   Procedure: DILATATION & CURETTAGE/HYSTEROSCOPY WITH MYOSURE;  Surgeon: Rockney Evalene SQUIBB, MD;  Location: [facility];  Service: Gynecology;  Laterality: N/A;   ETHMOIDECTOMY Right 09/25/2022   Procedure: RIGHT ANTERIOR ETHMOIDECTOMY;  Surgeon: Llewellyn Gerard LABOR, DO;  Location: [facility];  Service: ENT;  Laterality: Right;   HERNIA REPAIR     HOLMIUM LASER APPLICATION Right 12/26/2012   Procedure: HOLMIUM LASER APPLICATION;  Surgeon: Alm GORMAN Fragmin, MD;  Location: [facility];  Service: Urology;  Laterality: Right;  KNEE ARTHROSCOPY Left 1992   MAXILLARY ANTROSTOMY Right 09/25/2022   Procedure: RIGHT MAXILLARY ANTROSTOMY;  Surgeon: Llewellyn Gerard LABOR, DO;  Location: [facility];  Service: ENT;  Laterality: Right;   NASAL TURBINATE REDUCTION Bilateral 09/25/2022   Procedure: BILATERAL INFERIOR TURBINATE REDUCTION/SUBMUCOSAL RESECTION;  Surgeon: Llewellyn Gerard LABOR, DO;  Location: [facility];  Service: ENT;  Laterality: Bilateral;   ROBOTIC ASSISTED TOTAL HYSTERECTOMY WITH BILATERAL SALPINGO OOPHERECTOMY N/A 01/31/2019   Procedure: XI ROBOTIC ASSISTED TOTAL HYSTERECTOMY WITH BILATERAL SALPINGO  OOPHORECTOMY;  Surgeon: Eloy Herring, MD;  Location: [facility];  Service: Gynecology;  Laterality: N/A;   ROUX-EN-Y PROCEDURE  07/25/2007   SHOULDER ARTHROSCOPY Right 2011   SINUS ENDO WITH FUSION Right 09/25/2022   Procedure: FUNCTIONAL ENDOSCOPIC SINUS WITH FUSION;  Surgeon: Llewellyn Gerard A, DO;  Location: [facility];  Service: ENT;  Laterality: Right;   TMJ ARTHROPLASTY  1990'S   BILATERAL UPPER   TOTAL ABDOMINAL HYSTERECTOMY  01/31/2019    Current Medications: Current Meds  Medication Sig   ACCU-CHEK GUIDE test strip USE TO TEST ONCE DAILY   Accu-Chek Softclix Lancets lancets USE ONCE DAILY   adalimumab (HUMIRA, 2 PEN,) 40 MG/0.4ML pen 40 mg every 14 (fourteen) days.   albuterol  (PROAIR  HFA) 108 (90 Base) MCG/ACT inhaler Inhale 2 puffs into the lungs every 6 (six) hours as needed for wheezing or shortness of breath.   buPROPion  (WELLBUTRIN  XL) 300 MG 24 hr tablet TAKE 1 TABLET BY MOUTH EVERY DAY   celecoxib  (CELEBREX ) 200 MG capsule TAKE 1 CAPSULE BY MOUTH TWICE A DAY   citalopram  (CELEXA ) 20 MG tablet TAKE 1 TABLET BY MOUTH EVERY DAY IN THE MORNING   clotrimazole -betamethasone  (LOTRISONE ) cream APPLY TO AFFECTED AREA TWICE A DAY (Patient taking differently: Apply 1 Application topically daily as needed (yeast).)   cyclobenzaprine  (FLEXERIL ) 10 MG tablet TAKE 1 TABLET BY MOUTH EVERYDAY AT BEDTIME   diazepam  (VALIUM ) 5 MG tablet TAKE 1 TABLET BY MOUTH EVERY DAY AT BEDTIME AS NEEDED   folic acid (FOLVITE) 1 MG tablet Take 1 mg by mouth daily.   HYDROcodone -acetaminophen  (NORCO/VICODIN) 5-325 MG tablet Take 1 tablet by mouth every 6 (six) hours as needed for moderate pain.   HYDROcodone -acetaminophen  (NORCO/VICODIN) 5-325 MG tablet Take 0.5-1 tablet by mouth daily as needed   HYDROcodone -acetaminophen  (NORCO/VICODIN) 5-325 MG tablet Take 1/2  to 1 tablet by mouth daily only when needed for pain refractory to other interventions. (Using only 25 tablets monthly.)   HYDROcodone -acetaminophen   (NORCO/VICODIN) 5-325 MG tablet Take 0.5-1 tablet by mouth 2 (two) times daily only when needed for pain refractory to other interventions.   HYDROcodone -acetaminophen  (NORCO/VICODIN) 5-325 MG tablet Take 0.5 to 1 tablet by mouth up to twice daily only when needed for pain refractory to other interventions.   HYDROcodone -acetaminophen  (NORCO/VICODIN) 5-325 MG tablet Take 0.5-1 tablets by mouth up to 2 (two) times daily only when needed for pain refractory to other interventions.   HYDROcodone -acetaminophen  (NORCO/VICODIN) 5-325 MG tablet Take 1 to 2 tablets by mouth up to twice daily only when needed for chronic knee and RA pain   hydrOXYzine  (VISTARIL ) 25 MG capsule Take 1 capsule (25 mg total) by mouth every 8 (eight) hours as needed.   leflunomide (ARAVA) 20 MG tablet Take 20 mg by mouth daily.   levocetirizine (XYZAL ) 5 MG tablet Take 5 mg by mouth daily as needed for allergies.   levothyroxine  (SYNTHROID ) 75 MCG tablet TAKE 1 TABLET BY MOUTH EVERY DAY   Melatonin 10 MG CAPS  Take 10 mg by mouth at bedtime.   metoprolol  succinate (TOPROL -XL) 25 MG 24 hr tablet TAKE 1 TABLET BY MOUTH EVERY DAY   montelukast  (SINGULAIR ) 10 MG tablet TAKE 1 TABLET BY MOUTH EVERYDAY AT BEDTIME   mupirocin  ointment (BACTROBAN ) 2 % SMARTSIG:1 Milliliter(s) Topical Daily   OVER THE COUNTER MEDICATION Take 1 tablet by mouth at bedtime. Bariatric Iron   rosuvastatin  (CRESTOR ) 5 MG tablet TAKE 1 TABLET BY MOUTH THREE TIMES A WEEK.   Semaglutide , 2 MG/DOSE, (OZEMPIC , 2 MG/DOSE,) 8 MG/3ML SOPN Inject 2 mg into the skin every 7 (seven) days.   sertraline  (ZOLOFT ) 100 MG tablet TAKE 1 TABLET BY MOUTH EVERY DAY   Vitamin D , Ergocalciferol , (DRISDOL ) 1.25 MG (50000 UNIT) CAPS capsule TAKE 1 CAPSULE BY MOUTH ONE TIME PER WEEK     Allergies:   Other, Codeine , and Methotrexate   Social History   Socioeconomic History   Marital status: Married    Spouse name: Not on file   Number of children: Not on file   Years of  education: Not on file   Highest education level: 12th grade  Occupational History   Not on file  Tobacco Use   Smoking status: Never    Passive exposure: Past   Smokeless tobacco: Never  Vaping Use   Vaping status: Never Used  Substance and Sexual Activity   Alcohol use: No   Drug use: No   Sexual activity: Not Currently    Partners: Male    Birth control/protection: Surgical    Comment: Hysterectomy; husband vasectomy-1st intercourse 26 yo-Fewer than 5 partners  Other Topics Concern   Not on file  Social History Narrative   Not on file   Social Drivers of Health   Financial Resource Strain: Low Risk  (08/05/2023)   Overall Financial Resource Strain (CARDIA)    Difficulty of Paying Living Expenses: Not very hard  Food Insecurity: No Food Insecurity (08/05/2023)   Hunger Vital Sign    Worried About Running Out of Food in the Last Year: Never true    Ran Out of Food in the Last Year: Never true  Transportation Needs: No Transportation Needs (08/05/2023)   PRAPARE - Administrator, Civil Service (Medical): No    Lack of Transportation (Non-Medical): No  Physical Activity: Inactive (08/05/2023)   Exercise Vital Sign    Days of Exercise per Week: 0 days    Minutes of Exercise per Session: Not on file  Stress: Stress Concern Present (08/05/2023)   [employer] of Occupational Health - Occupational Stress Questionnaire    Feeling of Stress: To some extent  Social Connections: Socially Isolated (08/05/2023)   Social Connection and Isolation Panel    Frequency of Communication with Friends and Family: More than three times a week    Frequency of Social Gatherings with Friends and Family: Once a week    Attends Religious Services: Patient declined    Database administrator or Organizations: No    Attends Engineer, structural: Not on file    Marital Status: Widowed     Family History: The patient's family history includes Cancer in her maternal  grandfather and maternal grandmother; Colon polyps in her mother; Diabetes in her maternal grandmother; Esophageal cancer in her paternal grandmother; Heart disease in her maternal uncle; Hypertension in her brother and mother; Stroke in her mother. There is no history of Colon cancer, Rectal cancer, or Stomach cancer.  ROS:   Review of Systems  Constitution: Negative for decreased appetite, fever and weight gain.  HENT: Negative for congestion, ear discharge, hoarse voice and sore throat.   Eyes: Negative for discharge, redness, vision loss in right eye and visual halos.  Cardiovascular: Negative for chest pain, dyspnea on exertion, leg swelling, orthopnea and palpitations.  Respiratory: Negative for cough, hemoptysis, shortness of breath and snoring.   Endocrine: Negative for heat intolerance and polyphagia.  Hematologic/Lymphatic: Negative for bleeding problem. Does not bruise/bleed easily.  Skin: Negative for flushing, nail changes, rash and suspicious lesions.  Musculoskeletal: Negative for arthritis, joint pain, muscle cramps, myalgias, neck pain and stiffness.  Gastrointestinal: Negative for abdominal pain, bowel incontinence, diarrhea and excessive appetite.  Genitourinary: Negative for decreased libido, genital sores and incomplete emptying.  Neurological: Negative for brief paralysis, focal weakness, headaches and loss of balance.  Psychiatric/Behavioral: Negative for altered mental status, depression and suicidal ideas.  Allergic/Immunologic: Negative for HIV exposure and persistent infections.    EKGs/Labs/Other Studies Reviewed:    The following studies were reviewed today:   EKG:  The ekg ordered today demonstrates sinus rhythm, heart rate 72 bpm with anteroseptal wall infarction compared to prior EKG no significant change.  Recent Labs: 11/24/2022: ALT 39 07/01/2023: TSH 1.36 09/14/2023: BUN 18; Creat 1.15; Potassium 5.8; Sodium 140  Recent Lipid Panel    Component Value  Date/Time   CHOL 166 11/24/2022 0947   TRIG 83 11/24/2022 0947   HDL 88 11/24/2022 0947   CHOLHDL 1.9 11/24/2022 0947   VLDL 22 02/07/2013 1211   LDLCALC 61 11/24/2022 0947    Physical Exam:    VS:  BP 110/88 (BP Location: Right Arm, Patient Position: Sitting, Cuff Size: Normal)   Pulse 72   Ht 5' 3 (1.6 m)   Wt 160 lb (72.6 kg)   LMP 05/25/2014   SpO2 96%   BMI 28.34 kg/m     Wt Readings from Last 3 Encounters:  11/19/23 160 lb (72.6 kg)  09/14/23 168 lb (76.2 kg)  08/05/23 173 lb (78.5 kg)     GEN: Well nourished, well developed in no acute distress HEENT: Normal NECK: No JVD; No carotid bruits LYMPHATICS: No lymphadenopathy CARDIAC: S1S2 noted,RRR, no murmurs, rubs, gallops RESPIRATORY:  Clear to auscultation without rales, wheezing or rhonchi  ABDOMEN: Soft, non-tender, non-distended, +bowel sounds, no guarding. EXTREMITIES: No edema, No cyanosis, no clubbing MUSCULOSKELETAL:  No deformity  SKIN: Warm and dry NEUROLOGIC:  Alert and oriented x 3, non-focal PSYCHIATRIC:  Normal affect, good insight  ASSESSMENT:    1. Hypertension, unspecified type    PLAN:     Essential hypertension Hypertension with metoprolol . Blood pressure stable. - Continue metoprolol  as prescribed by primary care physician. - Follow up with primary care physician for hypertension management. - Schedule follow-up every two years unless new symptoms arise. - Contact office if experiencing significant pressure, pain, or dyspnea.  Hyperlipidemia Hyperlipidemia well-controlled with current management. Recent blood work shows good cholesterol control. - Continue current management for hyperlipidemia. - Follow up with primary care physician for hyperlipidemia management. Target goal less than 70. She is currently at goal.   Coronary artery calcification - no anginal symptoms.   The patient is in agreement with the above plan. The patient left the office in stable condition.  The patient  will follow up in   Medication Adjustments/Labs and Tests Ordered: Current medicines are reviewed at length with the patient today.  Concerns regarding medicines are outlined above.  Orders Placed This Encounter  Procedures  EKG 12-Lead   No orders of the defined types were placed in this encounter.   Patient Instructions  Medication Instructions:  Your physician recommends that you continue on your current medications as directed. Please refer to the Current Medication list given to you today.  *If you need a refill on your cardiac medications before your next appointment, please call your pharmacy*  Follow-Up: At [facility], you and your health needs are our priority.  As part of our continuing mission to provide you with exceptional heart care, our providers are all part of one team.  This team includes your primary Cardiologist (physician) and Advanced Practice Providers or APPs (Physician Assistants and Nurse Practitioners) who all work together to provide you with the care you need, when you need it.  Your next appointment:   2 year(s)  Provider:   Deanna Boehlke, DO             Adopting a Healthy Lifestyle.  Know what a healthy weight is for you (roughly BMI <25) and aim to maintain this   Aim for 7+ servings of fruits and vegetables daily   65-80+ fluid ounces of water  or unsweet tea for healthy kidneys   Limit to max 1 drink of alcohol per day; avoid smoking/tobacco   Limit animal fats in diet for cholesterol and heart health - choose grass fed whenever available   Avoid highly processed foods, and foods high in saturated/trans fats   Aim for low stress - take time to unwind and care for your mental health   Aim for 150 min of moderate intensity exercise weekly for heart health, and weights twice weekly for bone health   Aim for 7-9 hours of sleep daily   When it comes to diets, agreement about the perfect plan isnt easy to find, even among the  experts. Experts at the [facility] [employer] developed an idea known as the Healthy Eating Plate. Just imagine a plate divided into logical, healthy portions.   The emphasis is on diet quality:   Load up on vegetables and fruits - one-half of your plate: Aim for color and variety, and remember that potatoes dont count.   Go for whole grains - one-quarter of your plate: Whole wheat, barley, wheat berries, quinoa, oats, brown rice, and foods made with them. If you want pasta, go with whole wheat pasta.   Protein power - one-quarter of your plate: Fish, chicken, beans, and nuts are all healthy, versatile protein sources. Limit red meat.   The diet, however, does go beyond the plate, offering a few other suggestions.   Use healthy plant oils, such as olive, canola, soy, corn, sunflower and peanut. Check the labels, and avoid partially hydrogenated oil, which have unhealthy trans fats.   If youre thirsty, drink water . Coffee and tea are good in moderation, but skip sugary drinks and limit milk and dairy products to one or two daily servings.   The type of carbohydrate in the diet is more important than the amount. Some sources of carbohydrates, such as vegetables, fruits, whole grains, and beans-are healthier than others.   Finally, stay active  Signed, Dub Huntsman, DO  11/22/2023 3:41 PM    [facility]

## 2023-11-29 ENCOUNTER — Other Ambulatory Visit: Payer: Self-pay

## 2023-11-29 ENCOUNTER — Ambulatory Visit: Admitting: Licensed Clinical Social Worker

## 2023-11-29 DIAGNOSIS — E7800 Pure hypercholesterolemia, unspecified: Secondary | ICD-10-CM | POA: Diagnosis not present

## 2023-11-29 DIAGNOSIS — F331 Major depressive disorder, recurrent, moderate: Secondary | ICD-10-CM

## 2023-11-29 DIAGNOSIS — E1165 Type 2 diabetes mellitus with hyperglycemia: Secondary | ICD-10-CM | POA: Diagnosis not present

## 2023-11-29 DIAGNOSIS — F411 Generalized anxiety disorder: Secondary | ICD-10-CM
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
## 2023-11-30 ENCOUNTER — Encounter: Payer: Self-pay | Admitting: Internal Medicine

## 2023-11-30 ENCOUNTER — Ambulatory Visit (INDEPENDENT_AMBULATORY_CARE_PROVIDER_SITE_OTHER): Payer: Self-pay | Admitting: Internal Medicine

## 2023-11-30 ENCOUNTER — Encounter: Payer: Self-pay | Attending: Physical Medicine and Rehabilitation | Admitting: Physical Medicine and Rehabilitation

## 2023-11-30 ENCOUNTER — Encounter: Payer: Self-pay | Admitting: Physical Medicine and Rehabilitation

## 2023-11-30 ENCOUNTER — Ambulatory Visit: Payer: Self-pay | Admitting: Internal Medicine

## 2023-11-30 ENCOUNTER — Other Ambulatory Visit: Payer: Self-pay | Admitting: Internal Medicine

## 2023-11-30 VITALS — BP 120/80 | Ht 63.0 in | Wt 165.0 lb

## 2023-11-30 VITALS — BP 120/85 | HR 74 | Ht 63.0 in | Wt 166.0 lb

## 2023-11-30 DIAGNOSIS — E1169 Type 2 diabetes mellitus with other specified complication: Secondary | ICD-10-CM

## 2023-11-30 DIAGNOSIS — M25561 Pain in right knee: Secondary | ICD-10-CM | POA: Diagnosis not present

## 2023-11-30 DIAGNOSIS — R8290 Unspecified abnormal findings in urine: Secondary | ICD-10-CM

## 2023-11-30 DIAGNOSIS — Z5181 Encounter for therapeutic drug level monitoring: Secondary | ICD-10-CM | POA: Insufficient documentation

## 2023-11-30 DIAGNOSIS — M059 Rheumatoid arthritis with rheumatoid factor, unspecified: Secondary | ICD-10-CM

## 2023-11-30 DIAGNOSIS — E785 Hyperlipidemia, unspecified: Secondary | ICD-10-CM | POA: Diagnosis not present

## 2023-11-30 DIAGNOSIS — G8929 Other chronic pain: Secondary | ICD-10-CM | POA: Diagnosis present

## 2023-11-30 DIAGNOSIS — E039 Hypothyroidism, unspecified: Secondary | ICD-10-CM | POA: Diagnosis not present

## 2023-11-30 DIAGNOSIS — M25562 Pain in left knee: Secondary | ICD-10-CM | POA: Diagnosis not present

## 2023-11-30 DIAGNOSIS — E1165 Type 2 diabetes mellitus with hyperglycemia: Secondary | ICD-10-CM

## 2023-11-30 DIAGNOSIS — G894 Chronic pain syndrome: Secondary | ICD-10-CM | POA: Insufficient documentation

## 2023-11-30 DIAGNOSIS — M5440 Lumbago with sciatica, unspecified side: Secondary | ICD-10-CM | POA: Diagnosis not present

## 2023-11-30 DIAGNOSIS — M05742 Rheumatoid arthritis with rheumatoid factor of left hand without organ or systems involvement: Secondary | ICD-10-CM | POA: Insufficient documentation

## 2023-11-30 DIAGNOSIS — Z23 Encounter for immunization: Secondary | ICD-10-CM | POA: Diagnosis not present

## 2023-11-30 DIAGNOSIS — Z9884 Bariatric surgery status: Secondary | ICD-10-CM | POA: Diagnosis not present

## 2023-11-30 DIAGNOSIS — F419 Anxiety disorder, unspecified: Secondary | ICD-10-CM

## 2023-11-30 DIAGNOSIS — F32A Depression, unspecified: Secondary | ICD-10-CM

## 2023-11-30 DIAGNOSIS — M05741 Rheumatoid arthritis with rheumatoid factor of right hand without organ or systems involvement: Secondary | ICD-10-CM | POA: Diagnosis not present

## 2023-11-30 DIAGNOSIS — I10 Essential (primary) hypertension: Secondary | ICD-10-CM | POA: Diagnosis not present

## 2023-11-30 DIAGNOSIS — Z8659 Personal history of other mental and behavioral disorders: Secondary | ICD-10-CM | POA: Diagnosis not present

## 2023-11-30 DIAGNOSIS — Z79899 Other long term (current) drug therapy: Secondary | ICD-10-CM | POA: Insufficient documentation

## 2023-11-30 LAB — LIPID PANEL
Cholesterol: 148 mg/dL (ref <200)
HDL: 81 mg/dL (ref >=50)
LDL Cholesterol (Calc): 49 mg/dL
Non-HDL Cholesterol (Calc): 67 mg/dL (ref <130)
Total CHOL/HDL Ratio: 1.8 (calc) (ref <5.0)
Triglycerides: 94 mg/dL (ref <150)

## 2023-11-30 LAB — MICROALBUMIN / CREATININE URINE RATIO
Creatinine, Urine: 134 mg/dL (ref 20–275)
Microalb Creat Ratio: 4 mg/g{creat} (ref <30)
Microalb, Ur: 0.6 mg/dL

## 2023-11-30 LAB — HEPATIC FUNCTION PANEL
AG Ratio: 1.6 (calc) (ref 1.0–2.5)
ALT: 39 U/L — ABNORMAL HIGH (ref 6–29)
AST: 32 U/L (ref 10–35)
Albumin: 4 g/dL (ref 3.6–5.1)
Alkaline phosphatase (APISO): 98 U/L (ref 37–153)
Bilirubin, Direct: 0.2 mg/dL (ref 0.0–0.2)
Globulin: 2.5 g/dL (ref 1.9–3.7)
Indirect Bilirubin: 0.5 mg/dL (ref 0.2–1.2)
Total Bilirubin: 0.7 mg/dL (ref 0.2–1.2)
Total Protein: 6.5 g/dL (ref 6.1–8.1)

## 2023-11-30 LAB — HEMOGLOBIN A1C
Hgb A1c MFr Bld: 4.8 % (ref <5.7)
Mean Plasma Glucose: 91 mg/dL
eAG (mmol/L): 5 mmol/L

## 2023-11-30 MED ORDER — DIAZEPAM 5 MG PO TABS: ORAL_TABLET | ORAL | 1 refills | Status: AC

## 2023-11-30 MED ORDER — BLOOD GLUCOSE MONITORING SUPPL DEVI: 0 refills | Status: DC

## 2023-11-30 MED ORDER — BLOOD GLUCOSE MONITOR KIT: PACK | 0 refills | Status: DC

## 2023-11-30 NOTE — Patient Instructions (Signed)
 Foods that may reduce pain: 1) Ginger (especially studied for arthritis)- reduce leukotriene production to decrease inflammation 2) Blueberries- high in phytonutrients that decrease inflammation 3) Salmon- marine omega-3s reduce joint swelling and pain 4) Pumpkin seeds- reduce inflammation 5) dark chocolate- reduces inflammation 6) turmeric- reduces inflammation 7) tart cherries - reduce pain and stiffness 8) extra virgin olive oil - its compound olecanthal helps to block prostaglandins  9) chili peppers- can be eaten or applied topically via capsaicin 10) mint- helpful for headache, muscle aches, joint pain, and itching 11) garlic- reduces inflammation 12) Green tea- reduces inflammation and oxidative stress, helps with weight loss, may reduce the risk of cancer, recommend Double Green Matcha [place] of Tea daily  Link to further information on diet for chronic pain: [email or web address]

## 2023-11-30 NOTE — Progress Notes (Signed)
 Subjective:    Patient ID: Mary Herman, female    DOB: [birth year], 63 y.o.   MRN: [id number]  HPI Mrs. Mckeon is a 63 year ols woman who presents to establish care for chronic low back pain.  1) Chronic bilateral knee pain: -she has two knees that need to be replaced -she takes hydrocodone  and this provides relief- she takes at least one per day -her left knee twerked and she was in extreme pain fro some time -she was receiving care at [facility] -she gets steroid injections from Dr. Vernetta  2) Bilateral hand RA:  -she is on Humera   Pain Inventory Average Pain 2 Pain Right Now 4 My pain is intermittent, constant, sharp, dull, and stabbing  In the last 24 hours, has pain interfered with the following? General activity 2 Relation with others 2 Enjoyment of life 2 What TIME of day is your pain at its worst? daytime Sleep (in general) Poor  Pain is worse with: walking, standing, and some activites Pain improves with: rest, heat/ice, medication, and injections Relief from Meds: Good  walk without assistance walk with assistance use a cane use a walker how many minutes can you walk? 10 ability to climb steps?  yes do you drive?  yes Do you have any goals in this area?  yes  not employed: date last employed not working  weakness tremor trouble walking spasms depression  Any changes since last visit?  no  Any changes since last visit?  no    Family History  Problem Relation Age of Onset   Colon polyps Mother    Hypertension Mother    Stroke Mother    Hypertension Brother    Heart disease Maternal Uncle    Diabetes Maternal Grandmother    Cancer Maternal Grandmother        THROAT CA   Cancer Maternal Grandfather        THROAT CA   Esophageal cancer Paternal Grandmother    Colon cancer Neg Hx    Rectal cancer Neg Hx    Stomach cancer Neg Hx    Social History   Socioeconomic History   Marital status: Married    Spouse name:  Not on file   Number of children: Not on file   Years of education: Not on file   Highest education level: 12th grade  Occupational History   Not on file  Tobacco Use   Smoking status: Never    Passive exposure: Past   Smokeless tobacco: Never  Vaping Use   Vaping status: Never Used  Substance and Sexual Activity   Alcohol use: No   Drug use: No   Sexual activity: Not Currently    Partners: Male    Birth control/protection: Surgical    Comment: Hysterectomy; husband vasectomy-1st intercourse 26 yo-Fewer than 5 partners  Other Topics Concern   Not on file  Social History Narrative   Not on file   Social Drivers of Health   Financial Resource Strain: Low Risk  (08/05/2023)   Overall Financial Resource Strain (CARDIA)    Difficulty of Paying Living Expenses: Not very hard  Food Insecurity: No Food Insecurity (08/05/2023)   Hunger Vital Sign    Worried About Running Out of Food in the Last Year: Never true    Ran Out of Food in the Last Year: Never true  Transportation Needs: No Transportation Needs (08/05/2023)   PRAPARE - Administrator, Civil Service (Medical): No  Lack of Transportation (Non-Medical): No  Physical Activity: Inactive (08/05/2023)   Exercise Vital Sign    Days of Exercise per Week: 0 days    Minutes of Exercise per Session: Not on file  Stress: Stress Concern Present (08/05/2023)   [employer] of Occupational Health - Occupational Stress Questionnaire    Feeling of Stress: To some extent  Social Connections: Socially Isolated (08/05/2023)   Social Connection and Isolation Panel    Frequency of Communication with Friends and Family: More than three times a week    Frequency of Social Gatherings with Friends and Family: Once a week    Attends Religious Services: Patient declined    Database administrator or Organizations: No    Attends Engineer, structural: Not on file    Marital Status: Widowed   Past Surgical History:   Procedure Laterality Date   CARDIAC CATHETERIZATION  12-07-2006  [facility]   NORMAL CORONARIES ARTERIES/ NORMAL LVF/ MILD INCREASED END-DIASTOLIC PRESSURE   CESAREAN SECTION  1998   CHOLECYSTECTOMY  1980'S   CYSTOSCOPY WITH RETROGRADE PYELOGRAM, URETEROSCOPY AND STENT PLACEMENT Bilateral 12/26/2012   Procedure: cystoscopy with bilateral retrogrades, bilateral ureteroscopy ;  Surgeon: Alm GORMAN Fragmin, MD;  Location: [facility];  Service: Urology;  Laterality: Bilateral;   CYSTOSCOPY WITH STENT PLACEMENT Bilateral 12/26/2012   Procedure: CYSTOSCOPY WITH STENT PLACEMENT;  Surgeon: Alm GORMAN Fragmin, MD;  Location: [facility];  Service: Urology;  Laterality: Bilateral;   CYSTOSCOPY/ HYDRODISTENTION  YRS AGO   DILATATION & CURETTAGE/HYSTEROSCOPY WITH MYOSURE N/A 11/25/2018   Procedure: DILATATION & CURETTAGE/HYSTEROSCOPY WITH MYOSURE;  Surgeon: Rockney Evalene SQUIBB, MD;  Location: [facility];  Service: Gynecology;  Laterality: N/A;   ETHMOIDECTOMY Right 09/25/2022   Procedure: RIGHT ANTERIOR ETHMOIDECTOMY;  Surgeon: Llewellyn Gerard LABOR, DO;  Location: [facility];  Service: ENT;  Laterality: Right;   HERNIA REPAIR     HOLMIUM LASER APPLICATION Right 12/26/2012   Procedure: HOLMIUM LASER APPLICATION;  Surgeon: Alm GORMAN Fragmin, MD;  Location: [facility];  Service: Urology;  Laterality: Right;   KNEE ARTHROSCOPY Left 1992   MAXILLARY ANTROSTOMY Right 09/25/2022   Procedure: RIGHT MAXILLARY ANTROSTOMY;  Surgeon: Llewellyn Gerard LABOR, DO;  Location: [facility];  Service: ENT;  Laterality: Right;   NASAL TURBINATE REDUCTION Bilateral 09/25/2022   Procedure: BILATERAL INFERIOR TURBINATE REDUCTION/SUBMUCOSAL RESECTION;  Surgeon: Llewellyn Gerard LABOR, DO;  Location: [facility];  Service: ENT;  Laterality: Bilateral;   ROBOTIC ASSISTED TOTAL HYSTERECTOMY WITH BILATERAL SALPINGO OOPHERECTOMY N/A 01/31/2019   Procedure: XI ROBOTIC ASSISTED TOTAL HYSTERECTOMY WITH BILATERAL SALPINGO  OOPHORECTOMY;  Surgeon: Eloy Herring, MD;  Location: [facility];  Service: Gynecology;  Laterality: N/A;   ROUX-EN-Y PROCEDURE  07/25/2007   SHOULDER ARTHROSCOPY Right 2011   SINUS ENDO WITH FUSION Right 09/25/2022   Procedure: FUNCTIONAL ENDOSCOPIC SINUS WITH FUSION;  Surgeon: Llewellyn Gerard LABOR, DO;  Location: [facility];  Service: ENT;  Laterality: Right;   TMJ ARTHROPLASTY  1990'S   BILATERAL UPPER   TOTAL ABDOMINAL HYSTERECTOMY  01/31/2019   Past Medical History:  Diagnosis Date   Anemia    Chronic back pain    Depression    Diabetes mellitus without complication (HCC)    takes on sundays - semaglutide    Dyspnea    with activity   Environmental allergies    Family history of adverse reaction to anesthesia    daughter- n/v       GERD (gastroesophageal reflux disease)  Hematuria    History of cystitis    INTERSTITIAL CYSTITIS--  NO ISSUES FOR YRS   History of gastroesophageal reflux (GERD)    DENIES ISSUES SINCE GASTRIC BY PASS   History of hypertension    NO ISSUE SINCE WT LOSS AFTER GASTRIC BYPASS   History of kidney stones    surgery to remove   History of obstructive sleep apnea    USED CPAP--  NO ISSUE SINCE WT LOSS AFTER GASTRIC BYPASS   Hypertension    Hypothyroidism    Pneumonia    x 1   Pre-diabetes 06/2021   Rheumatoid arthritis (HCC)    Right ureteral stone    Sleep apnea    does not use cpap   Trochanteric bursitis of both hips    Tuberculosis    patient denies this dx as of 09/09/22   BP 120/85   Pulse 74   Ht 5' 3 (1.6 m)   Wt 166 lb (75.3 kg)   LMP 05/25/2014   SpO2 98%   BMI 29.41 kg/m   Opioid Risk Score:   Fall Risk Score:  `1  Depression screen [facility] 2/9     11/30/2023   12:49 PM 09/14/2023   11:24 AM 05/21/2023   11:28 AM 05/15/2022   10:00 AM 11/20/2021    9:33 AM 05/13/2021   10:01 AM 05/10/2020   10:21 AM  Depression screen PHQ 2/9  Decreased Interest 0 0 2 0 0 0 1  Down, Depressed, Hopeless 0 2 1 0 0 0 1  PHQ - 2 Score 0 2 3 0 0 0 2   Altered sleeping 1 1 1    1   Tired, decreased energy 1 2 2    2   Change in appetite 0 2 2      Feeling bad or failure about yourself  0 0 2    1  Trouble concentrating 0 1 2    0  Moving slowly or fidgety/restless 0 0 0    1  Suicidal thoughts 0 0 0    0  PHQ-9 Score 2 8 12    7   Difficult doing work/chores  Not difficult at all     Somewhat difficult    Review of Systems  Musculoskeletal:  Positive for back pain.       Pain in both wrist, hands, both knees  All other systems reviewed and are negative.      Objective:   Physical Exam Gen: no distress, normal appearing HEENT: oral mucosa pink and moist, NCAT Cardio: Reg rate Chest: normal effort, normal rate of breathing Abd: soft, non-distended Ext: no edema Psych: pleasant, normal affect Skin: intact Neuro: Alert and oriented x3 MSK: left knee is TTTP       Assessment & Plan:  1) Chronic Pain Syndrome secondary to bilateral knee OA -Discussed current symptoms of pain and history of pain.  -Discussed benefits of exercise in reducing pain. -UDS and pain contract performed, discussed that if contains expected metabolites will prescribe hydrocodone  5mg  BID prn -Discussed following foods that may reduce pain: 1) Ginger (especially studied for arthritis)- reduce leukotriene production to decrease inflammation 2) Blueberries- high in phytonutrients that decrease inflammation 3) Salmon- marine omega-3s reduce joint swelling and pain 4) Pumpkin seeds- reduce inflammation 5) dark chocolate- reduces inflammation 6) turmeric- reduces inflammation 7) tart cherries - reduce pain and stiffness 8) extra virgin olive oil - its compound olecanthal helps to block prostaglandins  9) chili peppers- can be eaten  or applied topically via capsaicin 10) mint- helpful for headache, muscle aches, joint pain, and itching 11) garlic- reduces inflammation 12) Green tea- reduces inflammation and oxidative stress, helps with weight loss, may  reduce the risk of cancer, recommend Double Green Matcha [place] of Tea daily  Link to further information on diet for chronic pain: http://www.bray.com/    2) Low back pain: -Discussed Qutenza as an option for neuropathic pain control. Discussed that this is a capsaicin patch, stronger than capsaicin cream. Discussed that it is currently approved for diabetic peripheral neuropathy and post-herpetic neuralgia, but that it has also shown benefit in treating other forms of neuropathy. Provided patient with link to site to learn more about the patch: https://www.clark.biz/. Discussed that the patch would be placed in office and benefits usually last 3 months. Discussed that unintended exposure to capsaicin can cause severe irritation of eyes, mucous membranes, respiratory tract, and skin, but that Qutenza is a local treatment and does not have the systemic side effects of other nerve medications. Discussed that there may be pain, itching, erythema, and decreased sensory function associated with the application of Qutenza. Side effects usually subside within 1 week. A cold pack of analgesic medications can help with these side effects. Blood pressure can also be increased due to pain associated with administration of the patch.  We are recommending Qutenza 8% capsaicin to treat this patient's pain. Qutenza is the first-line treatment option recommended for diabetic peripheral neuropathy by the AACE and ADA.   Qutenza is a safer option for this patient due to the following: Gabapentin  use has been associated with increased risk of dementia Lyrica use has been associated with increased risk of heart failure Cymblata, Venlafaxine, and other SSRIs/SNRIs are associated with increased weight gain    3) RA bilateral hands: -recommended voltaren gel

## 2023-11-30 NOTE — Progress Notes (Shared)
 Patient Care Team: Perri Ronal PARAS, MD as PCP - General (Internal Medicine) Sheena Pugh, DO as PCP - Cardiology (Cardiology) Himmelrich, Camie RAMAN, RD (Inactive) as Dietitian Tobb, Kardie, DO as Consulting Physician (Cardiology)  Visit Date: 11/30/23  Subjective:    Patient ID: Mary Herman , Female   DOB: [birth year], 63 y.o.    MRN: [id number]   63 y.o. Female presents today for 6 month follow up for Hypertension, Hyperlipidemia and impaired glucose tolerance. Patient has a past medical history of Chronic back pain, Depression, Dyspnea, Environmental allergies, GE Reflux, Kidney stones, OSA, Rheumatoid arthritis, Right ureteral stone, Trochanteric bursitis, both hips .   Recently started counseling with Damien Junk at [facility] behavioral health. She says that she doesn't eat as much.   History of Hypertension treated with Metoprolol  succinate 25 mg daily. Blood pressure today is normal at 120/80.   History of Hyperlipidemia treated with Rosuvastatin  5 mg three times weekly. 11/29/2023 Lipid panel normal.   History of Impaired glucose tolerance, 11/29/2023 HgbA1c 4.8 decreased from 11/24/2022 5.0. Currently on Ozempic  2 mg injections weekly.   History of Hypothyroidism treated with Levothyroxine  75 mcg.   History of Rheumatoid arthritis treated withw Arava 20 mg daily   History of Depression treated with Wellbutrin  XL 300 mg daily, Celexa  20 mg daily, Valium  5 mg as needed.   Labs 11/29/2023 ALT 39, Otherwise WNL    07/01/2023 Coronary calcium  score: 40    10/06/2023 Mammogram no mammographic evidence of maligancy.   Vaccine counseling: Influenza vaccine received today, Shingles and Covid-19 vaccine due. UTD on Tetanus and RSV.   Past Medical History:  Diagnosis Date   Anemia    Chronic back pain    Depression    Diabetes mellitus without complication (HCC)    takes on sundays - semaglutide    Dyspnea    with activity   Environmental allergies    Family history of  adverse reaction to anesthesia    daughter- n/v       GERD (gastroesophageal reflux disease)    Hematuria    History of cystitis    INTERSTITIAL CYSTITIS--  NO ISSUES FOR YRS   History of gastroesophageal reflux (GERD)    DENIES ISSUES SINCE GASTRIC BY PASS   History of hypertension    NO ISSUE SINCE WT LOSS AFTER GASTRIC BYPASS   History of kidney stones    surgery to remove   History of obstructive sleep apnea    USED CPAP--  NO ISSUE SINCE WT LOSS AFTER GASTRIC BYPASS   Hypertension    Hypothyroidism    Pneumonia    x 1   Pre-diabetes 06/2021   Rheumatoid arthritis (HCC)    Right ureteral stone    Sleep apnea    does not use cpap   Trochanteric bursitis of both hips    Tuberculosis    patient denies this dx as of 09/09/22     Family History  Problem Relation Age of Onset   Colon polyps Mother    Hypertension Mother    Stroke Mother    Hypertension Brother    Heart disease Maternal Uncle    Diabetes Maternal Grandmother    Cancer Maternal Grandmother        THROAT CA   Cancer Maternal Grandfather        THROAT CA   Esophageal cancer Paternal Grandmother    Colon cancer Neg Hx    Rectal cancer Neg Hx    Stomach cancer Neg  Hx    Family History Narrative: Father died at age 49 with history of alcoholism but cause of death not known to patient.  Brother with history of brain aneurysm.  Mother died at age 76 with history of stroke and had lung disease.  No sisters.  1 brother with history of hypertension and another brother in his 70s whose health status not known to patient.     Social history: Has worked in Marketing executive.  Most recently worked at [employer].  Also has worked in a Radio broadcast assistant.  Non-smoker.  Does not consume alcohol.  She has 1 daughter who has had a number of operations.  She has a high school education.  Does not consume alcohol.      Review of Systems  All other systems reviewed and are negative.       Objective:   Vitals:  BP 120/80   Ht 5' 3 (1.6 m)   Wt 165 lb (74.8 kg)   LMP 05/25/2014   BMI 29.23 kg/m    Physical Exam Vitals and nursing note reviewed.  Constitutional:      General: She is not in acute distress.    Appearance: Normal appearance. She is not toxic-appearing.  HENT:     Head: Normocephalic and atraumatic.  Neck:     Thyroid : No thyroid  mass, thyromegaly or thyroid  tenderness.     Vascular: No carotid bruit.  Cardiovascular:     Rate and Rhythm: Normal rate and regular rhythm. No extrasystoles are present.    Pulses: Normal pulses.     Heart sounds: Normal heart sounds. No murmur heard.    No friction rub. No gallop.  Pulmonary:     Effort: Pulmonary effort is normal. No respiratory distress.     Breath sounds: Normal breath sounds. No wheezing or rales.  Skin:    General: Skin is warm and dry.  Neurological:     Mental Status: She is alert and oriented to person, place, and time. Mental status is at baseline.  Psychiatric:        Mood and Affect: Mood normal.        Behavior: Behavior normal.        Thought Content: Thought content normal.        Judgment: Judgment normal.       Results:   Studies obtained and personally reviewed by me:  07/01/2023 Coronary calcium  score: 40    10/06/2023 Mammogram no mammographic evidence of maligancy.    Labs:       Component Value Date/Time   NA 140 09/14/2023 1123   K 5.8 (H) 09/14/2023 1123   CL 107 09/14/2023 1123   CO2 28 09/14/2023 1123   GLUCOSE 95 09/14/2023 1123   BUN 18 09/14/2023 1123   CREATININE 1.15 (H) 09/14/2023 1123   CALCIUM  9.4 09/14/2023 1123   PROT 6.5 11/29/2023 0937   ALBUMIN 3.7 01/27/2019 0855   AST 32 11/29/2023 0937   ALT 39 (H) 11/29/2023 0937   ALKPHOS 118 01/27/2019 0855   BILITOT 0.7 11/29/2023 0937   GFRNONAA >60 09/25/2022 0803   GFRNONAA 82 05/10/2020 1005   GFRAA 95 05/10/2020 1005     Lab Results  Component Value Date   WBC 3.5 (L) 09/25/2022   HGB 13.0 09/25/2022   HCT  41.4 09/25/2022   MCV 92.0 09/25/2022   PLT 163 09/25/2022    Lab Results  Component Value Date   CHOL 148 11/29/2023   HDL 81 11/29/2023  LDLCALC 49 11/29/2023   TRIG 94 11/29/2023   CHOLHDL 1.8 11/29/2023    Lab Results  Component Value Date   HGBA1C 4.8 11/29/2023     Lab Results  Component Value Date   TSH 1.36 07/01/2023      Assessment & Plan:   Hypertension: treated with Metoprolol  succinate 25 mg daily. Blood pressure today is normal at 120/80.   Hyperlipidemia: treated with Rosuvastatin  5 mg three times weekly. 11/29/2023 Lipid panel normal.   Impaired glucose tolerance, 11/29/2023 HgbA1c 4.8 decreased from 11/24/2022 5.0. Currently on Ozempic  2 mg injections weekly.   Blood glucose monitoring kit ordered.   Hypothyroidism treated with Levothyroxine  75 mcg.   Rheumatoid arthritis treated withw Arava 20 mg daily   Depression: treated with Wellbutrin  XL 300 mg daily, Celexa  20 mg daily, Valium  5 mg as needed.    Valium  refilled.   07/01/2023 Coronary calcium  score: 40    10/06/2023 Mammogram no mammographic evidence of maligancy.    I,Makayla C Reid,acting as a scribe for Ronal JINNY Hailstone, MD.,have documented all relevant documentation on the behalf of Ronal JINNY Hailstone, MD,as directed by  Ronal JINNY Hailstone, MD while in the presence of Ronal JINNY Hailstone, MD.

## 2023-11-30 NOTE — Addendum Note (Signed)
 Addended by: Colleena Kurtenbach M on: 11/30/2023 01:29 PM   Modules accepted: Orders

## 2023-12-01 NOTE — Patient Instructions (Signed)
 Continue current meds. Return in 6 months or as needed.

## 2023-12-03 ENCOUNTER — Other Ambulatory Visit: Payer: Self-pay | Admitting: Physical Medicine and Rehabilitation

## 2023-12-03 ENCOUNTER — Encounter: Payer: Self-pay | Admitting: Physical Medicine and Rehabilitation

## 2023-12-03 LAB — DRUG TOX MONITOR 1 W/CONF, ORAL FLD
Amphetamines: NEGATIVE ng/mL (ref <10)
Barbiturates: NEGATIVE ng/mL (ref <10)
Benzodiazepines: NEGATIVE ng/mL (ref <0.50)
Buprenorphine: NEGATIVE ng/mL (ref <0.10)
Cocaine: NEGATIVE ng/mL (ref <5.0)
Codeine: NEGATIVE ng/mL (ref <2.5)
Dihydrocodeine: NEGATIVE ng/mL (ref <2.5)
Fentanyl: NEGATIVE ng/mL (ref <0.10)
Heroin Metabolite: NEGATIVE ng/mL (ref <1.0)
Hydrocodone: 16.1 ng/mL — ABNORMAL HIGH (ref <2.5)
Hydromorphone: NEGATIVE ng/mL (ref <2.5)
MARIJUANA: NEGATIVE ng/mL (ref <2.5)
MDMA: NEGATIVE ng/mL (ref <10)
Meprobamate: NEGATIVE ng/mL (ref <2.5)
Methadone: NEGATIVE ng/mL (ref <5.0)
Morphine: NEGATIVE ng/mL (ref <2.5)
Nicotine Metabolite: NEGATIVE ng/mL (ref <5.0)
Norhydrocodone: 2.7 ng/mL — ABNORMAL HIGH (ref <2.5)
Noroxycodone: NEGATIVE ng/mL (ref <2.5)
Opiates: POSITIVE ng/mL — AB (ref <2.5)
Oxycodone: NEGATIVE ng/mL (ref <2.5)
Oxymorphone: NEGATIVE ng/mL (ref <2.5)
Phencyclidine: NEGATIVE ng/mL (ref <10)
Tapentadol: NEGATIVE ng/mL (ref <5.0)
Tramadol: NEGATIVE ng/mL (ref <5.0)
Zolpidem: NEGATIVE ng/mL (ref <5.0)

## 2023-12-03 LAB — DRUG TOX ALC METAB W/CON, ORAL FLD: Alcohol Metabolite: NEGATIVE ng/mL (ref <25)

## 2023-12-03 MED ORDER — HYDROCODONE-ACETAMINOPHEN 5-325 MG PO TABS: 1.0000 | ORAL_TABLET | Freq: Two times a day (BID) | ORAL | 0 refills | Status: DC | PRN

## 2023-12-06 ENCOUNTER — Telehealth: Payer: Self-pay

## 2023-12-06 ENCOUNTER — Other Ambulatory Visit: Payer: Self-pay | Admitting: Physical Medicine and Rehabilitation

## 2023-12-06 ENCOUNTER — Other Ambulatory Visit (HOSPITAL_COMMUNITY): Payer: Self-pay

## 2023-12-06 MED ORDER — HYDROCODONE-ACETAMINOPHEN 5-325 MG PO TABS
1.0000 | ORAL_TABLET | Freq: Two times a day (BID) | ORAL | 0 refills | Status: DC | PRN
  Filled 2023-12-06 – 2023-12-10 (×2): qty 60, 30d supply, fill #0

## 2023-12-06 NOTE — Telephone Encounter (Signed)
 Please send a new Rx for Hydrocodone  5/325 MG to [facility].   The prior Rx to [facility] has been cancelled.   Thank you

## 2023-12-07 ENCOUNTER — Encounter: Payer: Self-pay | Admitting: Cardiology

## 2023-12-08 ENCOUNTER — Telehealth: Payer: Self-pay

## 2023-12-08 NOTE — Telephone Encounter (Signed)
 PA For Hydrocodone  5-325 MG Approved until  06/05/2024. SABRA

## 2023-12-08 NOTE — Telephone Encounter (Signed)
 Task completed.

## 2023-12-10 ENCOUNTER — Other Ambulatory Visit: Payer: Self-pay

## 2023-12-10 ENCOUNTER — Other Ambulatory Visit (HOSPITAL_COMMUNITY): Payer: Self-pay

## 2023-12-13 ENCOUNTER — Ambulatory Visit: Admitting: Licensed Clinical Social Worker

## 2023-12-13 DIAGNOSIS — F411 Generalized anxiety disorder: Secondary | ICD-10-CM | POA: Diagnosis not present

## 2023-12-13 DIAGNOSIS — F331 Major depressive disorder, recurrent, moderate: Secondary | ICD-10-CM

## 2023-12-13 NOTE — Progress Notes (Signed)
 [facility] Counselor/Therapist Progress Note  Patient ID: Mary Herman, MRN: [id number]    Date: 12/13/23  Time Spent: 1104  am - 1200 pm : 56 Minutes  Treatment Type: Individual Therapy.  Reported Symptoms: Spouse of 30 years passed in February. She reports that she lacks a social support network. Patient reports that he had been ill and disabled. He began passing out and he ended up having a heart attack. Patient reports symptoms of depression and anxiety. Lost job in December.    Mental Status Exam: Appearance:  Casual     Behavior: Appropriate  Motor: Normal  Speech/Language:  Clear and Coherent  Affect: Appropriate  Mood: normal  Thought process: normal  Thought content:   WNL  Sensory/Perceptual disturbances:   WNL  Orientation: oriented to person, place, time/date, situation, day of week, month of year, and year  Attention: Good  Concentration: Good  Memory: WNL  Fund of knowledge:  Good  Insight:   Good  Judgment:  Good  Impulse Control: Good    Risk Assessment: Danger to Self:  No Self-injurious Behavior: No Danger to Others: No Duty to Warn:no Physical Aggression / Violence:No  Access to Firearms a concern: No  Gang Involvement:No    Subjective:    Mary Herman participated in person from office,located at [employer].   Mary Herman consented to treatment. Therapist participated from office.    Mary Herman reports that she is struggling with being at home alone. She reports that she doesn't like being there and and she states she sees her daughter on weekends. Patient reports that she will try to do something with her neighbor who also lost her spouse. She reports that she has trouble going places where she and her husband went. Patient reports that she is also having issues with the weather change. She is aware that she will be stuck at home more. Patient admits that she struggles to be motivated and reports a need to get more done.   Clinician  actively engaged in discussion with patient and actively listened. Clinician provided support and encouragement. Clinician and patient processed ideas for building a new normal and having a routine that will help her through the challenges of the winter months.  Clinician and patient also discussed having the new baby will also help with her being lonely.   Mary Herman was fully engaged in discussion with Clinician and was motivated for treatment. Mary Herman is open to feedback and eager to gain insight into her grief and ways to move forward and build a new normal. Patient is to use CBT, mindfulness and coping skills to help manage decrease symptoms associated with their diagnosis. Treatment planning to be reviewed by 10/12/2024.     Interventions: Cognitive Behavioral Therapy, Dialectical Behavioral Therapy, Mindfulness Meditation, Motivational Interviewing, Solution-Oriented/Positive Psychology, and Grief Therapy   Diagnosis: Major Depressive Disorder, recurrent moderate, Generalized Anxiety Disorder    Damien Junk MSW, LCSW/DATE 12/13/2023

## 2023-12-20 ENCOUNTER — Other Ambulatory Visit (HOSPITAL_COMMUNITY): Payer: Self-pay

## 2023-12-20 ENCOUNTER — Telehealth (HOSPITAL_COMMUNITY): Payer: Self-pay

## 2023-12-20 MED ORDER — OZEMPIC (2 MG/DOSE) 8 MG/3ML ~~LOC~~ SOPN
2.0000 mg | PEN_INJECTOR | SUBCUTANEOUS | 2 refills | Status: AC
  Filled 2023-12-20 – 2023-12-21 (×2): qty 3, 28d supply, fill #0
  Filled 2024-01-19: qty 3, 28d supply, fill #1
  Filled 2024-02-19: qty 3, 28d supply, fill #2
  Filled 2024-03-18: qty 3, 28d supply, fill #3

## 2023-12-21 ENCOUNTER — Other Ambulatory Visit: Payer: Self-pay

## 2023-12-21 ENCOUNTER — Other Ambulatory Visit (HOSPITAL_COMMUNITY): Payer: Self-pay

## 2023-12-21 ENCOUNTER — Telehealth: Payer: Self-pay | Admitting: Pharmacy Technician

## 2023-12-21 NOTE — Telephone Encounter (Signed)
 Pharmacy Patient Advocate Encounter   Received notification from Pt Calls Messages that prior authorization for ozempic  is required/requested.   Insurance verification completed.   The patient is insured through [facility] MEDICAID.   Per test claim: PA required; PA submitted to above mentioned insurance via Latent Key/confirmation #/EOC [id number] Status is pending

## 2023-12-21 NOTE — Telephone Encounter (Signed)
 Pharmacy Patient Advocate Encounter  Received notification from [facility] MEDICAID that Prior Authorization for ozempic  has been APPROVED from 12/21/23 to 12/20/24   PA #/Case ID/Reference #: [id number]

## 2023-12-21 NOTE — Telephone Encounter (Signed)
 PA request has been Submitted. New Encounter has been or will be created for follow up. For additional info see Pharmacy Prior Auth telephone encounter from 12/21/23.

## 2023-12-27 ENCOUNTER — Encounter: Payer: Self-pay | Admitting: Radiology

## 2023-12-27 ENCOUNTER — Other Ambulatory Visit: Payer: Self-pay | Admitting: Internal Medicine

## 2023-12-27 ENCOUNTER — Ambulatory Visit (INDEPENDENT_AMBULATORY_CARE_PROVIDER_SITE_OTHER): Admitting: Licensed Clinical Social Worker

## 2023-12-27 DIAGNOSIS — F331 Major depressive disorder, recurrent, moderate: Secondary | ICD-10-CM | POA: Diagnosis not present

## 2023-12-27 DIAGNOSIS — E1165 Type 2 diabetes mellitus with hyperglycemia: Secondary | ICD-10-CM

## 2023-12-27 DIAGNOSIS — F411 Generalized anxiety disorder: Secondary | ICD-10-CM | POA: Diagnosis not present

## 2023-12-27 NOTE — Progress Notes (Unsigned)
 [facility] Counselor/Therapist Progress Note  Patient ID: ALANNIS HSIA, MRN: [id number]    Date: 12/27/23  Time Spent: 1105  am - 1200 pm : 55 Minutes  Treatment Type: Individual Therapy.  Reported Symptoms: Spouse of 30 years passed in February. She reports that she lacks a social support network. Patient reports that he had been ill and disabled. He began passing out and he ended up having a heart attack. Patient reports symptoms of depression and anxiety. Lost job in December.    Mental Status Exam: Appearance:  Casual     Behavior: Appropriate  Motor: Normal  Speech/Language:  Clear and Coherent  Affect: Appropriate  Mood: normal  Thought process: normal  Thought content:   WNL  Sensory/Perceptual disturbances:   WNL  Orientation: oriented to person, place, time/date, situation, day of week, month of year, and year  Attention: Good  Concentration: Good  Memory: WNL  Fund of knowledge:  Good  Insight:   Good  Judgment:  Good  Impulse Control: Good    Risk Assessment: Danger to Self:  No Self-injurious Behavior: No Danger to Others: No Duty to Warn:no Physical Aggression / Violence:No  Access to Firearms a concern: No  Gang Involvement:No    Subjective:    Nathanel JONETTA Ku participated in person from office,located at [employer].   Raysha consented to treatment. Therapist participated from office.    Raynesha presented for her session stating that nothing hhas changed.     Interventions: {PSY:[id number]}  Diagnosis: No diagnosis found.   Damien Junk MSW, LCSW/DATE

## 2023-12-28 ENCOUNTER — Other Ambulatory Visit: Payer: Self-pay

## 2023-12-28 ENCOUNTER — Other Ambulatory Visit (HOSPITAL_COMMUNITY): Payer: Self-pay

## 2023-12-28 ENCOUNTER — Encounter: Attending: Registered Nurse | Admitting: Registered Nurse

## 2023-12-28 ENCOUNTER — Encounter: Payer: Self-pay | Admitting: Registered Nurse

## 2023-12-28 VITALS — BP 135/86 | HR 89 | Ht 63.0 in | Wt 164.2 lb

## 2023-12-28 DIAGNOSIS — G8929 Other chronic pain: Secondary | ICD-10-CM | POA: Insufficient documentation

## 2023-12-28 DIAGNOSIS — M545 Low back pain, unspecified: Secondary | ICD-10-CM | POA: Diagnosis not present

## 2023-12-28 DIAGNOSIS — Z79899 Other long term (current) drug therapy: Secondary | ICD-10-CM | POA: Diagnosis not present

## 2023-12-28 DIAGNOSIS — G894 Chronic pain syndrome: Secondary | ICD-10-CM | POA: Diagnosis not present

## 2023-12-28 DIAGNOSIS — M25562 Pain in left knee: Secondary | ICD-10-CM | POA: Insufficient documentation

## 2023-12-28 DIAGNOSIS — Z5181 Encounter for therapeutic drug level monitoring: Secondary | ICD-10-CM | POA: Diagnosis not present

## 2023-12-28 DIAGNOSIS — M25561 Pain in right knee: Secondary | ICD-10-CM | POA: Diagnosis not present

## 2023-12-28 MED ORDER — HYDROCODONE-ACETAMINOPHEN 5-325 MG PO TABS
1.0000 | ORAL_TABLET | Freq: Two times a day (BID) | ORAL | 0 refills | Status: DC | PRN
  Filled 2023-12-28 – 2024-01-10 (×2): qty 60, 30d supply, fill #0

## 2023-12-28 NOTE — Progress Notes (Signed)
 Subjective:    Patient ID: Mary Herman, female    DOB: 11-Feb-1961, 63 y.o.   MRN: [id number]  HPI: Mary Herman is a 63 y.o. female who returns for follow up appointment for chronic pain and medication refill. She states her pain is located in her lower back and bilateral knee pain L>R. She rates her pain 3. Her current exercise regime is walking and performing stretching exercises.  [name] Morphine equivalent is 10.00 MME. She  is also prescribed Diazepam   by Dr. Perri .We have discussed the black box warning of using opioids and benzodiazepines. I highlighted the dangers of using these drugs together and discussed the adverse events including respiratory suppression, overdose, cognitive impairment and importance of compliance with current regimen. We will continue to monitor and adjust as indicated.   Last Oral Swab was Performed on 11/30/2023, it was consistent.    Pain Inventory Average Pain 3 Pain Right Now 3 My pain is sharp, dull, stabbing, and aching  In the last 24 hours, has pain interfered with the following? General activity 2 Relation with others 2 Enjoyment of life 2 What TIME of day is your pain at its worst? daytime Sleep (in general) Fair  Pain is worse with: walking, standing, and some activites Pain improves with: rest, medication, and injections Relief from Meds: 7  Family History  Problem Relation Age of Onset   Colon polyps Mother    Hypertension Mother    Stroke Mother    Hypertension Brother    Heart disease Maternal Uncle    Diabetes Maternal Grandmother    Cancer Maternal Grandmother        THROAT CA   Cancer Maternal Grandfather        THROAT CA   Esophageal cancer Paternal Grandmother    Colon cancer Neg Hx    Rectal cancer Neg Hx    Stomach cancer Neg Hx    Social History   Socioeconomic History   Marital status: Widowed    Spouse name: Not on file   Number of children: Not on file   Years of education: Not on file    Highest education level: 12th grade  Occupational History   Not on file  Tobacco Use   Smoking status: Never    Passive exposure: Past   Smokeless tobacco: Never  Vaping Use   Vaping status: Never Used  Substance and Sexual Activity   Alcohol use: No   Drug use: No   Sexual activity: Not Currently    Partners: Male    Birth control/protection: Surgical    Comment: Hysterectomy; husband vasectomy-1st intercourse 26 yo-Fewer than 5 partners  Other Topics Concern   Not on file  Social History Narrative   Not on file   Social Drivers of Health   Financial Resource Strain: Low Risk  (08/05/2023)   Overall Financial Resource Strain (CARDIA)    Difficulty of Paying Living Expenses: Not very hard  Food Insecurity: No Food Insecurity (08/05/2023)   Hunger Vital Sign    Worried About Running Out of Food in the Last Year: Never true    Ran Out of Food in the Last Year: Never true  Transportation Needs: No Transportation Needs (08/05/2023)   PRAPARE - Administrator, Civil Service (Medical): No    Lack of Transportation (Non-Medical): No  Physical Activity: Inactive (08/05/2023)   Exercise Vital Sign    Days of Exercise per Week: 0 days    Minutes of  Exercise per Session: Not on file  Stress: Stress Concern Present (08/05/2023)   [employer] of Occupational Health - Occupational Stress Questionnaire    Feeling of Stress: To some extent  Social Connections: Socially Isolated (08/05/2023)   Social Connection and Isolation Panel    Frequency of Communication with Friends and Family: More than three times a week    Frequency of Social Gatherings with Friends and Family: Once a week    Attends Religious Services: Patient declined    Database Administrator or Organizations: No    Attends Engineer, Structural: Not on file    Marital Status: Widowed   Past Surgical History:  Procedure Laterality Date   CARDIAC CATHETERIZATION  12-07-2006  [facility]   NORMAL  CORONARIES ARTERIES/ NORMAL LVF/ MILD INCREASED END-DIASTOLIC PRESSURE   CESAREAN SECTION  1998   CHOLECYSTECTOMY  1980'S   CYSTOSCOPY WITH RETROGRADE PYELOGRAM, URETEROSCOPY AND STENT PLACEMENT Bilateral 12/26/2012   Procedure: cystoscopy with bilateral retrogrades, bilateral ureteroscopy ;  Surgeon: Alm GORMAN Fragmin, MD;  Location: [facility];  Service: Urology;  Laterality: Bilateral;   CYSTOSCOPY WITH STENT PLACEMENT Bilateral 12/26/2012   Procedure: CYSTOSCOPY WITH STENT PLACEMENT;  Surgeon: Alm GORMAN Fragmin, MD;  Location[facility];  Service: Urology;  Laterality: Bilateral;   CYSTOSCOPY/ HYDRODISTENTION  YRS AGO   DILATATION & CURETTAGE/HYSTEROSCOPY WITH MYOSURE N/A 11/25/2018   Procedure: DILATATION & CURETTAGE/HYSTEROSCOPY WITH MYOSURE;  Surgeon: Rockney Evalene SQUIBB, MD;  Location: [facility];  Service: Gynecology;  Laterality: N/A;   ETHMOIDECTOMY Right 09/25/2022   Procedure: RIGHT ANTERIOR ETHMOIDECTOMY;  Surgeon: Llewellyn Gerard LABOR, DO;  Location: [facility];  Service: ENT;  Laterality: Right;   HERNIA REPAIR     HOLMIUM LASER APPLICATION Right 12/26/2012   Procedure: HOLMIUM LASER APPLICATION;  Surgeon: Alm GORMAN Fragmin, MD;  Location: [facility];  Service: Urology;  Laterality: Right;   KNEE ARTHROSCOPY Left 1992   MAXILLARY ANTROSTOMY Right 09/25/2022   Procedure: RIGHT MAXILLARY ANTROSTOMY;  Surgeon: Llewellyn Gerard LABOR, DO;  Location: [facility];  Service: ENT;  Laterality: Right;   NASAL TURBINATE REDUCTION Bilateral 09/25/2022   Procedure: BILATERAL INFERIOR TURBINATE REDUCTION/SUBMUCOSAL RESECTION;  Surgeon: Llewellyn Gerard LABOR, DO;  Location: [facility];  Service: ENT;  Laterality: Bilateral;   ROBOTIC ASSISTED TOTAL HYSTERECTOMY WITH BILATERAL SALPINGO OOPHERECTOMY N/A 01/31/2019   Procedure: XI ROBOTIC ASSISTED TOTAL HYSTERECTOMY WITH BILATERAL SALPINGO OOPHORECTOMY;  Surgeon: Eloy Herring, MD;  Location: [facility];  Service: Gynecology;  Laterality: N/A;    ROUX-EN-Y PROCEDURE  07/25/2007   SHOULDER ARTHROSCOPY Right 2011   SINUS ENDO WITH FUSION Right 09/25/2022   Procedure: FUNCTIONAL ENDOSCOPIC SINUS WITH FUSION;  Surgeon: Llewellyn Gerard LABOR, DO;  Location: [facility];  Service: ENT;  Laterality: Right;   TMJ ARTHROPLASTY  1990'S   BILATERAL UPPER   TOTAL ABDOMINAL HYSTERECTOMY  01/31/2019   Past Surgical History:  Procedure Laterality Date   CARDIAC CATHETERIZATION  12-07-2006  [facility]   NORMAL CORONARIES ARTERIES/ NORMAL LVF/ MILD INCREASED END-DIASTOLIC PRESSURE   CESAREAN SECTION  1998   CHOLECYSTECTOMY  1980'S   CYSTOSCOPY WITH RETROGRADE PYELOGRAM, URETEROSCOPY AND STENT PLACEMENT Bilateral 12/26/2012   Procedure: cystoscopy with bilateral retrogrades, bilateral ureteroscopy ;  Surgeon: Alm GORMAN Fragmin, MD;  Location: [facility];  Service: Urology;  Laterality: Bilateral;   CYSTOSCOPY WITH STENT PLACEMENT Bilateral 12/26/2012   Procedure: CYSTOSCOPY WITH STENT PLACEMENT;  Surgeon: Alm GORMAN Fragmin, MD;  Location: [facility];  Service: Urology;  Laterality: Bilateral;   CYSTOSCOPY/ HYDRODISTENTION  YRS AGO   DILATATION & CURETTAGE/HYSTEROSCOPY WITH MYOSURE N/A 11/25/2018   Procedure: DILATATION & CURETTAGE/HYSTEROSCOPY WITH MYOSURE;  Surgeon: Rockney Evalene SQUIBB, MD;  [facility];  Service: Gynecology;  Laterality: N/A;   ETHMOIDECTOMY Right 09/25/2022   Procedure: RIGHT ANTERIOR ETHMOIDECTOMY;  Surgeon: Llewellyn Gerard LABOR, DO;  [facility];  Service: ENT;  Laterality: Right;   HERNIA REPAIR     HOLMIUM LASER APPLICATION Right 12/26/2012   Procedure: HOLMIUM LASER APPLICATION;  Surgeon: Alm GORMAN Fragmin, MD;  Location[facility];  Service: Urology;  Laterality: Right;   KNEE ARTHROSCOPY Left 1992   MAXILLARY ANTROSTOMY Right 09/25/2022   Procedure: RIGHT MAXILLARY ANTROSTOMY;  Surgeon: Llewellyn Gerard LABOR, DO;  [facility];  Service: ENT;  Laterality: Right;   NASAL TURBINATE  REDUCTION Bilateral 09/25/2022   Procedure: BILATERAL INFERIOR TURBINATE REDUCTION/SUBMUCOSAL RESECTION;  Surgeon: Llewellyn Gerard LABOR, DO;  [facility];  Service: ENT;  Laterality: Bilateral;   ROBOTIC ASSISTED TOTAL HYSTERECTOMY WITH BILATERAL SALPINGO OOPHERECTOMY N/A 01/31/2019   Procedure: XI ROBOTIC ASSISTED TOTAL HYSTERECTOMY WITH BILATERAL SALPINGO OOPHORECTOMY;  Surgeon: Eloy Herring, MD;  Location: [facility];  Service: Gynecology;  Laterality: N/A;   ROUX-EN-Y PROCEDURE  07/25/2007   SHOULDER ARTHROSCOPY Right 2011   SINUS ENDO WITH FUSION Right 09/25/2022   Procedure: FUNCTIONAL ENDOSCOPIC SINUS WITH FUSION;  Surgeon: Llewellyn Gerard LABOR, DO;  [facility];  Service: ENT;  Laterality: Right;   TMJ ARTHROPLASTY  1990'S   BILATERAL UPPER   TOTAL ABDOMINAL HYSTERECTOMY  01/31/2019   Past Medical History:  Diagnosis Date   Anemia    Chronic back pain    Depression    Diabetes mellitus without complication (HCC)    takes on "sundays - semaglutide   Dyspnea    with activity   Environmental allergies    Family history of adverse reaction to anesthesia    daughter- n/v       GERD (gastroesophageal reflux disease)    Hematuria    History of cystitis    INTERSTITIAL CYSTITIS--  NO ISSUES FOR YRS   History of gastroesophageal reflux (GERD)    DENIES ISSUES SINCE GASTRIC BY PASS   History of hypertension    NO ISSUE SINCE WT LOSS AFTER GASTRIC BYPASS   History of kidney stones    surgery to remove   History of obstructive sleep apnea    USED CPAP--  NO ISSUE SINCE WT LOSS AFTER GASTRIC BYPASS   Hypertension    Hypothyroidism    Pneumonia    x 1   Pre-diabetes 06/2021   Rheumatoid arthritis (HCC)    Right ureteral stone    Sleep apnea    does not use cpap   Trochanteric bursitis of both hips    Tuberculosis    patient denies this dx as of 09/09/22   BP 135/86 (BP Location: Left Arm, Patient Position: Sitting, Cuff Size: Normal)   Pulse 89   Ht 5' 3 (1.6 m)   Wt 164 lb  3.2 oz (74.5 kg)   LMP 05/25/2014   SpO2 98%   BMI 29.09 kg/m   Opioid Risk Score:   Fall Risk Score:  `1  Depression screen PHQ 2/9     11" /05/2023    9:55 AM 11/30/2023   12:49 PM 09/14/2023   11:24 AM 05/21/2023   11:28 AM 05/15/2022   10:00 AM 11/20/2021    9:33  AM 05/13/2021   10:01 AM  Depression screen PHQ 2/9  Decreased Interest 0 0 0 2 0 0 0  Down, Depressed, Hopeless 0 0 2 1 0 0 0  PHQ - 2 Score 0 0 2 3 0 0 0  Altered sleeping 0 1 1 1      Tired, decreased energy 0 1 2 2      Change in appetite 0 0 2 2     Feeling bad or failure about yourself  0 0 0 2     Trouble concentrating 0 0 1 2     Moving slowly or fidgety/restless 0 0 0 0     Suicidal thoughts 0 0 0 0     PHQ-9 Score 0 2 8 12      Difficult doing work/chores   Not difficult at all          Review of Systems  Musculoskeletal:  Positive for arthralgias, back pain and myalgias.       Bilateral wrist and hand pain, bilateral knee pain, low back pain  All other systems reviewed and are negative.      Objective:   Physical Exam Vitals and nursing note reviewed.  Constitutional:      Appearance: Normal appearance.  Cardiovascular:     Rate and Rhythm: Normal rate and regular rhythm.     Pulses: Normal pulses.     Heart sounds: Normal heart sounds.  Pulmonary:     Effort: Pulmonary effort is normal.     Breath sounds: Normal breath sounds.  Musculoskeletal:     Comments: Normal Muscle Bulk and Muscle Testing Reveals:  Upper Extremities: Full ROM and Muscle Strength 5/5  Lumbar Paraspinal Tenderness: L-1 L-2 Lower Extremities: Full ROM and Muscle Strength 5/5 Arises from chair with ease Narrow Based  Gait     Skin:    General: Skin is warm and dry.  Neurological:     Mental Status: She is alert and oriented to person, place, and time.  Psychiatric:        Mood and Affect: Mood normal.        Behavior: Behavior normal.          Assessment & Plan:  Chronic Bilateral Low Back Pain without  Sciatica: Continue HEP as Tolerated. Continue to Monitor.  Bilateral Chronic Knee Pain: Continue HEP as Tolerated. Continue to Monitor.  Chronic Pain Syndrome: Refilled: Hydrocodone  5/325 mg one tablet twice a day as needed for pain #60. SABRA We will continue the opioid monitoring program, this consists of regular clinic visits, examinations, urine drug screen, pill counts as well as use of [place]  Controlled Substance Reporting system. A 12 month History has been reviewed on the   Controlled Substance Reporting System on 12/28/2023.   F/U with Dr Lorilee

## 2023-12-29 ENCOUNTER — Other Ambulatory Visit (HOSPITAL_COMMUNITY): Payer: Self-pay

## 2023-12-30 ENCOUNTER — Ambulatory Visit: Admitting: Orthopaedic Surgery

## 2023-12-30 DIAGNOSIS — G8929 Other chronic pain: Secondary | ICD-10-CM

## 2023-12-30 DIAGNOSIS — M25561 Pain in right knee: Secondary | ICD-10-CM

## 2023-12-30 DIAGNOSIS — M25562 Pain in left knee: Secondary | ICD-10-CM

## 2023-12-30 MED ORDER — METHYLPREDNISOLONE ACETATE 40 MG/ML IJ SUSP
40.0000 mg | INTRAMUSCULAR | Status: AC | PRN
  Administered 2023-12-30: 40 mg via INTRA_ARTICULAR

## 2023-12-30 MED ORDER — LIDOCAINE HCL 1 % IJ SOLN
3.0000 mL | INTRAMUSCULAR | Status: AC | PRN
  Administered 2023-12-30: 3 mL

## 2023-12-30 NOTE — Progress Notes (Signed)
 The patient is very well-known to us .  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  She is only 63 years old.  She is now lost over 100 pounds and her BMI is down to 29.09.  We injected her knees with steroids about every 3 months.  Her hemoglobin A1c is now below 4.  She would like to have steroid injections again in her knees today.  She is not ready for knee replacement surgery.  She does have a grandchild coming in January and she needs to be able to help out her daughter.  On exam both knees have significant varus malalignment and significant pain throughout the arc of motion with medial and lateral joint line tenderness and patellofemoral crepitation.  We did place a steroid injection in both knees today which she tolerated very well.  We can repeat these in 3 months.    Procedure Note  Patient: Mary Herman             Date of Birth: [birth year]           MRN: [id number]             Visit Date: 12/30/2023  Procedures: Visit Diagnoses:  1. Chronic pain of right knee   2. Chronic pain of left knee     Large Joint Inj: R knee on 12/30/2023 1:08 PM Indications: diagnostic evaluation and pain Details: 22 G 1.5 in needle, superolateral approach  Arthrogram: No  Medications: 3 mL lidocaine  1 %; 40 mg methylPREDNISolone  acetate 40 MG/ML Outcome: tolerated well, no immediate complications Procedure, treatment alternatives, risks and benefits explained, specific risks discussed. Consent was given by the patient. Immediately prior to procedure a time out was called to verify the correct patient, procedure, equipment, support staff and site/side marked as required. Patient was prepped and draped in the usual sterile fashion.    Large Joint Inj: L knee on 12/30/2023 1:08 PM Indications: diagnostic evaluation and pain Details: 22 G 1.5 in needle, superolateral approach  Arthrogram: No  Medications: 3 mL lidocaine  1 %; 40 mg methylPREDNISolone  acetate 40 MG/ML Outcome:  tolerated well, no immediate complications Procedure, treatment alternatives, risks and benefits explained, specific risks discussed. Consent was given by the patient. Immediately prior to procedure a time out was called to verify the correct patient, procedure, equipment, support staff and site/side marked as required. Patient was prepped and draped in the usual sterile fashion.

## 2024-01-03 ENCOUNTER — Other Ambulatory Visit (HOSPITAL_COMMUNITY): Payer: Self-pay

## 2024-01-03 ENCOUNTER — Telehealth: Payer: Self-pay | Admitting: Pharmacy Technician

## 2024-01-03 NOTE — Telephone Encounter (Signed)
    But patient has medicaid and [facility]. scanned in media

## 2024-01-05 ENCOUNTER — Other Ambulatory Visit (HOSPITAL_COMMUNITY): Payer: Self-pay

## 2024-01-10 ENCOUNTER — Ambulatory Visit (INDEPENDENT_AMBULATORY_CARE_PROVIDER_SITE_OTHER): Admitting: Licensed Clinical Social Worker

## 2024-01-10 ENCOUNTER — Other Ambulatory Visit (HOSPITAL_COMMUNITY): Payer: Self-pay

## 2024-01-10 DIAGNOSIS — F331 Major depressive disorder, recurrent, moderate: Secondary | ICD-10-CM | POA: Diagnosis not present

## 2024-01-10 DIAGNOSIS — F411 Generalized anxiety disorder: Secondary | ICD-10-CM | POA: Diagnosis not present

## 2024-01-10 NOTE — Progress Notes (Signed)
 [facility] Counselor/Therapist Progress Note  Patient ID: Mary Herman, MRN: [id number]    Date: 01/10/24  Time Spent: 1103   am - 1200 am : 57 Minutes  Treatment Type: Individual Therapy.  Reported Symptoms: Spouse of 30 years passed in February. She reports that she lacks a social support network. Patient reports that he had been ill and disabled. He began passing out and he ended up having a heart attack. Patient reports symptoms of depression and anxiety. Lost job in December.    Mental Status Exam: Appearance:  Casual     Behavior: Appropriate  Motor: Normal  Speech/Language:  Clear and Coherent  Affect: Appropriate  Mood: normal  Thought process: normal  Thought content:   WNL  Sensory/Perceptual disturbances:   WNL  Orientation: oriented to person, place, time/date, situation, day of week, month of year, and year  Attention: Good  Concentration: Good  Memory: WNL  Fund of knowledge:  Good  Insight:   Good  Judgment:  Good  Impulse Control: Good    Risk Assessment: Danger to Self:  No Self-injurious Behavior: No Danger to Others: No Duty to Warn:no Physical Aggression / Violence:No  Access to Firearms a concern: No  Gang Involvement:No    Subjective:    Nathanel JONETTA Ku participated in person from office,located at [employer].   Annsley consented to treatment. Therapist participated from office.    Zienna presented for her session in a frustrated state of mind. She states she is scared about the recent ICE raids in [place]. She states that she has an anxiety. She states that she finds herself dwelling on the political issues. She states that she has also found that she is increasingly depressed. She states that she finds she is trying to be more aware of the news and what is happening. She states she has been stocking up so that if she feels unsafe she will have what she needs. She states that she feels the feelings of depression come and go  based on what is happening. She does state that she is trying to get out of the house every couple of days.  Clinician actively listened and processed with patient her concerns. Clinician and patient discussed that although things can be scary, we also have a lot of good things to be thankful for. We processed trying to be positive. To be more positive, practice gratitude, surround yourself with uplifting people, and use positive self-talk or affirmations to reframe negative thoughts. Other effective methods include maintaining a healthy lifestyle through exercise and self-care, engaging in activities you enjoy, and focusing on solutions and small wins instead of dwelling on problems.   Samora was fully engaged in discussion with Clinician and was motivated for treatment. Aminta is open to feedback and eager to gain insight into her grief and ways to move forward and build a new normal. Patient is to use CBT, mindfulness and coping skills to help manage decrease symptoms associated with their diagnosis. Treatment planning to be reviewed by 10/12/2024.     Interventions: Cognitive Behavioral Therapy, Dialectical Behavioral Therapy, Mindfulness Meditation, Motivational Interviewing, Solution-Oriented/Positive Psychology, and Grief Therapy   Diagnosis: Major Depressive Disorder, recurrent moderate, Generalized Anxiety Disorder      Damien Junk MSW, LCSW/DATE 01/10/2024

## 2024-01-24 ENCOUNTER — Telehealth: Payer: Self-pay

## 2024-01-24 ENCOUNTER — Telehealth: Payer: Self-pay | Admitting: Cardiology

## 2024-01-24 ENCOUNTER — Telehealth: Payer: Self-pay | Admitting: Internal Medicine

## 2024-01-24 NOTE — Telephone Encounter (Signed)
 Pt called in and stated that her pcp is not going to do her weight loss med.  She would like to know if we can do it for another year?     Best number  [phone]

## 2024-01-24 NOTE — Telephone Encounter (Signed)
 Routed to Dr. Amadeo team   Per chart review from PCP office: Mary Herman HERO to [facility]   01/24/24  2:56 PM Received patient message regarding Ozempic . LM that Dr. Perri does not prescribe ozempic  or any weight loss medications. Gave info for [facility] to contact for questions .

## 2024-01-24 NOTE — Telephone Encounter (Signed)
 Copied from CRM #[id number]. Topic: Clinical - Medication Question >> Jan 24, 2024 11:17 AM Rosaria BRAVO wrote: Reason for CRM: Questions for the nurse regarding ozempic   Best contact: [phone]

## 2024-01-25 ENCOUNTER — Ambulatory Visit: Admitting: Licensed Clinical Social Worker

## 2024-01-25 ENCOUNTER — Telehealth: Payer: Self-pay

## 2024-01-25 NOTE — Telephone Encounter (Signed)
 Outcome Denied on November 25 by [facility] Medicaid 2017 NCPDP Request Reference Number: [id number]. QUTENZA KIT 8% 1-PCH is denied for not meeting the prior authorization requirement(s)  Denial letter in your box.

## 2024-01-27 ENCOUNTER — Encounter: Payer: Self-pay | Admitting: Physical Medicine and Rehabilitation

## 2024-01-27 ENCOUNTER — Other Ambulatory Visit (HOSPITAL_COMMUNITY): Payer: Self-pay

## 2024-01-27 ENCOUNTER — Encounter: Admitting: Physical Medicine and Rehabilitation

## 2024-01-27 VITALS — BP 115/84 | HR 96 | Ht 63.0 in | Wt 164.0 lb

## 2024-01-27 DIAGNOSIS — G8929 Other chronic pain: Secondary | ICD-10-CM | POA: Diagnosis present

## 2024-01-27 DIAGNOSIS — M05741 Rheumatoid arthritis with rheumatoid factor of right hand without organ or systems involvement: Secondary | ICD-10-CM | POA: Diagnosis not present

## 2024-01-27 DIAGNOSIS — M25561 Pain in right knee: Secondary | ICD-10-CM | POA: Insufficient documentation

## 2024-01-27 DIAGNOSIS — M25562 Pain in left knee: Secondary | ICD-10-CM | POA: Diagnosis not present

## 2024-01-27 DIAGNOSIS — M05742 Rheumatoid arthritis with rheumatoid factor of left hand without organ or systems involvement: Secondary | ICD-10-CM | POA: Insufficient documentation

## 2024-01-27 DIAGNOSIS — M545 Low back pain, unspecified: Secondary | ICD-10-CM | POA: Insufficient documentation

## 2024-01-27 DIAGNOSIS — G894 Chronic pain syndrome: Secondary | ICD-10-CM | POA: Insufficient documentation

## 2024-01-27 MED ORDER — HYDROCODONE-ACETAMINOPHEN 5-325 MG PO TABS
1.0000 | ORAL_TABLET | Freq: Two times a day (BID) | ORAL | 0 refills | Status: AC | PRN
  Filled 2024-01-27 – 2024-02-08 (×2): qty 60, 30d supply, fill #0

## 2024-01-27 NOTE — Progress Notes (Signed)
 Subjective:    Patient ID: Mary Herman, female    DOB: [birth year], 63 y.o.   MRN: [id number]   [name] is a 32 year ols woman who presents to establish care for chronic low back pain.  1) Chronic bilateral knee pain: -she has been able to keep -she has both RW or OA -she has two knees that need to be replaced -she takes hydrocodone  and this provides relief- she takes at least one per day -her left knee twerked and she was in extreme pain fro some time -she was receiving care at [facility] -she gets steroid injections from Dr. Vernetta  2) Bilateral hand RA:  -she is on Humera -has improved with steroids   Pain Inventory Average Pain 4 Pain Right Now 4 My pain is intermittent, sharp, burning, dull, stabbing, tingling, and aching  In the last 24 hours, has pain interfered with the following? General activity 3 Relation with others 0 Enjoyment of life 6 What TIME of day is your pain at its worst? morning  Sleep (in general) Good  Pain is worse with: walking, bending, sitting, inactivity, standing, and some activites Pain improves with: rest and medication Relief from Meds: 10   Family History  Problem Relation Age of Onset   Colon polyps Mother    Hypertension Mother    Stroke Mother    Hypertension Brother    Heart disease Maternal Uncle    Diabetes Maternal Grandmother    Cancer Maternal Grandmother        THROAT CA   Cancer Maternal Grandfather        THROAT CA   Esophageal cancer Paternal Grandmother    Colon cancer Neg Hx    Rectal cancer Neg Hx    Stomach cancer Neg Hx    Social History   Socioeconomic History   Marital status: Widowed    Spouse name: Not on file   Number of children: Not on file   Years of education: Not on file   Highest education level: 12th grade  Occupational History   Not on file  Tobacco Use   Smoking status: Never    Passive exposure: Past   Smokeless tobacco: Never  Vaping Use   Vaping  status: Never Used  Substance and Sexual Activity   Alcohol use: No   Drug use: No   Sexual activity: Not Currently    Partners: Male    Birth control/protection: Surgical    Comment: Hysterectomy; husband vasectomy-1st intercourse 26 yo-Fewer than 5 partners  Other Topics Concern   Not on file  Social History Narrative   Not on file   Social Drivers of Health   Financial Resource Strain: Low Risk  (08/05/2023)   Overall Financial Resource Strain (CARDIA)    Difficulty of Paying Living Expenses: Not very hard  Food Insecurity: No Food Insecurity (08/05/2023)   Hunger Vital Sign    Worried About Running Out of Food in the Last Year: Never true    Ran Out of Food in the Last Year: Never true  Transportation Needs: No Transportation Needs (08/05/2023)   PRAPARE - Administrator, Civil Service (Medical): No    Lack of Transportation (Non-Medical): No  Physical Activity: Inactive (08/05/2023)   Exercise Vital Sign    Days of Exercise per Week: 0 days    Minutes of Exercise per Session: Not on file  Stress: Stress Concern Present (08/05/2023)   [employer] of Occupational Health - Occupational  Stress Questionnaire    Feeling of Stress: To some extent  Social Connections: Socially Isolated (08/05/2023)   Social Connection and Isolation Panel    Frequency of Communication with Friends and Family: More than three times a week    Frequency of Social Gatherings with Friends and Family: Once a week    Attends Religious Services: Patient declined    Database Administrator or Organizations: No    Attends Engineer, Structural: Not on file    Marital Status: Widowed   Past Surgical History:  Procedure Laterality Date   CARDIAC CATHETERIZATION  12-07-2006  [facility]   NORMAL CORONARIES ARTERIES/ NORMAL LVF/ MILD INCREASED END-DIASTOLIC PRESSURE   CESAREAN SECTION  1998   CHOLECYSTECTOMY  1980'S   CYSTOSCOPY WITH RETROGRADE PYELOGRAM, URETEROSCOPY AND STENT  PLACEMENT Bilateral 12/26/2012   Procedure: cystoscopy with bilateral retrogrades, bilateral ureteroscopy ;  Surgeon: Alm GORMAN Fragmin, MD;  Location: [facility];  Service: Urology;  Laterality: Bilateral;   CYSTOSCOPY WITH STENT PLACEMENT Bilateral 12/26/2012   Procedure: CYSTOSCOPY WITH STENT PLACEMENT;  Surgeon: Alm GORMAN Fragmin, MD;  Location: [facility];  Service: Urology;  Laterality: Bilateral;   CYSTOSCOPY/ HYDRODISTENTION  YRS AGO   DILATATION & CURETTAGE/HYSTEROSCOPY WITH MYOSURE N/A 11/25/2018   Procedure: DILATATION & CURETTAGE/HYSTEROSCOPY WITH MYOSURE;  Surgeon: Rockney Evalene SQUIBB, MD;  Location: [facility];  Service: Gynecology;  Laterality: N/A;   ETHMOIDECTOMY Right 09/25/2022   Procedure: RIGHT ANTERIOR ETHMOIDECTOMY;  Surgeon: Llewellyn Gerard LABOR, DO;  Location: [facility];  Service: ENT;  Laterality: Right;   HERNIA REPAIR     HOLMIUM LASER APPLICATION Right 12/26/2012   Procedure: HOLMIUM LASER APPLICATION;  Surgeon: Alm GORMAN Fragmin, MD;  Location: [facility];  Service: Urology;  Laterality: Right;   KNEE ARTHROSCOPY Left 1992   MAXILLARY ANTROSTOMY Right 09/25/2022   Procedure: RIGHT MAXILLARY ANTROSTOMY;  Surgeon: Llewellyn Gerard LABOR, DO;  Location: [facility];  Service: ENT;  Laterality: Right;   NASAL TURBINATE REDUCTION Bilateral 09/25/2022   Procedure: BILATERAL INFERIOR TURBINATE REDUCTION/SUBMUCOSAL RESECTION;  Surgeon: Llewellyn Gerard LABOR, DO;  Location: [facility];  Service: ENT;  Laterality: Bilateral;   ROBOTIC ASSISTED TOTAL HYSTERECTOMY WITH BILATERAL SALPINGO OOPHERECTOMY N/A 01/31/2019   Procedure: XI ROBOTIC ASSISTED TOTAL HYSTERECTOMY WITH BILATERAL SALPINGO OOPHORECTOMY;  Surgeon: Eloy Herring, MD;  Location: [facility];  Service: Gynecology;  Laterality: N/A;   ROUX-EN-Y PROCEDURE  07/25/2007   SHOULDER ARTHROSCOPY Right 2011   SINUS ENDO WITH FUSION Right 09/25/2022   Procedure: FUNCTIONAL ENDOSCOPIC SINUS WITH FUSION;  Surgeon: Llewellyn Gerard LABOR, DO;  Location: [facility];  Service: ENT;  Laterality: Right;   TMJ ARTHROPLASTY  1990'S   BILATERAL UPPER   TOTAL ABDOMINAL HYSTERECTOMY  01/31/2019   Past Medical History:  Diagnosis Date   Anemia    Chronic back pain    Depression    Diabetes mellitus without complication (HCC)    takes on sundays - semaglutide    Dyspnea    with activity   Environmental allergies    Family history of adverse reaction to anesthesia    daughter- n/v       GERD (gastroesophageal reflux disease)    Hematuria    History of cystitis    INTERSTITIAL CYSTITIS--  NO ISSUES FOR YRS   History of gastroesophageal reflux (GERD)    DENIES ISSUES SINCE GASTRIC BY PASS   History of hypertension    NO ISSUE SINCE WT LOSS AFTER GASTRIC BYPASS  History of kidney stones    surgery to remove   History of obstructive sleep apnea    USED CPAP--  NO ISSUE SINCE WT LOSS AFTER GASTRIC BYPASS   Hypertension    Hypothyroidism    Pneumonia    x 1   Pre-diabetes 06/2021   Rheumatoid arthritis (HCC)    Right ureteral stone    Sleep apnea    does not use cpap   Trochanteric bursitis of both hips    Tuberculosis    patient denies this dx as of 09/09/22   BP 115/84   Pulse 96   Ht 5' 3 (1.6 m)   Wt 164 lb (74.4 kg)   LMP 05/25/2014   SpO2 98%   BMI 29.05 kg/m   Opioid Risk Score:   Fall Risk Score:  `1  Depression screen [facility] 2/9     01/27/2024   10:20 AM 12/28/2023    9:55 AM 11/30/2023   12:49 PM 09/14/2023   11:24 AM 05/21/2023   11:28 AM 05/15/2022   10:00 AM 11/20/2021    9:33 AM  Depression screen PHQ 2/9  Decreased Interest 1 0 0 0 2 0 0  Down, Depressed, Hopeless 1 0 0 2 1 0 0  PHQ - 2 Score 2 0 0 2 3 0 0  Altered sleeping  0 1 1 1     Tired, decreased energy  0 1 2 2     Change in appetite  0 0 2 2    Feeling bad or failure about yourself   0 0 0 2    Trouble concentrating  0 0 1 2    Moving slowly or fidgety/restless  0 0 0 0    Suicidal thoughts  0 0 0 0    PHQ-9 Score  0  2  8  12       Difficult doing work/chores    Not difficult at all        Data saved with a previous flowsheet row definition    Review of Systems  Musculoskeletal:  Positive for back pain.       Pain in both wrist, hands, both knees  All other systems reviewed and are negative.      Objective:   Physical Exam Gen: no distress, normal appearing HEENT: oral mucosa pink and moist, NCAT Cardio: Reg rate Chest: normal effort, normal rate of breathing Abd: soft, non-distended Ext: no edema Psych: pleasant, normal affect Skin: intact Neuro: Alert and oriented x3 MSK: left knee is TTP, stable 12/4       Assessment & Plan:  1) Chronic Pain Syndrome secondary to bilateral knee OA -refilled hydrocodone  -discussed that pain has been controlled to level of 3-4 with BID dosing  -discussed mechanism of action of low dose naltrexone as an opioid receptor antagonist which stimulates your body's production of its own natural endogenous opioids, helping to decrease pain. Discussed that it can also decrease T cell response and thus be helpful in decreasing inflammation, and symptoms of brain fog, fatigue, anxiety, depression, and allergies. Discussed that this medication needs to be compounded at a compounding pharmacy and can more expensive. Discussed that I usually start at 1mg  and if this is not providing enough relief then I titrate upward on a monthly basis.    -Discussed current symptoms of pain and history of pain.  -Discussed benefits of exercise in reducing pain. -UDS and pain contract performed, discussed that if contains expected metabolites will prescribe hydrocodone  5mg  BID  prn -Discussed following foods that may reduce pain: 1) Ginger (especially studied for arthritis)- reduce leukotriene production to decrease inflammation 2) Blueberries- high in phytonutrients that decrease inflammation 3) Salmon- marine omega-3s reduce joint swelling and pain 4) Pumpkin seeds- reduce inflammation 5)  dark chocolate- reduces inflammation 6) turmeric- reduces inflammation 7) tart cherries - reduce pain and stiffness 8) extra virgin olive oil - its compound olecanthal helps to block prostaglandins  9) chili peppers- can be eaten or applied topically via capsaicin 10) mint- helpful for headache, muscle aches, joint pain, and itching 11) garlic- reduces inflammation 12) Green tea- reduces inflammation and oxidative stress, helps with weight loss, may reduce the risk of cancer, recommend Double Green Matcha [place] of Tea daily  Link to further information on diet for chronic pain: http://www.bray.com/    2) Low back pain: -Discussed Qutenza as an option for neuropathic pain control. Discussed that this is a capsaicin patch, stronger than capsaicin cream. Discussed that it is currently approved for diabetic peripheral neuropathy and post-herpetic neuralgia, but that it has also shown benefit in treating other forms of neuropathy. Provided patient with link to site to learn more about the patch: https://www.clark.biz/. Discussed that the patch would be placed in office and benefits usually last 3 months. Discussed that unintended exposure to capsaicin can cause severe irritation of eyes, mucous membranes, respiratory tract, and skin, but that Qutenza is a local treatment and does not have the systemic side effects of other nerve medications. Discussed that there may be pain, itching, erythema, and decreased sensory function associated with the application of Qutenza. Side effects usually subside within 1 week. A cold pack of analgesic medications can help with these side effects. Blood pressure can also be increased due to pain associated with administration of the patch.  We are recommending Qutenza 8% capsaicin to treat this patient's pain. Qutenza is the first-line treatment option recommended for diabetic peripheral neuropathy  by the AACE and ADA.   Qutenza is a safer option for this patient due to the following: Gabapentin  use has been associated with increased risk of dementia Lyrica use has been associated with increased risk of heart failure Cymblata, Venlafaxine, and other SSRIs/SNRIs are associated with increased weight gain    3) RA bilateral hands: -continue voltaren gel  -discussed mechanism of action of low dose naltrexone as an opioid receptor antagonist which stimulates your body's production of its own natural endogenous opioids, helping to decrease pain. Discussed that it can also decrease T cell response and thus be helpful in decreasing inflammation, and symptoms of brain fog, fatigue, anxiety, depression, and allergies. Discussed that this medication needs to be compounded at a compounding pharmacy and can more expensive. Discussed that I usually start at 1mg  and if this is not providing enough relief then I titrate upward on a monthly basis.

## 2024-01-27 NOTE — Patient Instructions (Signed)
 Boswellia serrata

## 2024-02-07 ENCOUNTER — Other Ambulatory Visit: Payer: Self-pay

## 2024-02-07 ENCOUNTER — Ambulatory Visit: Admitting: Licensed Clinical Social Worker

## 2024-02-07 ENCOUNTER — Telehealth: Payer: Self-pay

## 2024-02-07 ENCOUNTER — Other Ambulatory Visit (HOSPITAL_COMMUNITY): Payer: Self-pay

## 2024-02-07 ENCOUNTER — Encounter (HOSPITAL_COMMUNITY): Payer: Self-pay

## 2024-02-07 MED ORDER — HUMIRA (2 PEN) 40 MG/0.4ML ~~LOC~~ AJKT
AUTO-INJECTOR | SUBCUTANEOUS | 3 refills | Status: AC
  Filled 2024-02-08 (×2): qty 0.8, 28d supply, fill #0
  Filled 2024-03-01: qty 0.8, 28d supply, fill #1

## 2024-02-07 MED ORDER — HUMIRA (2 PEN) 40 MG/0.4ML ~~LOC~~ AJKT
AUTO-INJECTOR | SUBCUTANEOUS | 3 refills | Status: DC
  Filled 2024-02-07: qty 0.8, 28d supply, fill #0

## 2024-02-07 NOTE — Progress Notes (Signed)
 PA required.

## 2024-02-07 NOTE — Telephone Encounter (Signed)
 Pharmacy Patient Advocate Encounter   Received notification from Pt Calls Messages that prior authorization for Humira  is required/requested.   Insurance verification completed.   The patient is insured through [facility] MEDICAID.   Per test claim: PA required; PA submitted to above mentioned insurance via Latent Key/confirmation #/EOC [id number] Status is pending

## 2024-02-08 ENCOUNTER — Other Ambulatory Visit: Payer: Self-pay

## 2024-02-08 ENCOUNTER — Encounter (HOSPITAL_COMMUNITY): Payer: Self-pay

## 2024-02-08 ENCOUNTER — Other Ambulatory Visit (HOSPITAL_COMMUNITY): Payer: Self-pay

## 2024-02-08 NOTE — Telephone Encounter (Signed)
 done

## 2024-02-08 NOTE — Telephone Encounter (Signed)
 Pharmacy Patient Advocate Encounter  Received notification from [facility] MEDICAID that Prior Authorization for Humira  has been APPROVED from 02/07/24 to 02/06/25   PA #/Case ID/Reference #: [id number]

## 2024-02-08 NOTE — Progress Notes (Signed)
 PA approved  Pharmacy Patient Advocate Encounter  Insurance verification completed.   The patient is insured through [facility] MEDICAID   Ran test claim for Humira . Co-pay is $4.  This test claim was processed through [facility]- copay amounts may vary at other pharmacies due to pharmacy/plan contracts, or as the patient moves through the different stages of their insurance plan.

## 2024-02-08 NOTE — Progress Notes (Signed)
 Specialty Pharmacy Initial Fill Coordination Note  Mary Herman is a 63 y.o. female contacted today regarding initial fill of specialty medication(s) Adalimumab  (Humira  (2 Pen))   Patient requested Delivery   Delivery date: 02/10/24   Verified address: [address]   Medication will be filled on: 02/09/24   Patient is aware of $4 copayment.

## 2024-02-08 NOTE — Progress Notes (Signed)
 Specialty Pharmacy Initiation Note   Mary Herman is a 63 y.o. female who will be followed by the specialty pharmacy service for RxSp Rheumatoid Arthritis    Review of administration, indication, effectiveness, safety, potential side effects, storage/disposable, and missed dose instructions occurred today for patient's specialty medication(s) Adalimumab  (Humira  (2 Pen))     Patient/Caregiver did not have any additional questions or concerns.   Patient's therapy is appropriate to: Continue (Patient already established on therapy for many years, transferring pharmacies.)    Goals Addressed             This Visit's Progress    Minimize recurrence of flares       Patient is on track. Patient will maintain adherence. Patient already established on therapy for many years, transferring pharmacies.          Wallie Lagrand M Quamel Fitzmaurice Specialty Pharmacist

## 2024-02-09 ENCOUNTER — Other Ambulatory Visit: Payer: Self-pay

## 2024-02-14 ENCOUNTER — Other Ambulatory Visit: Payer: Self-pay | Admitting: Internal Medicine

## 2024-02-21 ENCOUNTER — Ambulatory Visit: Admitting: Licensed Clinical Social Worker

## 2024-02-21 ENCOUNTER — Other Ambulatory Visit (HOSPITAL_COMMUNITY): Payer: Self-pay

## 2024-02-25 ENCOUNTER — Other Ambulatory Visit: Payer: Self-pay | Admitting: Internal Medicine

## 2024-02-26 ENCOUNTER — Other Ambulatory Visit: Payer: Self-pay | Admitting: Internal Medicine

## 2024-03-01 ENCOUNTER — Other Ambulatory Visit: Payer: Self-pay

## 2024-03-06 ENCOUNTER — Ambulatory Visit: Admitting: Licensed Clinical Social Worker

## 2024-03-06 DIAGNOSIS — F331 Major depressive disorder, recurrent, moderate: Secondary | ICD-10-CM | POA: Diagnosis not present

## 2024-03-06 DIAGNOSIS — F411 Generalized anxiety disorder: Secondary | ICD-10-CM

## 2024-03-06 NOTE — Progress Notes (Signed)
 [facility] Counselor/Therapist Progress Note  Patient ID: Mary Herman, MRN: [id number]    Date: 03/06/2024  Time Spent: 1103  am - 1200 am : 57 Minutes  Treatment Type: Individual Therapy.  Reported Symptoms: Spouse of 30 years passed in February. She reports that she lacks a social support network. Patient reports that he had been ill and disabled. He began passing out and he ended up having a heart attack. Patient reports symptoms of depression and anxiety. Lost job in December.    Mental Status Exam: Appearance:  Casual     Behavior: Appropriate  Motor: Normal  Speech/Language:  Clear and Coherent  Affect: Appropriate  Mood: normal  Thought process: normal  Thought content:   WNL  Sensory/Perceptual disturbances:   WNL  Orientation: oriented to person, place, time/date, situation, day of week, month of year, and year  Attention: Good  Concentration: Good  Memory: WNL  Fund of knowledge:  Good  Insight:   Good  Judgment:  Good  Impulse Control: Good    Risk Assessment: Danger to Self:  No Self-injurious Behavior: No Danger to Others: No Duty to Warn:no Physical Aggression / Violence:No  Access to Firearms a concern: No  Gang Involvement:No    Subjective:    Mary Herman participated in person from office,located at [employer].   Mary Herman consented to treatment. Therapist participated from office.  Mary Herman presented for her session reporting that her daughter had her baby. She reports that her daughter had a hard time and the baby was hospitalized for two weeks due to pneumonia. She reports that the baby comes home. Mary Herman reports that she struggles through the holidays. She reports that it was the holidays and being alone. She reports that she lacks the freedom to speak to her daughter openly. She feels that her daughter is very private and not open with her. Patient reports that is a struggle for her. Patient was transparent about some specific  details of her childhood and how that affects her and how she looks at her relationship with her daughter. She identified being protective and desiring to be an important part of her life.   Clinician actively listened and processed with patient her concerns. We processed her concerns when her daughter was having the baby and she was unaware of what was happening and how that affected her. She reports that once she got to see her daughter and the baby some of the feelings improved. She reports that she believes it was more worry than anything.Patient reports that she is looking forward to spending time with her daughter and the baby once they are home. Patient reports that her depression and anxiety were increased through the holidays and she is certain that it was due to the time of the year and her daughter having the baby.  Clinician actively listened and processed with patient her concerns. Clinician and patient discussed that although things can be scary, we also have a lot of good things to be thankful for. We processed trying to be positive. To be more positive, practice gratitude, surround yourself with uplifting people, and use positive self-talk or affirmations to reframe negative thoughts. Other effective methods include maintaining a healthy lifestyle through exercise and self-care, engaging in activities you enjoy, and focusing on solutions and small wins instead of dwelling on problems.    Mary Herman was fully engaged in discussion with Clinician and was motivated for treatment. Mary Herman is open to feedback and eager to gain insight  into her grief and ways to move forward and build a new normal. Patient is to use CBT, mindfulness and coping skills to help manage decrease symptoms associated with their diagnosis. Treatment planning to be reviewed by 10/12/2024.     Interventions: Cognitive Behavioral Therapy, Dialectical Behavioral Therapy, Mindfulness Meditation, Motivational Interviewing,  Solution-Oriented/Positive Psychology, and Grief Therapy   Diagnosis: Major Depressive Disorder, recurrent moderate, Generalized Anxiety Disorder      Damien Junk MSW, LCSW/DATE 03/06/2024

## 2024-03-10 ENCOUNTER — Other Ambulatory Visit: Payer: Self-pay

## 2024-03-10 NOTE — Progress Notes (Signed)
 Specialty Pharmacy Refill Coordination Note  Mary Herman is a 64 y.o. female contacted today regarding refills of specialty medication(s) Adalimumab  (Humira  (2 Pen))   Patient requested Delivery   Delivery date: 03/28/24   Verified address: [address]   [place] [address]   Medication will be filled on: 03/27/24

## 2024-03-20 ENCOUNTER — Ambulatory Visit: Admitting: Licensed Clinical Social Worker

## 2024-03-23 ENCOUNTER — Other Ambulatory Visit: Payer: Self-pay
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
## 2024-04-03 ENCOUNTER — Ambulatory Visit: Admitting: Licensed Clinical Social Worker

## 2024-04-03 ENCOUNTER — Ambulatory Visit: Admitting: Orthopaedic Surgery

## 2024-04-04 ENCOUNTER — Encounter: Admitting: Registered Nurse

## 2024-04-17 ENCOUNTER — Ambulatory Visit: Admitting: Licensed Clinical Social Worker

## 2024-05-01 ENCOUNTER — Ambulatory Visit: Admitting: Licensed Clinical Social Worker

## 2024-05-15 ENCOUNTER — Ambulatory Visit: Admitting: Licensed Clinical Social Worker

## 2024-05-30 ENCOUNTER — Other Ambulatory Visit: Payer: Self-pay

## 2024-06-02 ENCOUNTER — Encounter: Payer: Self-pay | Admitting: Internal Medicine
